# Patient Record
Sex: Male | Born: 1965 | Race: White | Hispanic: No | Marital: Married | Smoking: Never smoker
Health system: Southern US, Community
[De-identification: ages and names within clinical notes are randomized; demographics above are authoritative.]

## PROBLEM LIST (undated history)

## (undated) DIAGNOSIS — R0789 Other chest pain: Secondary | ICD-10-CM

## (undated) DIAGNOSIS — K602 Anal fissure, unspecified: Secondary | ICD-10-CM

## (undated) DIAGNOSIS — Z9889 Other specified postprocedural states: Secondary | ICD-10-CM

## (undated) DIAGNOSIS — Z789 Other specified health status: Secondary | ICD-10-CM

## (undated) HISTORY — PX: COLONOSCOPY: SHX174

## (undated) HISTORY — DX: Anal fissure, unspecified: K60.2

## (undated) HISTORY — PX: OTHER SURGICAL HISTORY: SHX169

## (undated) HISTORY — DX: Other specified postprocedural states: Z98.890

## (undated) HISTORY — PX: UPPER GASTROINTESTINAL ENDOSCOPY: SHX188

---
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
## 2001-03-24 ENCOUNTER — Ambulatory Visit (HOSPITAL_COMMUNITY): Admission: RE | Admit: 2001-03-24 | Discharge: 2001-03-24 | Payer: Self-pay | Admitting: Internal Medicine

## 2001-03-24 ENCOUNTER — Encounter: Payer: Self-pay | Admitting: Internal Medicine

## 2001-04-19 ENCOUNTER — Ambulatory Visit (HOSPITAL_COMMUNITY): Admission: RE | Admit: 2001-04-19 | Discharge: 2001-04-19 | Payer: Self-pay | Admitting: Internal Medicine

## 2001-08-09 ENCOUNTER — Encounter: Payer: Self-pay | Admitting: Internal Medicine

## 2001-08-09 ENCOUNTER — Ambulatory Visit (HOSPITAL_COMMUNITY): Admission: RE | Admit: 2001-08-09 | Discharge: 2001-08-09 | Payer: Self-pay | Admitting: Internal Medicine

## 2002-03-28 ENCOUNTER — Encounter: Payer: Self-pay | Admitting: Internal Medicine

## 2002-03-28 ENCOUNTER — Ambulatory Visit (HOSPITAL_COMMUNITY): Admission: RE | Admit: 2002-03-28 | Discharge: 2002-03-28 | Payer: Self-pay | Admitting: Internal Medicine

## 2002-04-06 ENCOUNTER — Encounter: Payer: Self-pay | Admitting: Internal Medicine

## 2002-04-06 ENCOUNTER — Ambulatory Visit (HOSPITAL_COMMUNITY): Admission: RE | Admit: 2002-04-06 | Discharge: 2002-04-06 | Payer: Self-pay | Admitting: Internal Medicine

## 2004-08-30 ENCOUNTER — Ambulatory Visit (HOSPITAL_COMMUNITY): Admission: RE | Admit: 2004-08-30 | Discharge: 2004-08-30 | Payer: Self-pay | Admitting: Family Medicine

## 2007-10-05 ENCOUNTER — Ambulatory Visit (HOSPITAL_COMMUNITY): Admission: RE | Admit: 2007-10-05 | Discharge: 2007-10-05 | Payer: Self-pay | Admitting: Family Medicine

## 2010-07-12 NOTE — Op Note (Signed)
[facility] ([place])  Patient:    Justin Estrada, Justin Estrada Visit Number: [id number] MRN: [id number]          Service Type: END Location: DAY Attending Physician:  Jonathon Bellows Dictated by:   Roetta Sessions, M.D. Proc. Date: 04/19/01 Admit Date:  04/19/2001                             Operative Report  PROCEDURE:  Diagnostic esophagogastroduodenoscopy.  ENDOSCOPIST:  Roetta Sessions, M.D.  INDICATION FOR PROCEDURE:  Patient is a 45 year old gentleman with intermittent gnawing epigastric and right upper quadrant fullness.  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  Prior gallbladder EF was in the neighborhood of 75%.  He has not gotten better with OTC H2 blocker therapy.  EGD is now being done to further evaluate his symptoms.  This approach was been discussed with patient at the bedside.  Potential risks, benefits and alternatives have been reviewed and questions answered.  I feel he is low risk for conscious sedation with Versed and Demerol.  Please see my handwritten H&P and the office documentation for more information.  DESCRIPTION OF PROCEDURE:  O2 saturation, blood pressure, pulse and respirations were monitored throughout the entirety of the procedure.  CONSCIOUS SEDATION:  Versed 2 mg IV and Demerol 50 mg IV.  Cetacaine spray for topical oropharyngeal anesthesia.  INSTRUMENT:  Olympus video chip diagnostic gastroscope.  FINDINGS:  Examination of the tubular esophagus revealed no mucosal abnormalities.  EG junction was easily traversed.  Stomach:  The gastric cavity insufflated well with air.  Thorough examination of the gastric mucosa including a retroflexed view of the proximal stomach and esophagogastric junction demonstrated no abnormalities.  Pylorus was patent and easily traversed.  Duodenum:  The bulb and second portion appeared normal.  THERAPEUTIC/DIAGNOSTIC MANEUVERS  PERFORMED:  None.  The patient tolerated the procedure well and was reacted in endoscopy.  IMPRESSION:  Normal esophagus, stomach and duodenum through the second portion.  No endoscopic explanation for patients symptoms.  RECOMMENDATIONS: 1. The patient will need a repeat right upper quadrant ultrasound in three    months to reassess the probable hemangioma seen on prior ultrasound. 2. Would like to give him a course of more aggressive acid-suppression therapy    to see if this has any positive impact on his symptoms.  It may well be    that gallbladder disease is found to ultimately be responsible for his    symptoms. 3. Aciphex 20 mg orally daily for the next month. 4. We will see him back in the office in four weeks and go from there. Dictated by:   Roetta Sessions, M.D. Attending Physician:  Jonathon Bellows DD:  04/19/01 TD:  04/19/01 Job: [id number]

## 2010-07-12 NOTE — Procedures (Signed)
NAME:  Justin Estrada, Justin Estrada NO.:  [id number]   MEDICAL RECORD NO.:  [id number]          PATIENT TYPE:  OUT   LOCATION:  [facility]                          FACILITY:  [facility]   PHYSICIAN:  Scott A. Gerda Diss, MD    DATE OF BIRTH:  August 13, 1965   DATE OF PROCEDURE:  08/30/2004  DATE OF DISCHARGE:                                    STRESS TEST   INDICATIONS FOR PROCEDURE:  Chest discomfort with family history.  Resting  EKG with no acute ST segment changes noted, normal sinus rhythm.   PROTOCOL:  Bruce protocol.   HEART RATE RESPONSE:  The patient's heart rate gradually went up and hit his  target heart rate at the first phase of stage III.  Hit a maximum heart rate  in about the 170s.  The computer said 200, but this was due to artifact.   ST SEGMENT RESPONSE:  The patient did not have any acute ST segment  depressions.  There was a lot of artifact as he went further into his stress  test.  Even at the first recovery EKG, where his heart rate was 161, there  was no acute ST segment depressions.  The patient did have any chest  pressure tightness or discomfort with activity.   RECOVERY PHASE:  Uneventful.  No arrhythmias.   BLOOD PRESSURE RESPONSE:  The patient had a mild increase in blood pressure,  but was not considered to be excessive or hypertensive.   INTERPRETATION:  Normal stress test.  No acute ST segment changes.  He is  approved for regular exercise to help maintain his overall health.  He was  cautioned regarding avoiding excess strenuous activity without training into  it and also his cholesterol for which lab work he brought with him today  looks very good, but a low HDL for which he was advised dietary measures as  well as exercise.  Recheck it again at least on a yearly basis, but  currently does not need medication.       SAL/MEDQ  D:  08/30/2004  T:  08/30/2004  Job:  [id number]

## 2011-01-24 ENCOUNTER — Other Ambulatory Visit: Payer: Self-pay | Admitting: Family Medicine

## 2011-01-28 ENCOUNTER — Ambulatory Visit (HOSPITAL_COMMUNITY)
Admission: RE | Admit: 2011-01-28 | Discharge: 2011-01-28 | Disposition: A | Discharge status: Home / Self Care | Payer: BC Managed Care – PPO | Source: Ambulatory Visit | Attending: Family Medicine | Admitting: Family Medicine

## 2011-01-28 ENCOUNTER — Other Ambulatory Visit: Payer: Self-pay | Admitting: Family Medicine

## 2011-01-28 DIAGNOSIS — R1011 Right upper quadrant pain: Secondary | ICD-10-CM | POA: Insufficient documentation

## 2011-02-11 ENCOUNTER — Other Ambulatory Visit: Payer: Self-pay | Admitting: Family Medicine

## 2011-02-11 DIAGNOSIS — R1011 Right upper quadrant pain: Secondary | ICD-10-CM

## 2011-02-14 ENCOUNTER — Encounter (HOSPITAL_COMMUNITY)
Admission: RE | Admit: 2011-02-14 | Discharge: 2011-02-14 | Disposition: A | Discharge status: Home / Self Care | Payer: BC Managed Care – PPO | Source: Ambulatory Visit | Attending: Family Medicine | Admitting: Family Medicine

## 2011-02-14 ENCOUNTER — Encounter (HOSPITAL_COMMUNITY): Payer: Self-pay

## 2011-02-14 DIAGNOSIS — R1011 Right upper quadrant pain: Secondary | ICD-10-CM | POA: Insufficient documentation

## 2011-02-14 MED ORDER — SINCALIDE 5 MCG IJ SOLR
0.0200 ug/kg | Freq: Once | INTRAMUSCULAR | Status: AC
  Administered 2011-02-14: 2.05 ug via INTRAVENOUS

## 2011-02-14 MED ORDER — TECHNETIUM TC 99M MEBROFENIN IV KIT
5.0000 | PACK | Freq: Once | INTRAVENOUS | Status: AC | PRN
  Administered 2011-02-14: 4.7 via INTRAVENOUS

## 2011-02-14 MED ORDER — SINCALIDE 5 MCG IJ SOLR
INTRAMUSCULAR | Status: AC
  Administered 2011-02-14: 2.05 ug via INTRAVENOUS
  Filled 2011-02-14: qty 5

## 2011-08-04 ENCOUNTER — Ambulatory Visit (INDEPENDENT_AMBULATORY_CARE_PROVIDER_SITE_OTHER): Payer: BC Managed Care – PPO | Admitting: Internal Medicine

## 2011-08-04 ENCOUNTER — Encounter (INDEPENDENT_AMBULATORY_CARE_PROVIDER_SITE_OTHER): Payer: Self-pay | Admitting: Internal Medicine

## 2011-08-04 VITALS — BP 126/60 | HR 66 | Temp 97.7°F | Ht 71.0 in | Wt 230.3 lb

## 2011-08-04 DIAGNOSIS — R52 Pain, unspecified: Secondary | ICD-10-CM

## 2011-08-04 DIAGNOSIS — R1011 Right upper quadrant pain: Secondary | ICD-10-CM | POA: Insufficient documentation

## 2011-08-04 NOTE — Progress Notes (Signed)
Subjective:     Patient ID: Justin Estrada, male   DOB: 05/24/1965, 46 y.o.   MRN: [id number]  HPI Referred to our office by Dr. Gerda Diss for pain  In rt upper quadrant pain.  Pain worse after eating fried foods.  He describes this as discomfort. Pain will radiate into back and under scapular.  Says it is usually with fried foods or if he overeats. He denies acid reflux. Appetite is good. No weight loss. No lower abdominal pain.  BMs x 1 a day. No melena or bright red rectal bleeding.  No change in stools.    EGD in 2003 by Dr Jena Gauss for same: The patient tolerated the procedure well and was reacted in endoscopy.  IMPRESSION: Normal esophagus, stomach and duodenum through the second  portion.  No endoscopic explanation for patients symptoms.     [place] 12/2010 US abdomen: Clinical Data: Right upper quadrant pain  GALLBLADDER ULTRASOUND  Comparison: None  Findings:  Gallbladder: No gallstones, gallbladder wall thickening, or  pericholecystic fluid. Negative sonographic Murphy's sign.  Common Bile Duct: Within normal limits in caliber.  Liver: Non mass-like area of increased echogenicity in the right  hepatic lobe likely represents fatty infiltration.  IMPRESSION: No evidence for acute cholecystitis.  Original Report Authenticated By: Rosealee Albee, M.D.     HIDA scan normal in November of 2012.  05/01/2010 Total bili 0.6, ALP 75, AST 16, ALT 20.    Review of Systems see hpi No current outpatient prescriptions on file.   History reviewed. No pertinent past medical history. Past Surgical History  Procedure Date  . Foot surgery  for bone spurs    Justin Estrada does not currently have medications on file. History   Social History Narrative  . No narrative on file   Family Status  Relation Status Death Age  . Mother Alive     good health  . Father Alive     CAD  . Brother Alive     good health        Objective:   Physical Exam Filed Vitals:   08/04/11 0959  Height: 5'  11" (1.803 m)  Weight: 230 lb 4.8 oz (104.463 kg)  Alert and oriented. Skin warm and dry. Oral mucosa is moist.   . Sclera anicteric, conjunctivae is pink. Thyroid not enlarged. No cervical lymphadenopathy. Lungs clear. Heart regular rate and rhythm.  Abdomen is soft. Bowel sounds are positive. No hepatomegaly. No abdominal masses felt. No tenderness.  No edema to lower extremities. Patient is alert and oriented.       Assessment:   Rt upper quadrant pain for greater than 10 yrs. EGD, HIDA scan and [place] in past have all been normal.  I discussed this case with Dr Karilyn Cota. Gallbladder disease needs to be ruled out.    Plan:    HIDA scan.

## 2011-08-04 NOTE — Patient Instructions (Signed)
HIDA scan.

## 2011-08-04 NOTE — Progress Notes (Signed)
Addended by: Len Blalock on: 08/04/2011 10:44 AM   Modules accepted: Level of Service

## 2011-08-05 ENCOUNTER — Encounter (INDEPENDENT_AMBULATORY_CARE_PROVIDER_SITE_OTHER): Payer: Self-pay

## 2011-08-06 ENCOUNTER — Encounter (HOSPITAL_COMMUNITY)
Admission: RE | Admit: 2011-08-06 | Discharge: 2011-08-06 | Disposition: A | Discharge status: Home / Self Care | Payer: BC Managed Care – PPO | Source: Ambulatory Visit | Attending: Internal Medicine | Admitting: Internal Medicine

## 2011-08-06 ENCOUNTER — Encounter (HOSPITAL_COMMUNITY): Payer: Self-pay

## 2011-08-06 DIAGNOSIS — R1011 Right upper quadrant pain: Secondary | ICD-10-CM

## 2011-08-06 MED ORDER — TECHNETIUM TC 99M MEBROFENIN IV KIT
5.0000 | PACK | Freq: Once | INTRAVENOUS | Status: AC | PRN
Start: 2011-08-06 — End: 2011-08-06
  Administered 2011-08-06: 5 via INTRAVENOUS

## 2011-08-06 MED ORDER — SINCALIDE 5 MCG IJ SOLR
INTRAMUSCULAR | Status: AC
  Administered 2011-08-06: 2.09 ug via INTRAVENOUS
  Filled 2011-08-06: qty 5

## 2012-02-24 ENCOUNTER — Encounter (INDEPENDENT_AMBULATORY_CARE_PROVIDER_SITE_OTHER): Payer: Self-pay | Admitting: Internal Medicine

## 2012-02-24 ENCOUNTER — Ambulatory Visit (INDEPENDENT_AMBULATORY_CARE_PROVIDER_SITE_OTHER): Payer: BC Managed Care – PPO | Admitting: Internal Medicine

## 2012-02-24 VITALS — BP 130/78 | HR 74 | Temp 97.0°F | Resp 18 | Ht 71.0 in | Wt 235.3 lb

## 2012-02-24 DIAGNOSIS — K602 Anal fissure, unspecified: Secondary | ICD-10-CM | POA: Insufficient documentation

## 2012-02-24 MED ORDER — DILTIAZEM GEL 2 %: 1.0000 "application " | Freq: Two times a day (BID) | CUTANEOUS | Status: DC

## 2012-02-24 NOTE — Progress Notes (Signed)
Presenting complaint;  Painful defecation; raw area involving anal canal.  Subjective:  Justin Estrada is 46 year old Caucasian male who was last seen in June 2013 for right upper quadrant pain but workup was negative for biliary tract disease. Ultrasound was negative for cholelithiasis and HIDA scan was normal. Now he comes in with new complaint. For the last few weeks he has noted dry area involving his anal canal and he has painful defecation. He does not recall the day the symptoms started. He was last treated by me for more or less similar symptoms 3 years ago in [facility] [place](these records are not available). He denies diarrhea and/or constipation. He has sporadic hematochezia. He denies frank rectal bleeding melena abdominal pain. He still having intermittent pain under the right rib cage going posteriorly. He is having this pain less frequently and does not last too long. It des not appear to be associated with meals.  Current Medications: No current outpatient prescriptions on file.     Objective: Blood pressure 130/78, pulse 74, temperature 97 F (36.1 C), temperature source Oral, resp. rate 18, height 5\' 11"  (1.803 m), weight 235 lb 4.8 oz (106.731 kg). Conjunctiva is pink. Sclera is nonicteric Oropharyngeal mucosa is normal. No neck masses or thyromegaly noted. Abdomen is full. It is soft and nontender without organomegaly or masses. Rectal examination reveals a tiny, slender skin tacked posteriorly. He has significant tenderness on digital exam which was limited. Mucosa on the glove was heme-positive. No LE edema or clubbing noted.    Assessment:  #1. Symptoms are typical for anal fissure. Luckily he's not having excruciating pain. He does not have chronic diarrhea or other symptoms to suggest IBD. #2. Intermittent right upper quadrant pain. Workup negative for biliary tract disease. It may be musculoskeletal. Will continue to monitor.   Plan:  Continue high  fiber. Diltiazem 2% gel; topical application anal canal twice a day. Progress report in 4 weeks. Office visit in 8 weeks.

## 2012-02-24 NOTE — Patient Instructions (Addendum)
Continue high fiber diet. Call office with progress report in 4 weeks. Heme occult x1.

## 2012-03-30 ENCOUNTER — Encounter (INDEPENDENT_AMBULATORY_CARE_PROVIDER_SITE_OTHER): Payer: Self-pay

## 2012-03-30 ENCOUNTER — Ambulatory Visit (INDEPENDENT_AMBULATORY_CARE_PROVIDER_SITE_OTHER): Payer: BC Managed Care – PPO | Admitting: Internal Medicine

## 2012-04-10 ENCOUNTER — Other Ambulatory Visit: Payer: Self-pay

## 2012-04-20 ENCOUNTER — Ambulatory Visit (INDEPENDENT_AMBULATORY_CARE_PROVIDER_SITE_OTHER): Payer: BC Managed Care – PPO | Admitting: Internal Medicine

## 2012-04-20 ENCOUNTER — Encounter (INDEPENDENT_AMBULATORY_CARE_PROVIDER_SITE_OTHER): Payer: Self-pay | Admitting: Internal Medicine

## 2012-04-20 VITALS — BP 122/78 | HR 74 | Temp 97.0°F | Resp 20 | Ht 71.0 in | Wt 233.8 lb

## 2012-04-20 DIAGNOSIS — K602 Anal fissure, unspecified: Secondary | ICD-10-CM

## 2012-04-20 MED ORDER — DOCUSATE SODIUM 100 MG PO CAPS: 100.0000 mg | ORAL_CAPSULE | Freq: Every day | ORAL | Status: DC

## 2012-04-20 MED ORDER — PSYLLIUM 28 % PO PACK: 1.0000 | PACK | Freq: Every day | ORAL | Status: AC

## 2012-04-20 NOTE — Patient Instructions (Signed)
Discontinue diltiazem gel after 2-3 weeks. Keep symptom diary and call office with progress report in 3 months.

## 2012-04-20 NOTE — Progress Notes (Signed)
Presenting complaint;  Followup for fissure in ano.  Subjective:  Patient is 47 year old Caucasian male who was last seen 8 weeks ago for symptoms secondary to fissure in ano. He was begun on diltiazem gel. He had complete relief of his symptoms for the first 2-3 weeks and now he is having intermittent discomfort with defecation. Discomfort occurs primarily when his stool is somewhat dry or hard. He denies rectal bleeding or discharge. He has had few episodes of right upper quadrant abdominal pain primarily when he stoops forward. He is not having any side effects with diltiazem gel.  Current Medications: Current Outpatient Prescriptions  Medication Sig Dispense Refill  . diltiazem 2 % GEL Apply 1 application topically 2 (two) times daily.  60 g  0   No current facility-administered medications for this visit.     Objective: Blood pressure 122/78, pulse 74, temperature 97 F (36.1 C), temperature source Oral, resp. rate 20, height 5\' 11"  (1.803 m), weight 233 lb 12.8 oz (106.051 kg). Patient is alert and in no acute distress. Conjunctiva is pink. Sclera is nonicteric Oropharyngeal mucosa is normal. No neck masses or thyromegaly noted. Abdomen is full but soft and nontender without organomegaly or masses. No LE edema or clubbing noted.   Assessment:  Fissure in ano. Significant but incomplete response to topical therapy with calcium channel blocker. This may be because he is somewhat constipated.   Plan:  Colace 200 mg by mouth each bedtime. Results 3-4 g by mouth daily. Discontinue diltiazem gel after 2-3 weeks. Progress report in 3 months.

## 2012-07-22 ENCOUNTER — Encounter: Payer: Self-pay | Admitting: Family Medicine

## 2012-07-22 ENCOUNTER — Ambulatory Visit (INDEPENDENT_AMBULATORY_CARE_PROVIDER_SITE_OTHER): Payer: BC Managed Care – PPO | Admitting: Family Medicine

## 2012-07-22 VITALS — BP 114/80 | Temp 97.9°F | Ht 71.0 in | Wt 236.0 lb

## 2012-07-22 DIAGNOSIS — J019 Acute sinusitis, unspecified: Secondary | ICD-10-CM

## 2012-07-22 MED ORDER — HYDROCODONE-HOMATROPINE 5-1.5 MG/5ML PO SYRP: ORAL_SOLUTION | ORAL | Status: DC

## 2012-07-22 MED ORDER — LEVOFLOXACIN 500 MG PO TABS: 500.0000 mg | ORAL_TABLET | Freq: Every day | ORAL | Status: AC

## 2012-07-22 NOTE — Progress Notes (Signed)
  Subjective:    Patient ID: [name], male    DOB: 09-11-1965, 47 y.o.   MRN: [id number]  Sinusitis This is a new problem. The current episode started in the past 7 days. The problem is unchanged. There has been no fever. His pain is at a severity of 5/10. The pain is moderate. Associated symptoms include congestion, coughing, sinus pressure and a sore throat. Treatments tried: otc allergy medicine. The treatment provided no relief.   pmh benign  Not a smoker   Review of Systems  HENT: Positive for congestion, sore throat and sinus pressure.   Respiratory: Positive for cough.        Objective:   Physical Exam  Vitals reviewed. Constitutional: He appears well-nourished.  HENT:  Head: Atraumatic.  Cardiovascular: Normal rate, regular rhythm and normal heart sounds.   No murmur heard. Pulmonary/Chest: Effort normal and breath sounds normal. No respiratory distress.  Lymphadenopathy:    He has no cervical adenopathy.          Assessment & Plan:  acue sinusitis - hycodan at night, levaquin 500 10 days, fu if worse

## 2012-09-09 ENCOUNTER — Ambulatory Visit (INDEPENDENT_AMBULATORY_CARE_PROVIDER_SITE_OTHER): Payer: BC Managed Care – PPO | Admitting: Family Medicine

## 2012-09-09 ENCOUNTER — Ambulatory Visit (HOSPITAL_COMMUNITY)
Admission: RE | Admit: 2012-09-09 | Discharge: 2012-09-09 | Disposition: A | Discharge status: Home / Self Care | Payer: BC Managed Care – PPO | Source: Ambulatory Visit | Attending: Family Medicine | Admitting: Family Medicine

## 2012-09-09 ENCOUNTER — Encounter: Payer: Self-pay | Admitting: Family Medicine

## 2012-09-09 VITALS — BP 120/82 | Temp 97.6°F | Wt 241.5 lb

## 2012-09-09 DIAGNOSIS — M545 Low back pain, unspecified: Secondary | ICD-10-CM | POA: Insufficient documentation

## 2012-09-09 DIAGNOSIS — G8929 Other chronic pain: Secondary | ICD-10-CM

## 2012-09-09 MED ORDER — DICLOFENAC SODIUM 75 MG PO TBEC: 75.0000 mg | DELAYED_RELEASE_TABLET | Freq: Two times a day (BID) | ORAL | Status: DC | PRN

## 2012-09-09 NOTE — Progress Notes (Signed)
  Subjective:    Patient ID: Justin Estrada, male    DOB: [birth year], 47 y.o.   MRN: [id number]  Back Pain This is a chronic problem. The current episode started more than 1 month ago. The problem occurs 2 to 4 times per day. The pain is present in the lumbar spine. The quality of the pain is described as aching. The pain is at a severity of 4/10. The pain is moderate. The pain is worse during the day. The symptoms are aggravated by bending. Stiffness is present in the morning.    Has tried local cream, helps some  Took antiinflam med helped a bit.  Back acts up with exercise. Tired and tight at times  Massage therapy. fam hx of ruptured disc  no reg exercise. Review of Systems  Musculoskeletal: Positive for back pain.   ROS otherwise negative.     Objective:   Physical Exam  Alert no acute distress. Lungs clear. Heart regular rate and rhythm. Left lower lumbar region tender to palpation. Negative spine tenderness. Negative sciatic notch tenderness. Hip good range of motion. Distal legs sensation strength reflexes all intact.      Assessment & Plan:  Impression chronic lumbar strain now worsening. Discussed at length. Plan 1 time physical therapy consult for instruction. Voltaren 75 twice a day with food when necessary for pain. During acute flares. Low back x-rays. Easily 25 minutes spent most in discussion. WSL

## 2012-09-15 ENCOUNTER — Ambulatory Visit (HOSPITAL_COMMUNITY)
Admission: RE | Admit: 2012-09-15 | Discharge: 2012-09-15 | Disposition: A | Discharge status: Home / Self Care | Payer: BC Managed Care – PPO | Source: Ambulatory Visit | Attending: Family Medicine | Admitting: Family Medicine

## 2012-09-15 DIAGNOSIS — IMO0001 Reserved for inherently not codable concepts without codable children: Secondary | ICD-10-CM | POA: Insufficient documentation

## 2012-09-15 DIAGNOSIS — M545 Low back pain, unspecified: Secondary | ICD-10-CM | POA: Insufficient documentation

## 2012-09-15 NOTE — Evaluation (Signed)
Physical Therapy Evaluation  Patient Details  Name: Justin Estrada MRN: [id number] Date of Birth: [birth year] Charge evaluation Today's Date: 09/15/2012 Time: 1520-1600 PT Time Calculation (min): 40 min              Visit#: 1 of 1  Re-eval:   Assessment Diagnosis: Low back pain Prior Therapy: none Past Medical History: No past medical history on file. Past Surgical History:  Past Surgical History  Procedure Laterality Date  . Foot surgery  for bone spurs    . Colonoscopy      10 plus years ago by Dr.Rourk  . Upper gastrointestinal endoscopy      10 plus years ago by Dr.Rourk    Subjective Symptoms/Limitations Symptoms: Mr. Uddin states that his low back has been bothering him for a long time but in the last month the pain has occured on a daily basis.  The pain occures mainly when he is bending.  He states his entire back feels achey and tired.  Pt denies radicular paiin. 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 Leisure: Hobbies-yes (Comment) Comments: coach baseball,   Sensation/Coordination/Flexibility/Functional Tests Flexibility Thomas: Positive 90/90: Positive  Assessment RLE Strength RLE Overall Strength: Within Functional Limits for tasks assessed LLE Strength LLE Overall Strength: Within Functional Limits for tasks assessed Lumbar AROM Lumbar Flexion:  (done with hip flex minimal lumbar motion) Lumbar Extension: wnl Lumbar - Right Side Bend: wnl Lumbar - Left Side Bend: wnl Lumbar - Right Rotation: wnl Lumbar - Left Rotation: wnl  Exercise/Treatments Mobility/Balance  Posture/Postural Control Posture/Postural Control: Postural limitations Postural Limitations: decreased kyphosis incrased lordosis    Stretches Active Hamstring Stretch: 3 reps;30 seconds Single Knee to Chest Stretch: 1 rep;30 seconds Supine Ab Set: 5 reps Dead Bug: 5 reps Bridge: 5 reps Straight Leg Raise: 5 reps Prone  Straight Leg Raise: 5 reps Opposite Arm/Leg Raise: 5 reps   Physical Therapy Assessment and Plan PT Assessment and Plan Clinical Impression Statement: Pt is a 47 yo male with progressive chronic lumbar pain.  Pt has been referred for a one time evaluation. Pt was educated in the importance of posture and body mechanics in low back pain as well as stretches and stab exercises. PT Plan: Pt is a one time visit     Goals Home Exercise Program Pt/caregiver will Perform Home Exercise Program: For increased strengthening;For increased ROM PT Goal: Perform Home Exercise Program - Progress: Goal set today  Problem List Patient Active Problem List   Diagnosis Date Noted  . Chronic low back pain 09/09/2012  . Fissure in ano 02/24/2012  . Abdominal pain, acute, right upper quadrant 08/04/2011    PT Plan of Care PT Home Exercise Plan: given  GP    Ajai Terhaar,CINDY 09/15/2012, 4:03 PM  Physician Documentation Your signature is required to indicate approval of the treatment plan as stated above.  Please sign and either send electronically or make a copy of this report for your files and return this physician signed original.   Please mark one 1.__approve of plan  2. ___approve of plan with the following conditions.   ______________________________  _____________________ Physician Signature                                                                                                             Date

## 2012-11-22 ENCOUNTER — Encounter (INDEPENDENT_AMBULATORY_CARE_PROVIDER_SITE_OTHER): Payer: Self-pay | Admitting: Internal Medicine

## 2012-11-22 ENCOUNTER — Ambulatory Visit (INDEPENDENT_AMBULATORY_CARE_PROVIDER_SITE_OTHER): Payer: BC Managed Care – PPO | Admitting: Internal Medicine

## 2012-11-22 VITALS — BP 118/80 | HR 72 | Temp 97.9°F | Resp 18 | Ht 71.0 in | Wt 233.5 lb

## 2012-11-22 DIAGNOSIS — K602 Anal fissure, unspecified: Secondary | ICD-10-CM

## 2012-11-22 DIAGNOSIS — K5900 Constipation, unspecified: Secondary | ICD-10-CM

## 2012-11-22 MED ORDER — DILTIAZEM GEL 2 %: 1.0000 "application " | Freq: Every day | CUTANEOUS | Status: DC

## 2012-11-22 NOTE — Progress Notes (Signed)
Presenting complaint;  Followup for anal fissure and constipation.  Subjective:  Justin Estrada is a 47 year old Caucasian male who is here for scheduled visit. He was seen in December 2013 for anal fissure and reevaluated in February 2014. He is doing much better. He has very few days when he is constipated or passes hard or dry stool. He spear some pain in her hematochezia and he is constipated. He has good appetite. He is trying to walk few times each week. Last colonoscopy was in 2003.  Current Medications: Current Outpatient Prescriptions  Medication Sig Dispense Refill  . diclofenac (VOLTAREN) 75 MG EC tablet Take 1 tablet (75 mg total) by mouth 2 (two) times daily as needed.  30 tablet  1  . docusate sodium (COLACE) 100 MG capsule Take 1 capsule (100 mg total) by mouth at bedtime.    0  . psyllium (METAMUCIL SMOOTH TEXTURE) 28 % packet Take 1 packet by mouth at bedtime.       No current facility-administered medications for this visit.     Objective: Blood pressure 118/80, pulse 72, temperature 97.9 F (36.6 C), temperature source Oral, resp. rate 18, height 5\' 11"  (1.803 m), weight 233 lb 8 oz (105.915 kg). Conjunctiva is pink. Sclera is nonicteric Oropharyngeal mucosa is normal. No neck masses or thyromegaly noted. Abdomen is full but soft and nontender without organomegaly or masses. Rectal examination was limited to external inspection revealing 2 small anal tags posteriorly on the right side. No ulcer or fissure noted. No LE edema or clubbing noted.   Assessment:  #1. History of anal fissure. He is still having some discomfort and hematochezia and his stool is hard. Overall he is doing much better. He needs to make sure he's never constipated. #2. Constipation. He is doing well with high fiber diet, Metamucil and Colace.   Plan:  New prescription given for diltiazem gel 2% 30 g with one refill he will use on an as-needed basis. Office visit on an as-needed basis. Will  consider colonoscopy when he turns 51.

## 2012-11-22 NOTE — Patient Instructions (Signed)
Continue high fiber diet and fiber supplement. Use diltiazem gel on an as-needed basis as directed

## 2012-12-30 ENCOUNTER — Other Ambulatory Visit: Payer: Self-pay

## 2013-03-21 ENCOUNTER — Encounter: Payer: Self-pay | Admitting: Family Medicine

## 2013-03-21 ENCOUNTER — Ambulatory Visit (INDEPENDENT_AMBULATORY_CARE_PROVIDER_SITE_OTHER): Payer: BC Managed Care – PPO | Admitting: Family Medicine

## 2013-03-21 VITALS — BP 138/84 | Ht 71.0 in | Wt 238.0 lb

## 2013-03-21 DIAGNOSIS — M545 Low back pain, unspecified: Secondary | ICD-10-CM

## 2013-03-21 DIAGNOSIS — G8929 Other chronic pain: Secondary | ICD-10-CM

## 2013-03-21 MED ORDER — DICLOFENAC SODIUM 75 MG PO TBEC: 75.0000 mg | DELAYED_RELEASE_TABLET | Freq: Two times a day (BID) | ORAL | Status: DC | PRN

## 2013-03-21 NOTE — Progress Notes (Signed)
   Subjective:    Patient ID: Justin Estrada, male    DOB: 03/15/1965, 48 y.o.   MRN: [id number]  Back Pain The current episode started more than 1 month ago. The pain is present in the lumbar spine and gluteal. The symptoms are aggravated by bending and sitting. Associated symptoms include tingling. Treatments tried: icy hot, ibuprofen. The treatment provided mild relief.   Pain in groin on right side. Started about 3 weeks ago.    Review of Systems  Musculoskeletal: Positive for back pain.  Neurological: Positive for tingling.       Objective:   Physical Exam Patient has tenderness in the left lower spine area.  Vital area near the L1-L2 region. Negative straight leg raise. Strength is good. Abdomen soft no guarding rebound or tenderness no hernia detected.       Assessment & Plan:  #1 lower back pain discomfort I feel that this is related to possibility of a pinched nerve patient has had this for a good 6 months progressively worse he defers MRI at this point. I doubt surgery necessary. May need an injection that might help him. But for the most part this hopefully will get better by using Voltaren on a regular basis he understands if he starts causing stomach discomfort or bleeding in the stools he is to stop. His are seen a physical therapist he knows about exercises.  Wellness exam recommended later this year  No sign of a hernia. More than likely a strain of the groin ligament

## 2013-04-04 ENCOUNTER — Ambulatory Visit (INDEPENDENT_AMBULATORY_CARE_PROVIDER_SITE_OTHER): Payer: BC Managed Care – PPO | Admitting: Podiatry

## 2013-04-04 ENCOUNTER — Encounter: Payer: Self-pay | Admitting: Podiatry

## 2013-04-04 ENCOUNTER — Ambulatory Visit (INDEPENDENT_AMBULATORY_CARE_PROVIDER_SITE_OTHER): Payer: BC Managed Care – PPO

## 2013-04-04 VITALS — BP 169/82 | HR 64 | Resp 12

## 2013-04-04 DIAGNOSIS — M7750 Other enthesopathy of unspecified foot: Secondary | ICD-10-CM

## 2013-04-04 DIAGNOSIS — R52 Pain, unspecified: Secondary | ICD-10-CM

## 2013-04-04 MED ORDER — TRIAMCINOLONE ACETONIDE 10 MG/ML IJ SUSP
10.0000 mg | Freq: Once | INTRAMUSCULAR | Status: AC
  Administered 2013-04-04: 10 mg

## 2013-04-04 NOTE — Progress Notes (Signed)
   Subjective:    Patient ID: Justin Estrada, male    DOB: 07/05/1965, 48 y.o.   MRN: [id number]  HPI Comments: '' THE OUTSIDE OF THE FEET ARE HURTING AND SWOLLEN FOR 3 WEEKS AND ITS GETTING WORSE. TRIED NO TREATMENT.''  Foot Pain      Review of Systems     Objective:   Physical Exam        Assessment & Plan:

## 2013-04-05 NOTE — Progress Notes (Signed)
Subjective:     Patient ID: Justin Estrada, male   DOB: 08/13/1965, 48 y.o.   MRN: [id number]  HPI patient states to the outside of the left foot has been bothering him and also now it is starting to do it a little bit on the right foot. Also states he's had a history of trouble with his Achilles tendon that flares up occasionally   Review of Systems     Objective:   Physical Exam Neurovascular status intact with significant discomfort at the insertion of peroneal brevis into the base of the fifth metatarsal with fluid buildup around the left foot and mildly on the right foot. No indication of tendon damage and the tendon is functioning well    Assessment:     Acute tendinitis left over right with possibility of systemic issues    Plan:     Reviewed x-ray and did careful injection left peroneal brevis insertion 3 mg Kenalog 5 of Xylocaine Marcaine mixture and discussed we may do blood work if symptoms persist

## 2013-04-20 ENCOUNTER — Encounter: Payer: Self-pay | Admitting: Family Medicine

## 2013-04-20 ENCOUNTER — Ambulatory Visit (INDEPENDENT_AMBULATORY_CARE_PROVIDER_SITE_OTHER): Payer: BC Managed Care – PPO | Admitting: Family Medicine

## 2013-04-20 VITALS — BP 130/82 | Ht 71.0 in | Wt 243.2 lb

## 2013-04-20 DIAGNOSIS — Z23 Encounter for immunization: Secondary | ICD-10-CM

## 2013-04-20 DIAGNOSIS — Z0000 Encounter for general adult medical examination without abnormal findings: Secondary | ICD-10-CM

## 2013-04-20 NOTE — Progress Notes (Signed)
   Subjective:    Patient ID: Justin Estrada, male    DOB: 08/04/1965, 48 y.o.   MRN: [id number]  HPI Patient arrives for an annual physical. Patient reports no problems or concerns at this time.  Patient does try to keep somewhat healthy but he states he can do much better job he also does try to exercise a little but he has not past few months he states he gets a fair amount of sleep he denies poor driving habits denies rectal bleeding vomiting diarrhea denies chest pain shortness of breath past medical history other than some low back pain and intermittent abdominal discomfort he is fine Review of Systems  Constitutional: Negative for fever, activity change and appetite change.  HENT: Negative for congestion and rhinorrhea.   Eyes: Negative for discharge.  Respiratory: Negative for cough and wheezing.   Cardiovascular: Negative for chest pain.  Gastrointestinal: Negative for vomiting, abdominal pain and blood in stool.  Genitourinary: Negative for frequency and difficulty urinating.  Musculoskeletal: Negative for neck pain.  Skin: Negative for rash.  Allergic/Immunologic: Negative for environmental allergies and food allergies.  Neurological: Negative for weakness and headaches.  Psychiatric/Behavioral: Negative for agitation.       Objective:   Physical Exam  Constitutional: He appears well-developed and well-nourished.  HENT:  Head: Normocephalic and atraumatic.  Right Ear: External ear normal.  Left Ear: External ear normal.  Nose: Nose normal.  Mouth/Throat: Oropharynx is clear and moist.  Eyes: EOM are normal. Pupils are equal, round, and reactive to light.  Neck: Normal range of motion. Neck supple. No thyromegaly present.  Cardiovascular: Normal rate, regular rhythm and normal heart sounds.   No murmur heard. Pulmonary/Chest: Effort normal and breath sounds normal. No respiratory distress. He has no wheezes.  Abdominal: Soft. Bowel sounds are normal. He exhibits no  distension and no mass. There is no tenderness.  Genitourinary: Penis normal.  Musculoskeletal: Normal range of motion. He exhibits no edema.  Lymphadenopathy:    He has no cervical adenopathy.  Neurological: He is alert. He exhibits normal muscle tone.  Skin: Skin is warm and dry. No erythema.  Psychiatric: He has a normal mood and affect. His behavior is normal. Judgment normal.          Assessment & Plan:  Overall his physical exam is normal, he needs to lose weight we talked at length about proper diet regular exercise bring his weight under 220. 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

## 2013-07-25 ENCOUNTER — Telehealth: Payer: Self-pay | Admitting: Family Medicine

## 2013-07-25 NOTE — Telephone Encounter (Signed)
Please let the patient know that his lab work overall shows slight elevation in triglycerides HDL slightly low best approach is regular physical activity and healthy diet. No medications indicated. He should repeat this again in one years time. Prostate was normal.

## 2013-07-25 NOTE — Telephone Encounter (Signed)
Patient had recent labs done.

## 2013-07-26 NOTE — Telephone Encounter (Signed)
[facility] 07/26/13

## 2013-07-26 NOTE — Telephone Encounter (Signed)
Notified patient know that his lab work overall shows slight elevation in triglycerides HDL slightly low best approach is regular physical activity and healthy diet. No medications indicated. He should repeat this again in one years time. Prostate was normal. Patient verbalized understanding.

## 2014-12-11 ENCOUNTER — Encounter (INDEPENDENT_AMBULATORY_CARE_PROVIDER_SITE_OTHER): Payer: Self-pay | Admitting: Internal Medicine

## 2014-12-11 ENCOUNTER — Ambulatory Visit (INDEPENDENT_AMBULATORY_CARE_PROVIDER_SITE_OTHER): Payer: BC Managed Care – PPO | Admitting: Internal Medicine

## 2014-12-11 VITALS — BP 108/82 | HR 68 | Temp 97.7°F | Resp 18 | Ht 71.0 in | Wt 239.0 lb

## 2014-12-11 DIAGNOSIS — Z8719 Personal history of other diseases of the digestive system: Secondary | ICD-10-CM

## 2014-12-11 DIAGNOSIS — K602 Anal fissure, unspecified: Secondary | ICD-10-CM | POA: Diagnosis not present

## 2014-12-11 DIAGNOSIS — K5900 Constipation, unspecified: Secondary | ICD-10-CM

## 2014-12-11 MED ORDER — DILTIAZEM GEL 2 %: 1.0000 "application " | Freq: Every day | CUTANEOUS | Status: DC

## 2014-12-11 NOTE — Progress Notes (Signed)
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  Subjective:  Justin Estrada is a 49 year old Caucasian male who has history of anal fissure and constipation and is here for scheduled visit. He was last seen in September 2014. He states he is doing well. He has not used diltiazem gel in over 3 months. His prescription has expired and he would like to get a new prescription. He uses it on as-needed basis and usually for 3 days in a row. If he uses more than 3 days he develops itching. Only time he has discomfort as if he forgets to take Colace and stool comes heart. He denies sharp pain or rectal bleeding. He remains with good appetite. He is trying to walk more than he has and has lost 4 pounds. He states he takes diclofenac infrequently. He has not experienced any side effects with this medication. Last colonoscopy was more than 10 years ago.   Current Medications: Outpatient Encounter Prescriptions as of 12/11/2014  Medication Sig  . diclofenac (VOLTAREN) 75 MG EC tablet Take 1 tablet (75 mg total) by mouth 2 (two) times daily as needed.  . diltiazem 2 % GEL Apply 1 application topically at bedtime. Use as directed.  . docusate sodium (COLACE) 100 MG capsule Take 1 capsule (100 mg total) by mouth at bedtime.  . psyllium (METAMUCIL SMOOTH TEXTURE) 28 % packet Take 1 packet by mouth at bedtime.   No facility-administered encounter medications on file as of 12/11/2014.     Objective: Blood pressure 108/82, pulse 68, temperature 97.7 F (36.5 C), temperature source Oral, resp. rate 18, height 5\' 11"  (1.803 m), weight 239 lb (108.41 kg). Patient is alert and in no acute distress. Conjunctiva is pink. Sclera is nonicteric Oropharyngeal mucosa is normal. No neck masses or thyromegaly noted. Cardiac exam with regular rhythm normal S1 and S2. No murmur or gallop noted. Lungs are clear to auscultation. Abdomen is protuberant but soft and nontender without organomegaly or  masses.  Rectal examination is limited to external inspection and he has small skin tacked posteriorly which is very soft and nontender. No LE edema or clubbing noted.   Assessment:  #1. History of anal fissure. He is having sporadic discomfort but I do not believe he has chronic anal fissure. He will continue to use diltiazem gel on as-needed basis. #2. Constipation. He is doing well with dietary measures stool softer and fiber supplement.   Plan:  New prescription given for diltiazem gel 2% to be used on as-needed basis. Will retrieve prior colonoscopy records. Office visit on as-needed basis. Average risk screening colonoscopy after 50[age] birthday.

## 2014-12-11 NOTE — Patient Instructions (Signed)
Use diltiazem gel as directed. Screening colonoscopy after 50[age] birthday.

## 2015-02-06 ENCOUNTER — Encounter: Payer: Self-pay | Admitting: Family Medicine

## 2015-02-06 ENCOUNTER — Ambulatory Visit (INDEPENDENT_AMBULATORY_CARE_PROVIDER_SITE_OTHER): Payer: BC Managed Care – PPO | Admitting: Family Medicine

## 2015-02-06 VITALS — BP 114/80 | Temp 97.7°F | Ht 71.0 in | Wt 241.0 lb

## 2015-02-06 DIAGNOSIS — J069 Acute upper respiratory infection, unspecified: Secondary | ICD-10-CM | POA: Diagnosis not present

## 2015-02-06 DIAGNOSIS — J019 Acute sinusitis, unspecified: Secondary | ICD-10-CM

## 2015-02-06 DIAGNOSIS — B9689 Other specified bacterial agents as the cause of diseases classified elsewhere: Secondary | ICD-10-CM

## 2015-02-06 MED ORDER — HYDROCODONE-HOMATROPINE 5-1.5 MG/5ML PO SYRP: 5.0000 mL | ORAL_SOLUTION | Freq: Four times a day (QID) | ORAL | Status: DC | PRN

## 2015-02-06 MED ORDER — AMOXICILLIN 500 MG PO TABS
500.0000 mg | ORAL_TABLET | Freq: Three times a day (TID) | ORAL | Status: DC
Start: 2015-02-06 — End: 2015-03-07

## 2015-02-06 NOTE — Progress Notes (Signed)
   Subjective:    Patient ID: Justin Estrada, male    DOB: 08/04/1965, 49 y.o.   MRN: [id number]  Sinusitis This is a new problem. The current episode started in the past 7 days. The problem has been gradually worsening since onset. There has been no fever. The pain is moderate. Associated symptoms include congestion and coughing. Pertinent negatives include no ear pain. Past treatments include oral decongestants. The treatment provided mild relief.   Patient states that he has no other concerns at this time.  PMH benign  Review of Systems  Constitutional: Negative for fever and activity change.  HENT: Positive for congestion and rhinorrhea. Negative for ear pain.   Eyes: Negative for discharge.  Respiratory: Positive for cough. Negative for wheezing.   Cardiovascular: Negative for chest pain.       Objective:   Physical Exam  Constitutional: He appears well-developed.  HENT:  Head: Normocephalic.  Mouth/Throat: Oropharynx is clear and moist. No oropharyngeal exudate.  Neck: Normal range of motion.  Cardiovascular: Normal rate, regular rhythm and normal heart sounds.   No murmur heard. Pulmonary/Chest: Effort normal and breath sounds normal. He has no wheezes.  Lymphadenopathy:    He has no cervical adenopathy.  Neurological: He exhibits normal muscle tone.  Skin: Skin is warm and dry.  Nursing note and vitals reviewed.         Assessment & Plan:   viral syndrome Secondary rhinosinusitis Patient was seen today for upper respiratory illness. It is felt that the patient is dealing with sinusitis. Antibiotics were prescribed today. Importance of compliance with medication was discussed. Symptoms should gradually resolve over the course of the next several days. If high fevers, progressive illness, difficulty breathing, worsening condition or failure for symptoms to improve over the next several days then the patient is to follow-up. If any emergent conditions the patient is to  follow-up in the emergency department otherwise to follow-up in the office.

## 2015-02-20 ENCOUNTER — Ambulatory Visit (INDEPENDENT_AMBULATORY_CARE_PROVIDER_SITE_OTHER): Payer: BC Managed Care – PPO | Admitting: Family Medicine

## 2015-02-20 ENCOUNTER — Encounter: Payer: Self-pay | Admitting: Family Medicine

## 2015-02-20 VITALS — BP 138/82 | Temp 98.4°F | Ht 71.0 in | Wt 231.8 lb

## 2015-02-20 DIAGNOSIS — B9689 Other specified bacterial agents as the cause of diseases classified elsewhere: Secondary | ICD-10-CM

## 2015-02-20 DIAGNOSIS — J019 Acute sinusitis, unspecified: Secondary | ICD-10-CM | POA: Diagnosis not present

## 2015-02-20 MED ORDER — BENZONATATE 200 MG PO CAPS: 200.0000 mg | ORAL_CAPSULE | Freq: Three times a day (TID) | ORAL | Status: DC | PRN

## 2015-02-20 MED ORDER — AZITHROMYCIN 250 MG PO TABS: ORAL_TABLET | ORAL | Status: DC

## 2015-02-20 NOTE — Progress Notes (Signed)
   Subjective:    Patient ID: Justin Estrada, male    DOB: 03/21/1965, 49 y.o.   MRN: [id number]  Cough This is a new problem. The current episode started 1 to 4 weeks ago. Associated symptoms include nasal congestion, rhinorrhea and a sore throat. Pertinent negatives include no chest pain, ear pain, fever or wheezing. Treatments tried: sudafed , amoxil.     PMH recent sinus infection was on antibiotics got better but then this relapsed over the past several days with sinus pressure and pain  Review of Systems  Constitutional: Negative for fever and activity change.  HENT: Positive for congestion, rhinorrhea and sore throat. Negative for ear pain.   Eyes: Negative for discharge.  Respiratory: Positive for cough. Negative for wheezing.   Cardiovascular: Negative for chest pain.       Objective:   Physical Exam  Constitutional: He appears well-developed.  HENT:  Head: Normocephalic.  Mouth/Throat: Oropharynx is clear and moist. No oropharyngeal exudate.  Neck: Normal range of motion.  Cardiovascular: Normal rate, regular rhythm and normal heart sounds.   No murmur heard. Pulmonary/Chest: Effort normal and breath sounds normal. He has no wheezes.  Lymphadenopathy:    He has no cervical adenopathy.  Neurological: He exhibits normal muscle tone.  Skin: Skin is warm and dry.  Nursing note and vitals reviewed.         Assessment & Plan:   viral syndrome Secondary rhinosinusitis Patient was seen today for upper respiratory illness. It is felt that the patient is dealing with sinusitis. Antibiotics were prescribed today. Importance of compliance with medication was discussed. Symptoms should gradually resolve over the course of the next several days. If high fevers, progressive illness, difficulty breathing, worsening condition or failure for symptoms to improve over the next several days then the patient is to follow-up. If any emergent conditions the patient is to follow-up in the  emergency department otherwise to follow-up in the office.

## 2015-03-07 ENCOUNTER — Ambulatory Visit (INDEPENDENT_AMBULATORY_CARE_PROVIDER_SITE_OTHER): Payer: BC Managed Care – PPO | Admitting: Family Medicine

## 2015-03-07 ENCOUNTER — Encounter: Payer: Self-pay | Admitting: Family Medicine

## 2015-03-07 VITALS — BP 118/70 | Temp 98.3°F | Ht 71.0 in | Wt 237.5 lb

## 2015-03-07 DIAGNOSIS — J019 Acute sinusitis, unspecified: Secondary | ICD-10-CM

## 2015-03-07 DIAGNOSIS — B9689 Other specified bacterial agents as the cause of diseases classified elsewhere: Secondary | ICD-10-CM

## 2015-03-07 MED ORDER — LEVOFLOXACIN 500 MG PO TABS: 500.0000 mg | ORAL_TABLET | Freq: Every day | ORAL | Status: DC

## 2015-03-07 NOTE — Progress Notes (Signed)
   Subjective:    Patient ID: Justin LynchJames T Montecalvo, male    DOB: 11/08/1965, 50 y.o.   MRN: [id number]  Cough This is a new problem. The current episode started 1 to 4 weeks ago. The problem has been unchanged. The cough is productive of sputum. Associated symptoms include rhinorrhea. Pertinent negatives include no chest pain, ear pain, fever or wheezing. Associated symptoms comments: congestion. Nothing aggravates the symptoms. Treatments tried: antibiotics. The treatment provided no relief.    Patient relates head congestion drainage coughing sinus pressure denies high fever chills  Review of Systems  Constitutional: Negative for fever and activity change.  HENT: Positive for congestion and rhinorrhea. Negative for ear pain.   Eyes: Negative for discharge.  Respiratory: Positive for cough. Negative for wheezing.   Cardiovascular: Negative for chest pain.       Objective:   Physical Exam  Constitutional: He appears well-developed.  HENT:  Head: Normocephalic.  Mouth/Throat: Oropharynx is clear and moist. No oropharyngeal exudate.  Neck: Normal range of motion.  Cardiovascular: Normal rate, regular rhythm and normal heart sounds.   No murmur heard. Pulmonary/Chest: Effort normal and breath sounds normal. He has no wheezes.  Lymphadenopathy:    He has no cervical adenopathy.  Neurological: He exhibits normal muscle tone.  Skin: Skin is warm and dry.  Nursing note and vitals reviewed.   Prolonged illness over the past several weeks sinus involvement and some bronchiolar involvement one additional round of antibiotics should be helpful      Assessment & Plan:  Patient was seen today for upper respiratory illness. It is felt that the patient is dealing with sinusitis. Antibiotics were prescribed today. Importance of compliance with medication was discussed. Symptoms should gradually resolve over the course of the next several days. If high fevers, progressive illness, difficulty  breathing, worsening condition or failure for symptoms to improve over the next several days then the patient is to follow-up. If any emergent conditions the patient is to follow-up in the emergency department otherwise to follow-up in the office.

## 2015-03-15 ENCOUNTER — Telehealth: Payer: Self-pay | Admitting: Family Medicine

## 2015-03-15 NOTE — Telephone Encounter (Signed)
Pt states on VM left here that you told him to call here anytime he had a  Question, he did not elaborate as to what the question was but asked for  You to give him a call.  Call was placed here at 3:43 yesterday evening.  Call his cell or home I am assuming

## 2015-03-20 NOTE — Telephone Encounter (Signed)
I did speak with the patient he was having some shortness and pains in the groin region after doing stretching got better with anti-inflammatory he will follow-up if ongoing troubles

## 2015-03-23 ENCOUNTER — Telehealth: Payer: Self-pay | Admitting: Family Medicine

## 2015-03-23 DIAGNOSIS — R5383 Other fatigue: Secondary | ICD-10-CM

## 2015-03-23 DIAGNOSIS — Z13220 Encounter for screening for lipoid disorders: Secondary | ICD-10-CM

## 2015-03-23 DIAGNOSIS — Z79899 Other long term (current) drug therapy: Secondary | ICD-10-CM

## 2015-03-23 MED ORDER — DICLOFENAC SODIUM 75 MG PO TBEC: 75.0000 mg | DELAYED_RELEASE_TABLET | Freq: Two times a day (BID) | ORAL | Status: DC | PRN

## 2015-03-23 NOTE — Telephone Encounter (Signed)
The patient may have 60 tablets with one refill, please let the patient know that we would like for him to do a wellness check this spring

## 2015-03-23 NOTE — Telephone Encounter (Signed)
Pt is needing refills on his diclofenac his has expired.     [facility]

## 2015-03-23 NOTE — Telephone Encounter (Signed)
No med check in over 2 years. Last physical 04/20/13

## 2015-03-23 NOTE — Telephone Encounter (Signed)
[facility] 03/23/15 

## 2015-03-23 NOTE — Telephone Encounter (Signed)
Lipid, liver, metabolic 7, CBC 

## 2015-03-23 NOTE — Telephone Encounter (Signed)
Pt notified script sent and transferred to front to schedule an ov.

## 2015-03-23 NOTE — Telephone Encounter (Signed)
Pt is requesting lab orders to be sent over for an appt in March. There are no previous labs in Epic.

## 2015-03-23 NOTE — Telephone Encounter (Signed)
Orders ready.pt notified on vm.

## 2015-04-20 ENCOUNTER — Encounter: Payer: Self-pay | Admitting: Family Medicine

## 2015-04-20 ENCOUNTER — Ambulatory Visit (INDEPENDENT_AMBULATORY_CARE_PROVIDER_SITE_OTHER): Payer: BC Managed Care – PPO | Admitting: Family Medicine

## 2015-04-20 VITALS — BP 110/78 | Ht 71.0 in | Wt 230.0 lb

## 2015-04-20 DIAGNOSIS — M25551 Pain in right hip: Secondary | ICD-10-CM

## 2015-04-20 NOTE — Progress Notes (Signed)
   Subjective:    Patient ID: Justin Estrada, male    DOB: 1965/05/15, 50 y.o.   MRN: [id number]  Hip Pain  Incident onset: 2 weeks ago. Pain location: right hip, groin area. Treatments tried: diclofenac. The treatment provided moderate relief.   This is been going on over the past few weeks he describes aching in the discomfort he was hesitant to take the anti-inflammatory because he thought it was getting interfere with his stomach he denies any rectal bleeding or vomiting. Denies any sciatica symptoms.   Review of Systems See above.    Objective:   Physical Exam  Lungs clear abdomen soft flanks nontender good internal/external rotation of the right hip with some tenderness near the trochanter region on the right side low back nontender negative straight leg raise      Assessment & Plan:   hip discomfort I believe that this is more of a ligamentous or possibly a bursitis issue it is fine for him to take the anti-inflammatory twice a day over the course of the next 3-4 weeks I recommend that if he is not doing better by the time he follows up we'll refer him for physical therapy possibly orthopedics if he is having worsening trouble or ongoing trouble x-rays of the hip would be indicated  Lab work in wellness visit in a few weeks

## 2015-04-20 NOTE — Patient Instructions (Signed)
Use Voltaren 2 times a day for the next few weeks  Do your labs

## 2015-04-25 ENCOUNTER — Telehealth: Payer: Self-pay | Admitting: Family Medicine

## 2015-04-25 ENCOUNTER — Encounter: Payer: Self-pay | Admitting: Family Medicine

## 2015-04-25 ENCOUNTER — Ambulatory Visit (INDEPENDENT_AMBULATORY_CARE_PROVIDER_SITE_OTHER): Payer: BC Managed Care – PPO | Admitting: Family Medicine

## 2015-04-25 VITALS — BP 132/88 | Temp 98.4°F | Wt 233.0 lb

## 2015-04-25 DIAGNOSIS — K6289 Other specified diseases of anus and rectum: Secondary | ICD-10-CM

## 2015-04-25 MED ORDER — CIPROFLOXACIN HCL 500 MG PO TABS: 500.0000 mg | ORAL_TABLET | Freq: Two times a day (BID) | ORAL | Status: DC

## 2015-04-25 NOTE — Progress Notes (Signed)
   Subjective:    Patient ID: Justin Estrada, male    DOB: 04/22/1965, 50 y.o.   MRN: [id number]  HPIPainful spot on buttock. Hard to sit. noticied it 3 days ago. Patient denies high fever chills nausea vomiting diarrhea. Relates history of hemorrhoids also relates history skin tags in addition to this having some tenderness around the rectum over the past few days difficult to sit on denies vomiting diarrhea or bloody stools no respiratory symptoms   Review of Systems     Objective:   Physical Exam  Patient has erythematous area near the rectum and does not appear to be fluctuant he also has what appears to be a developing hemorrhoid in the region. Also with skin tags although that does not seem to be causing any issue. Patient relates tenderness to palpation  Patient is do warm compresses 20 minutes at a time at least 4 times per day ibuprofen for pain if worse let us know    Assessment & Plan:  What appears to be a possibility of cellulitis versus a developing abscess near the rectum-Cipro 500 twice a day over the course of next 2 weeks if not seen significant improvement over the next 3-4 days along with warm compresses patient needs to let us know we will help set him up with general surgery.

## 2015-04-25 NOTE — Telephone Encounter (Signed)
error 

## 2015-05-16 LAB — CBC WITH DIFFERENTIAL/PLATELET
BASOS ABS: 0.1 10*3/uL (ref 0.0–0.2)
Basos: 1 %
EOS (ABSOLUTE): 0.1 10*3/uL (ref 0.0–0.4)
Eos: 1 %
Hematocrit: 47.8 % (ref 37.5–51.0)
Hemoglobin: 16.1 g/dL (ref 12.6–17.7)
Immature Grans (Abs): 0 10*3/uL (ref 0.0–0.1)
Immature Granulocytes: 0 %
LYMPHS ABS: 1.6 10*3/uL (ref 0.7–3.1)
Lymphs: 28 %
MCH: 30.2 pg (ref 26.6–33.0)
MCHC: 33.7 g/dL (ref 31.5–35.7)
MCV: 90 fL (ref 79–97)
MONOS ABS: 0.6 10*3/uL (ref 0.1–0.9)
Monocytes: 10 %
NEUTROS ABS: 3.4 10*3/uL (ref 1.4–7.0)
Neutrophils: 60 %
PLATELETS: 297 10*3/uL (ref 150–379)
RBC: 5.33 x10E6/uL (ref 4.14–5.80)
RDW: 13.2 % (ref 12.3–15.4)
WBC: 5.8 10*3/uL (ref 3.4–10.8)

## 2015-05-16 LAB — BASIC METABOLIC PANEL
BUN / CREAT RATIO: 14 (ref 9–20)
BUN: 15 mg/dL (ref 6–24)
CALCIUM: 9.6 mg/dL (ref 8.7–10.2)
CHLORIDE: 107 mmol/L — AB (ref 96–106)
CO2: 23 mmol/L (ref 18–29)
Creatinine, Ser: 1.04 mg/dL (ref 0.76–1.27)
GFR calc non Af Amer: 85 mL/min/{1.73_m2} (ref >59)
GFR, EST AFRICAN AMERICAN: 98 mL/min/{1.73_m2} (ref >59)
Glucose: 95 mg/dL (ref 65–99)
POTASSIUM: 5.2 mmol/L (ref 3.5–5.2)
Sodium: 147 mmol/L — ABNORMAL HIGH (ref 134–144)

## 2015-05-16 LAB — HEPATIC FUNCTION PANEL
ALT: 19 IU/L (ref 0–44)
AST: 15 IU/L (ref 0–40)
Albumin: 4.6 g/dL (ref 3.5–5.5)
Alkaline Phosphatase: 70 IU/L (ref 39–117)
BILIRUBIN TOTAL: 0.8 mg/dL (ref 0.0–1.2)
Bilirubin, Direct: 0.22 mg/dL (ref 0.00–0.40)
Total Protein: 6.8 g/dL (ref 6.0–8.5)

## 2015-05-16 LAB — LIPID PANEL
CHOLESTEROL TOTAL: 151 mg/dL (ref 100–199)
Chol/HDL Ratio: 4.4 ratio units (ref 0.0–5.0)
HDL: 34 mg/dL — ABNORMAL LOW (ref >39)
LDL CALC: 93 mg/dL (ref 0–99)
TRIGLYCERIDES: 121 mg/dL (ref 0–149)
VLDL Cholesterol Cal: 24 mg/dL (ref 5–40)

## 2015-05-18 ENCOUNTER — Ambulatory Visit (INDEPENDENT_AMBULATORY_CARE_PROVIDER_SITE_OTHER): Payer: BC Managed Care – PPO | Admitting: Family Medicine

## 2015-05-18 ENCOUNTER — Encounter: Payer: Self-pay | Admitting: Family Medicine

## 2015-05-18 VITALS — BP 122/80 | Ht 70.5 in | Wt 233.5 lb

## 2015-05-18 DIAGNOSIS — M25551 Pain in right hip: Secondary | ICD-10-CM

## 2015-05-18 DIAGNOSIS — Z0000 Encounter for general adult medical examination without abnormal findings: Secondary | ICD-10-CM

## 2015-05-18 NOTE — Progress Notes (Signed)
   Subjective:    Patient ID: Justin Estrada, male    DOB: 01/13/1966, 50 y.o.   MRN: [id number]  HPI The patient comes in today for a wellness visit.    A review of their health history was completed.  A review of medications was also completed.  Any needed refills: N/A  Eating habits: good  Falls/  MVA accidents in past few months: none  Regular exercise: yes  Specialist pt sees on regular basis: none  Preventative health issues were discussed.   Additional concerns: none   Review of Systems  Constitutional: Negative for activity change, appetite change and fatigue.  HENT: Negative for congestion.   Respiratory: Negative for cough.   Cardiovascular: Negative for chest pain.  Gastrointestinal: Negative for abdominal pain.  Endocrine: Negative for polydipsia and polyphagia.  Neurological: Negative for weakness.  Psychiatric/Behavioral: Negative for confusion.   Tenderness in the anterior right hip right groin region    Objective:   Physical Exam  Constitutional: He appears well-developed and well-nourished.  HENT:  Head: Normocephalic and atraumatic.  Right Ear: External ear normal.  Left Ear: External ear normal.  Nose: Nose normal.  Mouth/Throat: Oropharynx is clear and moist.  Eyes: EOM are normal. Pupils are equal, round, and reactive to light.  Neck: Normal range of motion. Neck supple. No thyromegaly present.  Cardiovascular: Normal rate, regular rhythm and normal heart sounds.   No murmur heard. Pulmonary/Chest: Effort normal and breath sounds normal. No respiratory distress. He has no wheezes.  Abdominal: Soft. Bowel sounds are normal. He exhibits no distension and no mass. There is no tenderness.  Genitourinary: Penis normal.  Musculoskeletal: Normal range of motion. He exhibits no edema.  Lymphadenopathy:    He has no cervical adenopathy.  Neurological: He is alert. He exhibits normal muscle tone.  Skin: Skin is warm and dry. No erythema.    Psychiatric: He has a normal mood and affect. His behavior is normal. Judgment normal.          Assessment & Plan:  Right hip right groin tenderness and pain patient was seen for this just recently we will refer to orthopedics for further evaluation  Safety dietary measures discussed importance of losing weight discuss lab work overall looks good this was reviewed with patient  Follow-up wellness in one year

## 2015-05-22 ENCOUNTER — Ambulatory Visit (INDEPENDENT_AMBULATORY_CARE_PROVIDER_SITE_OTHER): Payer: BC Managed Care – PPO | Admitting: Internal Medicine

## 2015-05-23 ENCOUNTER — Encounter: Payer: Self-pay | Admitting: Family Medicine

## 2015-09-06 ENCOUNTER — Other Ambulatory Visit: Payer: Self-pay | Admitting: Sports Medicine

## 2015-09-06 DIAGNOSIS — M25551 Pain in right hip: Secondary | ICD-10-CM

## 2015-09-18 ENCOUNTER — Ambulatory Visit
Admission: RE | Admit: 2015-09-18 | Discharge: 2015-09-18 | Disposition: A | Discharge status: Home / Self Care | Payer: BC Managed Care – PPO | Source: Ambulatory Visit | Attending: Sports Medicine | Admitting: Sports Medicine

## 2015-09-18 DIAGNOSIS — M25551 Pain in right hip: Secondary | ICD-10-CM

## 2015-09-18 MED ORDER — IOPAMIDOL (ISOVUE-M 200) INJECTION 41%
15.0000 mL | Freq: Once | INTRAMUSCULAR | Status: AC
  Administered 2015-09-18: 15 mL via INTRA_ARTICULAR

## 2015-12-07 ENCOUNTER — Other Ambulatory Visit: Payer: Self-pay | Admitting: Family Medicine

## 2015-12-21 ENCOUNTER — Ambulatory Visit (INDEPENDENT_AMBULATORY_CARE_PROVIDER_SITE_OTHER): Payer: BC Managed Care – PPO | Admitting: Family Medicine

## 2015-12-21 ENCOUNTER — Encounter: Payer: Self-pay | Admitting: Family Medicine

## 2015-12-21 VITALS — BP 124/86 | Temp 98.1°F | Ht 70.5 in | Wt 237.2 lb

## 2015-12-21 DIAGNOSIS — J392 Other diseases of pharynx: Secondary | ICD-10-CM | POA: Diagnosis not present

## 2015-12-21 NOTE — Progress Notes (Signed)
   Subjective:    Patient ID: Justin Estrada, male    DOB: 01/22/1966, 50 y.o.   MRN: [id number]  Sore Throat   This is a new problem. The current episode started in the past 7 days. The pain is worse on the right side. He has tried nothing for the symptoms.  Patient relates the pain is on the right side down low near the level of the thyroid and focal cords he denies choking on anything he denies any tightness pain discomfort. Denies sweats or chills  States no other concerns this visit.   Review of Systems No wheezing fever coughing denies swelling in the pharyngeal area denies difficulty swallowing    Objective:   Physical Exam Lungs are clear hearts regular throat non-erythematous neck no masses no tenderness subjected discomfort right midneck near the laryngeal pharyngeal region       Assessment & Plan:  Pharyngeal pain on the right side no sign of any mass or growth I recommend supportive measures anti-inflammatories if not doing significantly better within the next week notify us and we will set him up with ENT

## 2016-01-03 ENCOUNTER — Ambulatory Visit (INDEPENDENT_AMBULATORY_CARE_PROVIDER_SITE_OTHER): Payer: BC Managed Care – PPO | Admitting: Nurse Practitioner

## 2016-01-03 VITALS — BP 108/70 | HR 68 | Temp 98.1°F | Ht 70.5 in | Wt 241.2 lb

## 2016-01-03 DIAGNOSIS — J029 Acute pharyngitis, unspecified: Secondary | ICD-10-CM

## 2016-01-03 LAB — POCT RAPID STREP A (OFFICE): RAPID STREP A SCREEN: NEGATIVE

## 2016-01-04 ENCOUNTER — Encounter: Payer: Self-pay | Admitting: Nurse Practitioner

## 2016-01-04 ENCOUNTER — Telehealth: Payer: Self-pay | Admitting: Family Medicine

## 2016-01-04 ENCOUNTER — Other Ambulatory Visit: Payer: Self-pay | Admitting: Nurse Practitioner

## 2016-01-04 LAB — STREP A DNA PROBE: Strep Gp A Direct, DNA Probe: NEGATIVE

## 2016-01-04 MED ORDER — AZITHROMYCIN 250 MG PO TABS: ORAL_TABLET | ORAL | 0 refills | Status: DC

## 2016-01-04 NOTE — Telephone Encounter (Signed)
Patient is having head congestion, runny nose, colored congestion and coughing. No other symptoms.

## 2016-01-04 NOTE — Telephone Encounter (Signed)
Patient seen on 01/03/16 for sore throat by Justin Jonesarolyn.  He is now having head congestion, runny nose, discolored phlegm, and a little cough and wants to know if we can send in Rx for him.   Justin Estrada

## 2016-01-04 NOTE — Telephone Encounter (Signed)
Antibiotic sent in and message sent through my chart.

## 2016-01-04 NOTE — Progress Notes (Signed)
Subjective:  Presents for complaints of sore throat that began yesterday. No fever headache runny nose cough or ear pain. No vomiting diarrhea or abdominal pain. Taking fluids well. Voiding normal limit. Note patient was seen on 12/21/2015 for pharyngeal pain, this had completely resolved. States this is a separate issue.  Objective:   BP 108/70 (BP Location: Left Arm, Patient Position: Sitting, Cuff Size: Normal)   Pulse 68   Temp 98.1 F (36.7 C) (Oral)   Ht 5' 10.5" (1.791 m)   Wt 241 lb 3.2 oz (109.4 kg)   SpO2 98%   BMI 34.12 kg/m  NAD. Alert, oriented. TMs clear effusion, no erythema. Pharynx minimally erythematous, RST negative. Neck supple with mild soft anterior adenopathy. Lungs clear. Heart regular rate rhythm.  Assessment: Acute pharyngitis, unspecified etiology - Plan: POCT rapid strep A, Strep A DNA probe  Plan: Throat culture pending. Reviewed symptomatic care and warning signs. Call back in 4 days if no improvement, sooner if worse.

## 2016-03-04 ENCOUNTER — Ambulatory Visit (INDEPENDENT_AMBULATORY_CARE_PROVIDER_SITE_OTHER): Payer: BC Managed Care – PPO | Admitting: Internal Medicine

## 2016-03-19 ENCOUNTER — Encounter: Payer: Self-pay | Admitting: Family Medicine

## 2016-03-19 ENCOUNTER — Ambulatory Visit (INDEPENDENT_AMBULATORY_CARE_PROVIDER_SITE_OTHER): Payer: BC Managed Care – PPO | Admitting: Family Medicine

## 2016-03-19 VITALS — BP 110/80 | Temp 97.8°F | Ht 70.5 in | Wt 244.2 lb

## 2016-03-19 DIAGNOSIS — J019 Acute sinusitis, unspecified: Secondary | ICD-10-CM | POA: Diagnosis not present

## 2016-03-19 DIAGNOSIS — J029 Acute pharyngitis, unspecified: Secondary | ICD-10-CM

## 2016-03-19 DIAGNOSIS — B9689 Other specified bacterial agents as the cause of diseases classified elsewhere: Secondary | ICD-10-CM

## 2016-03-19 LAB — POCT RAPID STREP A (OFFICE): Rapid Strep A Screen: NEGATIVE

## 2016-03-19 MED ORDER — HYDROCODONE-HOMATROPINE 5-1.5 MG/5ML PO SYRP: 5.0000 mL | ORAL_SOLUTION | Freq: Four times a day (QID) | ORAL | 0 refills | Status: DC | PRN

## 2016-03-19 MED ORDER — AZITHROMYCIN 250 MG PO TABS: ORAL_TABLET | ORAL | 0 refills | Status: DC

## 2016-03-19 NOTE — Progress Notes (Signed)
   Subjective:    Patient ID: Justin Estrada, male    DOB: 01/27/1966, 51 y.o.   MRN: [id number]  Sore Throat   This is a new problem. The current episode started in the past 7 days. The problem has been unchanged. Neither side of throat is experiencing more pain than the other. There has been no fever. The pain is moderate. Associated symptoms comments: Runny nose. He has tried nothing for the symptoms. The treatment provided no relief.  Patient has not felt well over the past for 5 days head congestion sore throat not feeling good no wheezing no difficulty breathing a lot of throat congestion and coughing    Review of Systems    please see above no vomiting or diarrhea no high fever no body aches Objective:   Physical Exam Eardrums normal throat is normal neck no masses lungs clear heart regular rapid strep negative       Assessment & Plan:  Pharyngitis Acute rhinosinusitis Antibiotic prescribed warning signs discussed follow-up if problems

## 2016-03-20 LAB — STREP A DNA PROBE: STREP GP A DIRECT, DNA PROBE: NEGATIVE

## 2016-03-20 LAB — PLEASE NOTE

## 2016-04-24 ENCOUNTER — Ambulatory Visit (INDEPENDENT_AMBULATORY_CARE_PROVIDER_SITE_OTHER): Payer: BC Managed Care – PPO | Admitting: Internal Medicine

## 2016-04-24 ENCOUNTER — Encounter (INDEPENDENT_AMBULATORY_CARE_PROVIDER_SITE_OTHER): Payer: Self-pay | Admitting: Internal Medicine

## 2016-04-24 VITALS — BP 120/84 | HR 68 | Temp 97.4°F | Resp 18 | Ht 71.0 in | Wt 245.6 lb

## 2016-04-24 DIAGNOSIS — Z8719 Personal history of other diseases of the digestive system: Secondary | ICD-10-CM

## 2016-04-24 DIAGNOSIS — K5900 Constipation, unspecified: Secondary | ICD-10-CM

## 2016-04-24 NOTE — Progress Notes (Signed)
Presenting complaint;  Follow-up regarding anal fissure and constipation.  Subjective:  Daughters 51 year old Caucasian male who has history of anal fissure constipation began to experience pain on defecation and he also noted scant amount of blood. He began to use diltiazem gel. His discomfort has resolved. He states his stools become hard if he forgets to take Metamucil and stool suffered. He is trying his best not to miss these medications. He wonders if he should have banding for anal tag. It is not bothering him now. Last colonoscopy was in 2002. He has gained 6 pounds since his last visit 16 months ago. He will be turning 51 next month and would like to schedule colonoscopy during summer break. His family history is negative for CRC.   Current Medications: Outpatient Encounter Prescriptions as of 04/24/2016  Medication Sig  . diclofenac (VOLTAREN) 75 MG EC tablet Take 1 tablet by mouth at bedtime and may repeat dose one time if needed.  . diltiazem 2 % GEL Apply 1 application topically at bedtime. Use as directed.  . docusate sodium (COLACE) 100 MG capsule Take 1 capsule (100 mg total) by mouth at bedtime.  . psyllium (METAMUCIL SMOOTH TEXTURE) 28 % packet Take 1 packet by mouth at bedtime.  . [DISCONTINUED] azithromycin (ZITHROMAX Z-PAK) 250 MG tablet Take 2 tablets (500 mg) on  Day 1,  followed by 1 tablet (250 mg) once daily on Days 2 through 5. (Patient not taking: Reported on 04/24/2016)  . [DISCONTINUED] HYDROcodone-homatropine (HYCODAN) 5-1.5 MG/5ML syrup Take 5 mLs by mouth every 6 (six) hours as needed for cough. (Patient not taking: Reported on 04/24/2016)   No facility-administered encounter medications on file as of 04/24/2016.      Objective: Blood pressure 120/84, pulse 68, temperature 97.4 F (36.3 C), temperature source Oral, resp. rate 18, height 5\' 11"  (1.803 m), weight 245 lb 9.6 oz (111.4 kg). Patient is alert and in no acute distress. Conjunctiva is pink. Sclera is  nonicteric Oropharyngeal mucosa is normal. No neck masses or thyromegaly noted. Cardiac exam with regular rhythm normal S1 and S2. No murmur or gallop noted. Lungs are clear to auscultation. Abdomen is full. On palpation is soft and nontender without organomegaly or masses.  Rectal examination limited to external exam. He has soft flat skin tag was tear to anal orifice. No tenderness noted on palpation. No LE edema or clubbing noted.   Assessment:  #1. History of anal fissure. He was treated over a year ago and he may have suffered another episode last month that he is doing much better. He needs to do his best to avoid constipation. He has single anal skin tag. No treatment indicated unless he has discomfort or he is not able to keep himself clean. #2. Chronic constipation. He does well as long as he takes stool softener and Metamucil. #3. Patient is average risk for CRC. Will schedule screening colonoscopy during summer break i.e. June 2018.   Plan:  Patient will continue Colace and Metamucil as before. He should she used moist wipes. Average risk screening colonoscopy in June 2018. Patient encouraged to increase physical activity and should try to do regular walking exercise. His goal should be 3-4 times a week and minimum of 30 minutes. Office visit on as-needed basis.

## 2016-04-24 NOTE — Patient Instructions (Signed)
Colonoscopy to be scheduled in June, 2018.

## 2016-04-30 ENCOUNTER — Other Ambulatory Visit (INDEPENDENT_AMBULATORY_CARE_PROVIDER_SITE_OTHER): Payer: Self-pay | Admitting: *Deleted

## 2016-04-30 DIAGNOSIS — Z1211 Encounter for screening for malignant neoplasm of colon: Secondary | ICD-10-CM

## 2016-06-03 ENCOUNTER — Ambulatory Visit (INDEPENDENT_AMBULATORY_CARE_PROVIDER_SITE_OTHER): Payer: BC Managed Care – PPO | Admitting: Internal Medicine

## 2016-06-10 ENCOUNTER — Encounter (INDEPENDENT_AMBULATORY_CARE_PROVIDER_SITE_OTHER): Payer: Self-pay

## 2016-06-26 ENCOUNTER — Encounter (INDEPENDENT_AMBULATORY_CARE_PROVIDER_SITE_OTHER): Payer: Self-pay | Admitting: *Deleted

## 2016-06-26 ENCOUNTER — Telehealth (INDEPENDENT_AMBULATORY_CARE_PROVIDER_SITE_OTHER): Payer: Self-pay | Admitting: *Deleted

## 2016-06-26 MED ORDER — PEG 3350-KCL-NA BICARB-NACL 420 G PO SOLR: 4000.0000 mL | Freq: Once | ORAL | 0 refills | Status: AC

## 2016-06-26 NOTE — Telephone Encounter (Signed)
Patient needs trilyte 

## 2016-06-30 ENCOUNTER — Ambulatory Visit (INDEPENDENT_AMBULATORY_CARE_PROVIDER_SITE_OTHER): Payer: BC Managed Care – PPO | Admitting: Family Medicine

## 2016-06-30 ENCOUNTER — Encounter: Payer: Self-pay | Admitting: Family Medicine

## 2016-06-30 VITALS — BP 132/84 | Temp 98.0°F | Ht 70.5 in | Wt 244.1 lb

## 2016-06-30 DIAGNOSIS — M79675 Pain in left toe(s): Secondary | ICD-10-CM | POA: Diagnosis not present

## 2016-06-30 NOTE — Progress Notes (Signed)
   Subjective:    Patient ID: Justin LynchJames T Cairns, male    DOB: 05/31/1965, 51 y.o.   MRN: [id number]  Foot Pain  This is a new problem. The current episode started 1 to 4 weeks ago. The symptoms are aggravated by walking and standing.    Patient in today with left pinky toe pain.  Patient states he slipped down some steps felt pain in the small toe in a day later felt a little bit of pain in his calf States no other concerns this visit.  Review of Systems States he had a rough several days but is starting to get better    Objective:   Physical Exam Calf is nontender no swelling Tenderness at the base of the small toe midfoot nontender ankle normal  The toe does not have any deformity noted to it there is some bruising that seems to be healing at the base of the other fourth and third toe    Assessment & Plan:  Mild contusion with bruising possible fracture will take up to 6 weeks to heal I don't feel that x-rays absolutely necessary we discussed this at length with the patient he has decided not to do an x-ray. He will call back if ongoing troubles follow-up if worse

## 2016-07-09 ENCOUNTER — Ambulatory Visit: Payer: BC Managed Care – PPO | Admitting: Podiatry

## 2016-07-23 ENCOUNTER — Encounter: Payer: Self-pay | Admitting: Family Medicine

## 2016-07-23 ENCOUNTER — Ambulatory Visit (INDEPENDENT_AMBULATORY_CARE_PROVIDER_SITE_OTHER): Payer: BC Managed Care – PPO | Admitting: Family Medicine

## 2016-07-23 VITALS — BP 128/90 | Temp 98.3°F | Ht 70.5 in | Wt 243.0 lb

## 2016-07-23 DIAGNOSIS — R1011 Right upper quadrant pain: Secondary | ICD-10-CM | POA: Diagnosis not present

## 2016-07-23 NOTE — Progress Notes (Signed)
   Subjective:    Patient ID: Edmonia LynchJames T Westervelt, male    DOB: 06/14/1965, 51 y.o.   MRN: [id number]  Abdominal Pain  This is a new problem. Episode onset: 5 days. Associated symptoms comments: Back pain, diarrhea. He has tried nothing for the symptoms.  This patients had intermittent abdominal pain and discomfort in the upper quadrant. Radiates to some degree into the shoulder blade region. Some nausea with it. No vomiting with it. His been intermittent over the past several days any states to some degree he's felt bloated multiple different times over the past few months plus also his had some intermittent right shoulder blade pain over the past few months  He has a screening colonoscopy coming up next month    Review of Systems  Gastrointestinal: Positive for abdominal pain.  Denies high fever chills sweats denies hematuria rectal bleeding denies chest congestion denies reflux symptoms     Objective:   Physical Exam Lungs clear hearts regular abdomen soft no guarding rebound or tenderness subjective slight discomfort in the right upper quadrant no mass felt he does have what appears to be a possible early lipoma developing on right lower back region near the lower ribs       Assessment & Plan:  Possible lipoma watch this area for gets worse notify us for follow-up  Mild intermittent right upper quadrant discomfort we will do lab work we also set up the patient for ultrasound Follow through with getting colonoscopy After we get back the blood test results we will communicate with gastroenterology it is possible they may want to do an EGD when the patient is present for his colonoscopy in the coming week

## 2016-07-24 ENCOUNTER — Telehealth: Payer: Self-pay | Admitting: Family Medicine

## 2016-07-24 LAB — COMPREHENSIVE METABOLIC PANEL
ALT: 29 IU/L (ref 0–44)
AST: 20 IU/L (ref 0–40)
Albumin/Globulin Ratio: 2.1 (ref 1.2–2.2)
Albumin: 5 g/dL (ref 3.5–5.5)
Alkaline Phosphatase: 74 IU/L (ref 39–117)
BUN/Creatinine Ratio: 11 (ref 9–20)
BUN: 12 mg/dL (ref 6–24)
Bilirubin Total: 0.5 mg/dL (ref 0.0–1.2)
CALCIUM: 9.6 mg/dL (ref 8.7–10.2)
CHLORIDE: 105 mmol/L (ref 96–106)
CO2: 26 mmol/L (ref 18–29)
Creatinine, Ser: 1.06 mg/dL (ref 0.76–1.27)
GFR, EST AFRICAN AMERICAN: 94 mL/min/{1.73_m2} (ref >59)
GFR, EST NON AFRICAN AMERICAN: 81 mL/min/{1.73_m2} (ref >59)
GLUCOSE: 89 mg/dL (ref 65–99)
Globulin, Total: 2.4 g/dL (ref 1.5–4.5)
POTASSIUM: 5.1 mmol/L (ref 3.5–5.2)
Sodium: 145 mmol/L — ABNORMAL HIGH (ref 134–144)
TOTAL PROTEIN: 7.4 g/dL (ref 6.0–8.5)

## 2016-07-24 LAB — CBC WITH DIFFERENTIAL/PLATELET
BASOS ABS: 0.1 10*3/uL (ref 0.0–0.2)
BASOS: 1 %
EOS (ABSOLUTE): 0.1 10*3/uL (ref 0.0–0.4)
Eos: 1 %
Hematocrit: 47.7 % (ref 37.5–51.0)
Hemoglobin: 15.7 g/dL (ref 13.0–17.7)
IMMATURE GRANS (ABS): 0 10*3/uL (ref 0.0–0.1)
IMMATURE GRANULOCYTES: 0 %
LYMPHS: 24 %
Lymphocytes Absolute: 1.8 10*3/uL (ref 0.7–3.1)
MCH: 29.7 pg (ref 26.6–33.0)
MCHC: 32.9 g/dL (ref 31.5–35.7)
MCV: 90 fL (ref 79–97)
MONOS ABS: 0.7 10*3/uL (ref 0.1–0.9)
Monocytes: 10 %
NEUTROS PCT: 64 %
Neutrophils Absolute: 4.8 10*3/uL (ref 1.4–7.0)
PLATELETS: 292 10*3/uL (ref 150–379)
RBC: 5.28 x10E6/uL (ref 4.14–5.80)
RDW: 13.5 % (ref 12.3–15.4)
WBC: 7.5 10*3/uL (ref 3.4–10.8)

## 2016-07-24 LAB — LIPASE: Lipase: 43 U/L (ref 13–78)

## 2016-07-24 NOTE — Addendum Note (Signed)
Addended by: Jeralene PetersREWS, Toshiko Kemler R on: 07/24/2016 09:57 AM   Modules accepted: Orders

## 2016-07-24 NOTE — Telephone Encounter (Signed)
Notified patient RUQ ultrasound scheduled for [facility]/06/16 @ 0815 [facility]. Advised patient to be NPO after midnight.  Patient verbalized understanding.

## 2016-07-25 ENCOUNTER — Encounter (HOSPITAL_COMMUNITY): Payer: Self-pay | Admitting: *Deleted

## 2016-07-28 ENCOUNTER — Encounter (HOSPITAL_COMMUNITY): Payer: Self-pay

## 2016-07-28 ENCOUNTER — Encounter (HOSPITAL_COMMUNITY)
Admission: RE | Disposition: A | Discharge status: Home / Self Care | Payer: Self-pay | Source: Ambulatory Visit | Attending: Internal Medicine

## 2016-07-28 ENCOUNTER — Ambulatory Visit (HOSPITAL_COMMUNITY)
Admission: RE | Admit: 2016-07-28 | Discharge: 2016-07-28 | Disposition: A | Discharge status: Home / Self Care | Payer: BC Managed Care – PPO | Source: Ambulatory Visit | Attending: Internal Medicine | Admitting: Internal Medicine

## 2016-07-28 DIAGNOSIS — K648 Other hemorrhoids: Secondary | ICD-10-CM

## 2016-07-28 DIAGNOSIS — Z79899 Other long term (current) drug therapy: Secondary | ICD-10-CM | POA: Insufficient documentation

## 2016-07-28 DIAGNOSIS — Z1211 Encounter for screening for malignant neoplasm of colon: Secondary | ICD-10-CM | POA: Insufficient documentation

## 2016-07-28 DIAGNOSIS — K644 Residual hemorrhoidal skin tags: Secondary | ICD-10-CM

## 2016-07-28 HISTORY — PX: COLONOSCOPY: SHX5424

## 2016-07-28 HISTORY — DX: Other specified health status: Z78.9

## 2016-07-28 SURGERY — COLONOSCOPY
Anesthesia: Moderate Sedation

## 2016-07-28 MED ORDER — MIDAZOLAM HCL 5 MG/5ML IJ SOLN
INTRAMUSCULAR | Status: AC
  Filled 2016-07-28: qty 10

## 2016-07-28 MED ORDER — STERILE WATER FOR IRRIGATION IR SOLN
Status: DC | PRN
  Administered 2016-07-28: 08:00:00

## 2016-07-28 MED ORDER — MEPERIDINE HCL 50 MG/ML IJ SOLN
INTRAMUSCULAR | Status: AC
  Filled 2016-07-28: qty 1

## 2016-07-28 MED ORDER — MIDAZOLAM HCL 5 MG/5ML IJ SOLN
INTRAMUSCULAR | Status: DC | PRN
  Administered 2016-07-28 (×4): 2 mg via INTRAVENOUS

## 2016-07-28 MED ORDER — SODIUM CHLORIDE 0.9 % IV SOLN
INTRAVENOUS | Status: DC
  Administered 2016-07-28: 07:00:00 via INTRAVENOUS

## 2016-07-28 MED ORDER — MEPERIDINE HCL 50 MG/ML IJ SOLN
INTRAMUSCULAR | Status: DC | PRN
  Administered 2016-07-28 (×2): 25 mg via INTRAVENOUS

## 2016-07-28 NOTE — Op Note (Signed)
[facility] Patient Name: Justin Estrada Procedure Date: 07/28/2016 7:01 AM MRN: [id number] Date of Birth: 1965-04-27 Attending MD: Lionel December , MD CSN: [id number] Age: 51 Admit Type: Outpatient Procedure:                Colonoscopy Indications:              Screening for colorectal malignant neoplasm Providers:                Lionel December, MD, Loma Messing B. Patsy Lager, RN, Dyann Ruddle Referring MD:             Jonna Coup. Gerda Diss, MD Medicines:                Meperidine 50 mg IV, Midazolam 8 mg IV Complications:            No immediate complications. Estimated Blood Loss:     Estimated blood loss: none. Procedure:                Pre-Anesthesia Assessment:                           - Prior to the procedure, a History and Physical                            was performed, and patient medications and                            allergies were reviewed. The patient's tolerance of                            previous anesthesia was also reviewed. The risks                            and benefits of the procedure and the sedation                            options and risks were discussed with the patient.                            All questions were answered, and informed consent                            was obtained. Prior Anticoagulants: The patient has                            taken no previous anticoagulant or antiplatelet                            agents. ASA Grade Assessment: I - A normal, healthy                            patient. After reviewing the risks and benefits,  the patient was deemed in satisfactory condition to                            undergo the procedure.                           After obtaining informed consent, the colonoscope                            was passed under direct vision. Throughout the                            procedure, the patient's blood pressure, pulse, and                            oxygen  saturations were monitored continuously. The                            [id number] ([id number] scope was introduced through                            the anus and advanced to the the cecum, identified                            by appendiceal orifice and ileocecal valve. The                            colonoscopy was performed without difficulty. The                            patient tolerated the procedure well. The quality                            of the bowel preparation was excellent. The                            ileocecal valve, appendiceal orifice, and rectum                            were photographed. Scope In: 7:36:34 AM Scope Out: 7:54:29 AM Scope Withdrawal Time: 0 hours 11 minutes 16 seconds  Total Procedure Duration: 0 hours 17 minutes 55 seconds  Findings:      The perianal exam findings include a skin tag.      The digital rectal exam was normal.      The colon (entire examined portion) appeared normal.      Internal hemorrhoids were found during retroflexion. The hemorrhoids       were small. Impression:               - Perianal skin tag found on perianal exam.                           - The entire examined colon is normal.                           -  Internal hemorrhoids.                           - No specimens collected. Moderate Sedation:      Moderate (conscious) sedation was administered by the endoscopy nurse       and supervised by the endoscopist. The following parameters were       monitored: oxygen saturation, heart rate, blood pressure, CO2       capnography and response to care. Total physician intraservice time was       24 minutes. Recommendation:           - Patient has a contact number available for                            emergencies. The signs and symptoms of potential                            delayed complications were discussed with the                            patient. Return to normal activities tomorrow.                             Written discharge instructions were provided to the                            patient.                           - Resume previous diet today.                           - Continue present medications.                           - Repeat colonoscopy in 10 years for screening                            purposes. Procedure Code(s):        --- Professional ---                           [phone], Colonoscopy, flexible; diagnostic, including                            collection of specimen(s) by brushing or washing,                            when performed (separate procedure)                           99152, Moderate sedation services provided by the                            same physician or other qualified health care  professional performing the diagnostic or                            therapeutic service that the sedation supports,                            requiring the presence of an independent trained                            observer to assist in the monitoring of the                            patient's level of consciousness and physiological                            status; initial 15 minutes of intraservice time,                            patient age 78 years or older                           [phone], Moderate sedation services; each additional                            15 minutes intraservice time Diagnosis Code(s):        --- Professional ---                           Z12.11, Encounter for screening for malignant                            neoplasm of colon                           K64.8, Other hemorrhoids                           K64.4, Residual hemorrhoidal skin tags CPT copyright 2016 [employer]. All rights reserved. The codes documented in this report are preliminary and upon coder review may  be revised to meet current compliance requirements. Lionel December, MD Lionel December, MD 07/28/2016 8:02:39 AM This report has been  signed electronically. Number of Addenda: 0

## 2016-07-28 NOTE — Discharge Instructions (Signed)
Resume usual medications and diet. °No driving for 24 hours. °Next screening exam in 10 years. ° °Colonoscopy, Adult, Care After °This sheet gives you information about how to care for yourself after your procedure. Your health care provider may also give you more specific instructions. If you have problems or questions, contact your health care provider. °What can I expect after the procedure? °After the procedure, it is common to have: °· A small amount of blood in your stool for 24 hours after the procedure. °· Some gas. °· Mild abdominal cramping or bloating. ° °Follow these instructions at home: °General instructions ° °· For the first 24 hours after the procedure: °? Do not drive or use machinery. °? Do not sign important documents. °? Do not drink alcohol. °? Do your regular daily activities at a slower pace than normal. °? Eat soft, easy-to-digest foods. °? Rest often. °· Take over-the-counter or prescription medicines only as told by your health care provider. °· It is up to you to get the results of your procedure. Ask your health care provider, or the department performing the procedure, when your results will be ready. °Relieving cramping and bloating °· Try walking around when you have cramps or feel bloated. °· Apply heat to your abdomen as told by your health care provider. Use a heat source that your health care provider recommends, such as a moist heat pack or a heating pad. °? Place a towel between your skin and the heat source. °? Leave the heat on for 20-30 minutes. °? Remove the heat if your skin turns bright red. This is especially important if you are unable to feel pain, heat, or cold. You may have a greater risk of getting burned. °Eating and drinking °· Drink enough fluid to keep your urine clear or pale yellow. °· Resume your normal diet as instructed by your health care provider. Avoid heavy or fried foods that are hard to digest. °· Avoid drinking alcohol for as long as instructed by  your health care provider. °Contact a health care provider if: °· You have blood in your stool 2-3 days after the procedure. °Get help right away if: °· You have more than a small spotting of blood in your stool. °· You pass large blood clots in your stool. °· Your abdomen is swollen. °· You have nausea or vomiting. °· You have a fever. °· You have increasing abdominal pain that is not relieved with medicine. °This information is not intended to replace advice given to you by your health care provider. Make sure you discuss any questions you have with your health care provider. °Document Released: 09/25/2003 Document Revised: 11/05/2015 Document Reviewed: 04/24/2015 °Elsevier Interactive Patient Education © 2018 [employer]. ° °

## 2016-07-28 NOTE — H&P (Signed)
Justin Estrada is an [age] y.o. male.   Chief Complaint: Patient is here for colonoscopy. HPI: Patient is 51 year old Caucasian male who is here for screening colonoscopy. He denies abdominal pain change in bowel habits or rectal bleeding. Family history is negative for CRC.  Past Medical History:  Diagnosis Date  . Medical history non-contributory     Past Surgical History:  Procedure Laterality Date  . COLONOSCOPY     10 plus years ago by Dr.Rourk  . foot surgery  for bone spurs    . UPPER GASTROINTESTINAL ENDOSCOPY     10 plus years ago by Dr.Rourk    Family History  Problem Relation Age of Onset  . Hypertension Mother   . Hypertension Father    Social History:  reports that he has never smoked. He has never used smokeless tobacco. He reports that he does not drink alcohol or use drugs.  Allergies: No Known Allergies  Medications Prior to Admission  Medication Sig Dispense Refill  . diltiazem 2 % GEL Apply 1 application topically at bedtime. Use as directed. (Patient taking differently: Apply 1 application topically at bedtime as needed (anal fissure). Use as directed.) 30 g 1  . docusate sodium (COLACE) 100 MG capsule Take 1 capsule (100 mg total) by mouth at bedtime.  0  . psyllium (METAMUCIL SMOOTH TEXTURE) 28 % packet Take 1 packet by mouth at bedtime.    . diclofenac (VOLTAREN) 75 MG EC tablet Take 1 tablet by mouth daily as needed for mild pain.       No results found for this or any previous visit (from the past 48 hour(s)). No results found.  ROS  Blood pressure 127/76, pulse 65, temperature 98.7 F (37.1 C), temperature source Oral, resp. rate (!) 9, SpO2 99 %. Physical Exam  Constitutional: He appears well-developed and well-nourished.  HENT:  Mouth/Throat: Oropharynx is clear and moist.  Eyes: Conjunctivae are normal. No scleral icterus.  Neck: No thyromegaly present.  Cardiovascular: Normal rate, regular rhythm and normal heart sounds.   No murmur  heard. Respiratory: Effort normal and breath sounds normal.  GI:  Abdomen is full but soft and nontender without organomegaly or masses.  Musculoskeletal: He exhibits no edema.  Lymphadenopathy:    He has no cervical adenopathy.  Neurological: He is alert.  Skin: Skin is warm and dry.     Assessment/Plan Average risk screening colonoscopy.  Lionel DecemberNajeeb Torri Michalski, MD 07/28/2016, 7:26 AM

## 2016-08-04 ENCOUNTER — Encounter (HOSPITAL_COMMUNITY): Payer: Self-pay | Admitting: Internal Medicine

## 2016-08-06 ENCOUNTER — Ambulatory Visit (HOSPITAL_COMMUNITY)
Admission: RE | Admit: 2016-08-06 | Discharge: 2016-08-06 | Disposition: A | Discharge status: Home / Self Care | Payer: BC Managed Care – PPO | Source: Ambulatory Visit | Attending: Family Medicine | Admitting: Family Medicine

## 2016-08-06 DIAGNOSIS — R1011 Right upper quadrant pain: Secondary | ICD-10-CM | POA: Diagnosis present

## 2016-08-06 DIAGNOSIS — R935 Abnormal findings on diagnostic imaging of other abdominal regions, including retroperitoneum: Secondary | ICD-10-CM | POA: Insufficient documentation

## 2016-12-25 ENCOUNTER — Encounter: Payer: Self-pay | Admitting: Family Medicine

## 2016-12-25 ENCOUNTER — Ambulatory Visit (INDEPENDENT_AMBULATORY_CARE_PROVIDER_SITE_OTHER): Payer: BC Managed Care – PPO | Admitting: Family Medicine

## 2016-12-25 VITALS — BP 110/68 | Temp 97.8°F | Ht 70.5 in | Wt 241.6 lb

## 2016-12-25 DIAGNOSIS — J029 Acute pharyngitis, unspecified: Secondary | ICD-10-CM

## 2016-12-25 DIAGNOSIS — B9689 Other specified bacterial agents as the cause of diseases classified elsewhere: Secondary | ICD-10-CM | POA: Diagnosis not present

## 2016-12-25 DIAGNOSIS — J019 Acute sinusitis, unspecified: Secondary | ICD-10-CM | POA: Diagnosis not present

## 2016-12-25 MED ORDER — AZITHROMYCIN 250 MG PO TABS: ORAL_TABLET | ORAL | 0 refills | Status: DC

## 2016-12-25 NOTE — Progress Notes (Signed)
   Subjective:    Patient ID: Edmonia LynchJames T Otter, male    DOB: 06/17/1965, 51 y.o.   MRN: [id number]  Cough  This is a new problem. The current episode started in the past 7 days. Associated symptoms include a fever, headaches, nasal congestion and a sore throat. He has tried OTC cough suppressant for the symptoms.   Viral-like illness for several days head congestion drainage coughing now with significant sore throat a little bit of hoarseness no wheezing or difficulty breathing   Review of Systems  Constitutional: Positive for fever.  HENT: Positive for sore throat.   Respiratory: Positive for cough.   Neurological: Positive for headaches.       Objective:   Physical Exam Eardrums normal throat is normal neck no masses lungs are clear heart regular voice is hoarse cough is noted no crackles no rhonchi       Assessment & Plan:  Viral syndrome Secondary mild laryngeal voice change related to illness Antibiotics prescribed warning signs discussed follow-up if problems

## 2017-01-26 ENCOUNTER — Telehealth: Payer: Self-pay | Admitting: Family Medicine

## 2017-01-26 MED ORDER — DOXYCYCLINE HYCLATE 100 MG PO TABS: ORAL_TABLET | ORAL | 0 refills | Status: DC

## 2017-01-26 NOTE — Telephone Encounter (Signed)
Patient is aware and we have [facility].

## 2017-01-26 NOTE — Telephone Encounter (Signed)
Nurses-please call the patient.  The patient was seen at the start of November with sinus-like illness.  Patient relates he still has a lingering cough coughing up some congestion.  Let the patient know that some people can have a lingering cough for 2-3 weeks after an illness.  At the same time having an ongoing cough this long could be a sign of some residual sinus infection.  It would be reasonable to do doxycycline 100 mg 1 twice daily take with a snack and a glass of water twice daily for 7 days.  The patient had sent me a text regarding this.  I do not mind that but I am not allowing him to text medical information due to privacy laws.  The patient's phone # [phone] if the patient has ongoing issues he will need to follow-up

## 2017-01-26 NOTE — Addendum Note (Signed)
Addended by: Meredith LeedsSUTTON, Macy Polio L on: 01/26/2017 08:22 AM   Modules accepted: Orders

## 2017-02-23 ENCOUNTER — Encounter: Payer: Self-pay | Admitting: Family Medicine

## 2017-02-23 ENCOUNTER — Ambulatory Visit: Payer: BC Managed Care – PPO | Admitting: Family Medicine

## 2017-02-23 VITALS — BP 128/82 | Ht 70.5 in | Wt 247.4 lb

## 2017-02-23 DIAGNOSIS — G5622 Lesion of ulnar nerve, left upper limb: Secondary | ICD-10-CM

## 2017-02-23 NOTE — Progress Notes (Signed)
   Subjective:    Patient ID: Justin Estrada, male    DOB: 12/31/1965, 51 y.o.   MRN: [id number]  Arm Pain    Patient arrives with c/o left arm pain and numbness for 2-3 weeks. He relates tingling and discomfort into the arm mainly into the index finger and thumb he describes it as a tingling sensation that sometimes radiates up into the forearm he denies any neck pain shoulder pain arm pain he denies weakness he denies clumsiness or difficulty with motion denies tremor PMH benign   Review of Systems denies any chest tightness pressure pain shortness of breath nausea vomiting diarrhea    Objective:   Physical Exam Lungs are clear heart regular pulse normal negative Tinel's negative weakness reflex is good       Assessment & Plan:  More than likely a carpal tunnel recommend brace if that does not help things over the next 3-4 weeks referral to neurology for nerve conduction studies and evaluation for nerve impingement I doubt that this is a cervical impingement more likely at the elbow or the wrist patient was warned that if he gets worse notify us immediately

## 2017-03-23 ENCOUNTER — Ambulatory Visit: Payer: BC Managed Care – PPO | Admitting: Family Medicine

## 2017-03-23 ENCOUNTER — Encounter: Payer: Self-pay | Admitting: Family Medicine

## 2017-03-23 VITALS — BP 138/86 | Temp 103.0°F | Ht 70.5 in | Wt 243.0 lb

## 2017-03-23 DIAGNOSIS — R509 Fever, unspecified: Secondary | ICD-10-CM

## 2017-03-23 DIAGNOSIS — J209 Acute bronchitis, unspecified: Secondary | ICD-10-CM

## 2017-03-23 DIAGNOSIS — J019 Acute sinusitis, unspecified: Secondary | ICD-10-CM | POA: Diagnosis not present

## 2017-03-23 MED ORDER — AZITHROMYCIN 250 MG PO TABS: ORAL_TABLET | ORAL | 0 refills | Status: DC

## 2017-03-23 NOTE — Progress Notes (Signed)
   Subjective:    Patient ID: Justin LynchJames T Estrada, male    DOB: 08/16/1965, 52 y.o.   MRN: [id number]  Sinusitis  This is a new problem. The current episode started yesterday. The maximum temperature recorded prior to his arrival was 103 - 104 F. Associated symptoms include congestion, coughing, headaches and a sore throat. Pertinent negatives include no chills or ear pain. (Fever, bodyaches, chills) Treatments tried: aleve, mucinex.   Viral-like illness multiple days little bit of headache congestion not feeling good denies wheezing difficulty breathing PMH benign    Review of Systems  Constitutional: Negative for activity change, chills and fever.  HENT: Positive for congestion, rhinorrhea and sore throat. Negative for ear pain.   Eyes: Negative for discharge.  Respiratory: Positive for cough. Negative for wheezing.   Cardiovascular: Negative for chest pain.  Gastrointestinal: Negative for nausea and vomiting.  Musculoskeletal: Negative for arthralgias.  Neurological: Positive for headaches.       Objective:   Physical Exam  Constitutional: He appears well-developed.  HENT:  Head: Normocephalic and atraumatic.  Mouth/Throat: Oropharynx is clear and moist. No oropharyngeal exudate.  Eyes: Right eye exhibits no discharge. Left eye exhibits no discharge.  Neck: Normal range of motion.  Cardiovascular: Normal rate, regular rhythm and normal heart sounds.  No murmur heard. Pulmonary/Chest: Effort normal and breath sounds normal. No respiratory distress. He has no wheezes. He has no rales.  Lymphadenopathy:    He has no cervical adenopathy.  Neurological: He exhibits normal muscle tone.  Skin: Skin is warm and dry.  Nursing note and vitals reviewed.         Assessment & Plan:  Viral syndrome Probable influenza Secondary sinus Antibiotics prescribed warning signs discussed Cannot rule out the possibility of the flu but I find no evidence of complications of pneumonia or ear  infection.  Warning signs were discussed in detail

## 2017-03-24 ENCOUNTER — Ambulatory Visit (INDEPENDENT_AMBULATORY_CARE_PROVIDER_SITE_OTHER): Payer: BC Managed Care – PPO | Admitting: Family Medicine

## 2017-03-24 ENCOUNTER — Encounter: Payer: Self-pay | Admitting: Family Medicine

## 2017-03-24 VITALS — BP 130/82 | Ht 70.5 in | Wt 240.6 lb

## 2017-03-24 DIAGNOSIS — Z125 Encounter for screening for malignant neoplasm of prostate: Secondary | ICD-10-CM | POA: Diagnosis not present

## 2017-03-24 DIAGNOSIS — Z0000 Encounter for general adult medical examination without abnormal findings: Secondary | ICD-10-CM

## 2017-03-24 DIAGNOSIS — Z13220 Encounter for screening for lipoid disorders: Secondary | ICD-10-CM

## 2017-03-24 MED ORDER — DICLOFENAC SODIUM 75 MG PO TBEC: 75.0000 mg | DELAYED_RELEASE_TABLET | Freq: Every day | ORAL | 2 refills | Status: DC | PRN

## 2017-03-24 NOTE — Progress Notes (Signed)
   Subjective:    Patient ID: Justin Estrada, male    DOB: 01/29/1966, 52 y.o.   MRN: [id number]  HPI The patient comes in today for a wellness visit.    A review of their health history was completed.  A review of medications was also completed.  Any needed refills; Diclofenac  Eating habits: good  Falls/  MVA accidents in past few months: none  Regular exercise: tries to exercise regular Specialist pt sees on regular basis: gastro  Preventative health issues were discussed.   Additional concerns: none  Review of Systems  Constitutional: Negative for activity change, appetite change and fever.  HENT: Negative for congestion and rhinorrhea.   Eyes: Negative for discharge.  Respiratory: Negative for cough and wheezing.   Cardiovascular: Negative for chest pain.  Gastrointestinal: Negative for abdominal pain, blood in stool and vomiting.  Genitourinary: Negative for difficulty urinating and frequency.  Musculoskeletal: Negative for neck pain.  Skin: Negative for rash.  Allergic/Immunologic: Negative for environmental allergies and food allergies.  Neurological: Negative for weakness and headaches.  Psychiatric/Behavioral: Negative for agitation.       Objective:   Physical Exam  Constitutional: He appears well-developed and well-nourished.  HENT:  Head: Normocephalic and atraumatic.  Right Ear: External ear normal.  Left Ear: External ear normal.  Nose: Nose normal.  Mouth/Throat: Oropharynx is clear and moist.  Eyes: Right eye exhibits no discharge. Left eye exhibits no discharge. No scleral icterus.  Neck: Normal range of motion. Neck supple. No thyromegaly present.  Cardiovascular: Normal rate, regular rhythm and normal heart sounds.  No murmur heard. Pulmonary/Chest: Effort normal and breath sounds normal. No respiratory distress. He has no wheezes.  Abdominal: Soft. Bowel sounds are normal. He exhibits no distension and no mass. There is no tenderness.    Genitourinary: Prostate normal and penis normal.  Musculoskeletal: Normal range of motion. He exhibits no edema.  Lymphadenopathy:    He has no cervical adenopathy.  Neurological: He is alert. He exhibits normal muscle tone. Coordination normal.  Skin: Skin is warm and dry. No erythema.  Psychiatric: He has a normal mood and affect. His behavior is normal. Judgment normal.    Patient was advised to lose weight to get under 220 pounds by watching portions healthy eating and regular activity      Assessment & Plan:  Adult wellness-complete.wellness physical was conducted today. Importance of diet and exercise were discussed in detail. In addition to this a discussion regarding safety was also covered. We also reviewed over immunizations and gave recommendations regarding current immunization needed for age. In addition to this additional areas were also touched on including: Preventative health exams needed: Colonoscopy 2018  Patient was advised yearly wellness exam Labs ordered Patient overall doing well has anal tag he will discuss with his gastroenterologist who he should see to have this removed it is causing significant issues

## 2017-03-28 ENCOUNTER — Encounter (INDEPENDENT_AMBULATORY_CARE_PROVIDER_SITE_OTHER): Payer: Self-pay | Admitting: Internal Medicine

## 2017-03-28 ENCOUNTER — Encounter: Payer: Self-pay | Admitting: Family Medicine

## 2017-03-30 ENCOUNTER — Other Ambulatory Visit: Payer: Self-pay | Admitting: Family Medicine

## 2017-03-30 MED ORDER — AMOXICILLIN-POT CLAVULANATE 875-125 MG PO TABS: 1.0000 | ORAL_TABLET | Freq: Two times a day (BID) | ORAL | 0 refills | Status: DC

## 2017-04-19 LAB — BASIC METABOLIC PANEL
BUN / CREAT RATIO: 12 (ref 9–20)
BUN: 13 mg/dL (ref 6–24)
CO2: 21 mmol/L (ref 20–29)
CREATININE: 1.09 mg/dL (ref 0.76–1.27)
Calcium: 9.3 mg/dL (ref 8.7–10.2)
Chloride: 107 mmol/L — ABNORMAL HIGH (ref 96–106)
GFR calc non Af Amer: 79 mL/min/{1.73_m2} (ref >59)
GFR, EST AFRICAN AMERICAN: 91 mL/min/{1.73_m2} (ref >59)
Glucose: 101 mg/dL — ABNORMAL HIGH (ref 65–99)
Potassium: 5 mmol/L (ref 3.5–5.2)
SODIUM: 143 mmol/L (ref 134–144)

## 2017-04-19 LAB — LIPID PANEL
Chol/HDL Ratio: 4 ratio (ref 0.0–5.0)
Cholesterol, Total: 140 mg/dL (ref 100–199)
HDL: 35 mg/dL — AB (ref >39)
LDL Calculated: 86 mg/dL (ref 0–99)
Triglycerides: 94 mg/dL (ref 0–149)
VLDL Cholesterol Cal: 19 mg/dL (ref 5–40)

## 2017-04-19 LAB — HEPATIC FUNCTION PANEL
ALBUMIN: 4.4 g/dL (ref 3.5–5.5)
ALK PHOS: 77 IU/L (ref 39–117)
ALT: 21 IU/L (ref 0–44)
AST: 15 IU/L (ref 0–40)
BILIRUBIN TOTAL: 0.7 mg/dL (ref 0.0–1.2)
BILIRUBIN, DIRECT: 0.14 mg/dL (ref 0.00–0.40)
TOTAL PROTEIN: 6.7 g/dL (ref 6.0–8.5)

## 2017-04-19 LAB — PSA: Prostate Specific Ag, Serum: 0.4 ng/mL (ref 0.0–4.0)

## 2017-04-21 ENCOUNTER — Ambulatory Visit: Payer: BC Managed Care – PPO | Admitting: Family Medicine

## 2017-04-21 ENCOUNTER — Encounter: Payer: Self-pay | Admitting: Family Medicine

## 2017-04-21 VITALS — BP 128/84 | Ht 70.5 in | Wt 243.4 lb

## 2017-04-21 DIAGNOSIS — B9689 Other specified bacterial agents as the cause of diseases classified elsewhere: Secondary | ICD-10-CM

## 2017-04-21 DIAGNOSIS — J019 Acute sinusitis, unspecified: Secondary | ICD-10-CM

## 2017-04-21 DIAGNOSIS — M25512 Pain in left shoulder: Secondary | ICD-10-CM | POA: Diagnosis not present

## 2017-04-21 MED ORDER — AMOXICILLIN-POT CLAVULANATE 875-125 MG PO TABS: 1.0000 | ORAL_TABLET | Freq: Two times a day (BID) | ORAL | 0 refills | Status: DC

## 2017-04-21 NOTE — Progress Notes (Signed)
   Subjective:    Patient ID: Justin Estrada, male    DOB: 09/20/1965, 52 y.o.   MRN: [id number]  Shoulder Pain   The pain is present in the left shoulder. This is a new problem. The current episode started 1 to 4 weeks ago. The quality of the pain is described as sharp (depends on position; may be aching). The pain is at a severity of 6/10. Associated symptoms include a limited range of motion. Pertinent negatives include no fever. The symptoms are aggravated by activity. Treatments tried: TENS unit(wife has one) The treatment provided mild relief.  Pt fell over ottoman about a week ago. Patient states that he was walking through the living room at night did not seen the furniture tripped over it landed on his chest he was able to get up he does not think he landed on his shoulder but he is not sure he states the following morning he was having a lot of pain discomfort and hurts with movements.  Also relates sinus congestion pressure over the past 3-4 days sinus pressure pain discomfort no wheezing difficulty breathing family with recent flulike illness patient denies high fevers   Review of Systems  Constitutional: Negative for activity change, chills and fever.  HENT: Positive for congestion, rhinorrhea and sinus pressure. Negative for ear pain.   Eyes: Negative for discharge.  Respiratory: Positive for cough. Negative for wheezing.   Cardiovascular: Negative for chest pain.  Gastrointestinal: Negative for nausea and vomiting.  Musculoskeletal: Positive for arthralgias and myalgias.       Objective:   Physical Exam  Constitutional: He appears well-developed.  HENT:  Head: Normocephalic and atraumatic.  Mouth/Throat: Oropharynx is clear and moist. No oropharyngeal exudate.  Eyes: Right eye exhibits no discharge. Left eye exhibits no discharge.  Neck: Normal range of motion.  Cardiovascular: Normal rate, regular rhythm and normal heart sounds.  No murmur heard. Pulmonary/Chest: Effort  normal and breath sounds normal. No respiratory distress. He has no wheezes. He has no rales.  Lymphadenopathy:    He has no cervical adenopathy.  Neurological: He exhibits normal muscle tone.  Skin: Skin is warm and dry.  Nursing note and vitals reviewed. Good range of motion of the rotator cuff with some pain and discomfort with reaching behind his back no weakness detected negative apprehension test        Assessment & Plan:  Patient was seen today for upper respiratory illness. It is felt that the patient is dealing with sinusitis. Antibiotics were prescribed today. Importance of compliance with medication was discussed. Symptoms should gradually resolve over the course of the next several days. If high fevers, progressive illness, difficulty breathing, worsening condition or failure for symptoms to improve over the next several days then the patient is to follow-up. If any emergent conditions the patient is to follow-up in the emergency department otherwise to follow-up in the office.   patient with left shoulder pain discomfort range of motion exercises use anti-inflammatory twice daily over the next couple weeks follow-up with orthopedics possibility of a strain cannot rule out the possibility of a tear if ongoing trouble may need injection possible MRI

## 2017-05-08 ENCOUNTER — Encounter: Payer: Self-pay | Admitting: Family Medicine

## 2017-05-08 ENCOUNTER — Telehealth: Payer: Self-pay | Admitting: Family Medicine

## 2017-05-08 ENCOUNTER — Ambulatory Visit: Payer: BC Managed Care – PPO | Admitting: Family Medicine

## 2017-05-08 ENCOUNTER — Other Ambulatory Visit (HOSPITAL_COMMUNITY)
Admission: RE | Admit: 2017-05-08 | Discharge: 2017-05-08 | Disposition: A | Discharge status: Home / Self Care | Payer: BC Managed Care – PPO | Source: Ambulatory Visit | Attending: Family Medicine | Admitting: Family Medicine

## 2017-05-08 VITALS — BP 138/82 | Ht 70.5 in | Wt 240.6 lb

## 2017-05-08 DIAGNOSIS — R079 Chest pain, unspecified: Secondary | ICD-10-CM | POA: Diagnosis not present

## 2017-05-08 DIAGNOSIS — K2100 Gastro-esophageal reflux disease with esophagitis, without bleeding: Secondary | ICD-10-CM

## 2017-05-08 LAB — TROPONIN I

## 2017-05-08 MED ORDER — PANTOPRAZOLE SODIUM 40 MG PO TBEC: 40.0000 mg | DELAYED_RELEASE_TABLET | Freq: Every day | ORAL | 1 refills | Status: DC

## 2017-05-08 NOTE — Telephone Encounter (Signed)
Spoke with patient and he states that he is feeling fine but has an "indegestion" feel. 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 Pt states that he did not feel great on Wednesday and did have what felt like flutters in his heartbeat. Nurse gave Tums and he states that helped a little bit but the feeling is coming back. Pt states he his not having any pain other than the pain in his arm from his fall, not nauseated. States the feeling comes and goes. Pt verbalized that he knew the symptoms of heart attack and he doesn't feel this is related to heart attack. Please advise.

## 2017-05-08 NOTE — Telephone Encounter (Signed)
Pt called stating that he has slight discomfort in his chest. Pt is at work and was checked out by the school nurse. Pt states that it feels like indigestion. Pt was told to touch base with Dr. [name] to see what he recommends the pt do. Pt would like to speak to Dr. [name] regarding this when he gets time if possible. Please advise.

## 2017-05-08 NOTE — Telephone Encounter (Signed)
There is no way that I can safely evaluate this by phone.  It is necessary for the patient to be seen.  I am willing to work him in if he can come over

## 2017-05-08 NOTE — Progress Notes (Addendum)
   Subjective:    Patient ID: Justin Estrada, male    DOB: 11/03/1965, 52 y.o.   MRN: [id number]  Chest Pain   This is a new problem. The current episode started today. Pertinent negatives include no abdominal pain, cough, fever, headaches, nausea, palpitations, shortness of breath or weakness.  He describes a unusual slight ache in his epigastrium or lower chest started earlier today.  He states he does walking on a regular basis with his job over 10,000 steps a day denies any chest tightness pressure pain with.  Recent cholesterol profile which showed HDL slightly low but LDL very good He states when he does a moderate activity (steps,walking) he does not experience chest tightness or shortness of breath He denies any pain up into the mid chest denies any substernal pressure denies tingling or numbness into the arms or into the neck He does state he drinks the equivalent of 2 cups of coffee each morning he also eats chocolate candy daily and he had pizza last night.  He thinks it regurgitation issues/heartburn He was seen by school nurse who gave him tums-it helped a little PMH benign  Review of Systems  Constitutional: Negative for activity change, fatigue and fever.  HENT: Negative for congestion and rhinorrhea.   Respiratory: Negative for cough and shortness of breath.   Cardiovascular: Positive for chest pain. Negative for palpitations and leg swelling.  Gastrointestinal: Negative for abdominal pain, diarrhea and nausea.  Genitourinary: Negative for dysuria and hematuria.  Neurological: Negative for weakness and headaches.  Psychiatric/Behavioral: Negative for behavioral problems.  Patient symptomatology states it is not true chest pain gets more of a funny sensation in the epigastrium lower chest EKG no acute changes were noted normal EKG     Objective:   Physical Exam  Constitutional: He appears well-nourished. No distress.  HENT:  Head: Normocephalic and atraumatic.  Eyes: Right  eye exhibits no discharge. Left eye exhibits no discharge.  Neck: No tracheal deviation present.  Cardiovascular: Normal rate, regular rhythm and normal heart sounds.  No murmur heard. Pulmonary/Chest: Effort normal and breath sounds normal. No respiratory distress. He has no wheezes.  Abdominal: Soft. He exhibits no distension. There is no tenderness. There is no rebound.  Musculoskeletal: He exhibits no edema.  Lymphadenopathy:    He has no cervical adenopathy.  Neurological: He is alert.  Skin: Skin is warm. No rash noted.  Psychiatric: His behavior is normal.  Vitals reviewed.   EKG no acute changes normal EKG no other EKG to compare to Does not sound like angina If MI given number of hours would expect Troponin to be elevated     Assessment & Plan:  Unusual chest sensation more than likely heartburn/GERD.  PPI daily.  Maalox as needed.  Mody ordered for today await the results this is ordered stat Patient was talked to regarding cardiac symptoms if he starts having needs to immediately go to ER Troponin I Came back negative Patient will follow-up if ongoing troubles he would give us update in a few days how he is doing

## 2017-05-08 NOTE — Telephone Encounter (Signed)
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

## 2017-05-11 ENCOUNTER — Ambulatory Visit: Payer: BC Managed Care – PPO | Admitting: Orthopedic Surgery

## 2017-05-11 ENCOUNTER — Ambulatory Visit (INDEPENDENT_AMBULATORY_CARE_PROVIDER_SITE_OTHER): Payer: BC Managed Care – PPO

## 2017-05-11 VITALS — HR 60 | Ht 70.0 in | Wt 240.0 lb

## 2017-05-11 DIAGNOSIS — M25512 Pain in left shoulder: Secondary | ICD-10-CM | POA: Diagnosis not present

## 2017-05-11 DIAGNOSIS — S46109A Unspecified injury of muscle, fascia and tendon of long head of biceps, unspecified arm, initial encounter: Secondary | ICD-10-CM

## 2017-05-11 NOTE — Progress Notes (Signed)
NEW PATIENT OFFICE VISIT   Chief Complaint  Patient presents with  . Shoulder Pain    Shoulder pain, fell 3 weeks ago.    52-year-old male presents for evaluation of left shoulder pain  This 52 year old male tells [place] that he fell getting out of bed tripped over a ottoman and ottoman.  He is not sure how he landed he woke up the next morning with left shoulder pain over the biceps anterior shoulder somewhat over the posterior portion of the left proximal humerus  He complains of dull pain left proximal humerus and biceps times 3 weeks associated with painful supination and flexion of the humerus.  He says he was getting better until he started moving some boxes with his brother clearing out his parents home after the passed away last year as they had passed away last year.    Review of Systems  All other systems reviewed and are negative.    Past Medical History:  Diagnosis Date  . Medical history non-contributory     Past Surgical History:  Procedure Laterality Date  . COLONOSCOPY     10 plus years ago by Dr.Rourk  . COLONOSCOPY N/A 07/28/2016   Procedure: COLONOSCOPY;  Surgeon: Malissa Hippo, MD;  Location: [facility];  Service: Endoscopy;  Laterality: N/A;  730-rescheduled to 6/4 same time per Dewayne Hatch  . foot surgery  for bone spurs    . UPPER GASTROINTESTINAL ENDOSCOPY     10 plus years ago by Dr.Rourk    Family History  Problem Relation Age of Onset  . Hypertension Mother   . Hypertension Father    Social History   Tobacco Use  . Smoking status: Never Smoker  . Smokeless tobacco: Never Used  Substance Use Topics  . Alcohol use: No    Alcohol/week: 0.0 oz  . Drug use: No    No outpatient medications have been marked as taking for the 05/11/17 encounter (Office Visit) with Vickki Hearing, MD.    Pulse 60   Ht 5\' 10"  (1.778 m)   Wt 240 lb (108.9 kg)   BMI 34.44 kg/m   Physical Exam  Constitutional: He is oriented to person, place, and time. He  appears well-developed and well-nourished.  Vital signs have been reviewed and are stable. Gen. appearance the patient is well-developed and well-nourished with normal grooming and hygiene.   Musculoskeletal:  GAIT IS nomal  Neurological: He is alert and oriented to person, place, and time.  Skin: Skin is warm and dry. No erythema.  Psychiatric: He has a normal mood and affect.  Vitals reviewed.   Right Shoulder Exam  Right shoulder exam is normal.  Tenderness  The patient is experiencing no tenderness.  Range of Motion  The patient has normal right shoulder ROM.  Muscle Strength  The patient has normal right shoulder strength.  Tests  Apprehension: negative  Other  Erythema: absent Sensation: normal Pulse: present   Left Shoulder Exam  Left shoulder exam is normal.  Tenderness  The patient is experiencing tenderness in the biceps tendon.  Range of Motion  The patient has normal left shoulder ROM.  Muscle Strength  The patient has normal left shoulder strength.  Tests  Apprehension: negative  Other  Erythema: absent Sensation: normal Pulse: present   Comments:  No Popeye sign is noted no instability is noted no apprehension is noted no impingement is noted.  He does have mild tenderness over the coracoid left shoulder with painful supination against resistance  and painful Speed test      MEDICAL DECISION SECTION  xrays ordered?  Yes  My independent reading of xrays: See my report, no fracture dislocation type II acromion mild inferior osteophytes glenoid without joint space narrowing   Encounter Diagnoses  Name Primary?  . Pain in joint of left shoulder   . Injury of long head of biceps Yes     PLAN:   We are recommending symptomatic treatment no heavy lifting for 3 weeks, if he does not improve over 3-4 weeks then MRI can be done to evaluate his glenohumeral joint for any intra-articular pathology   Injection?  No MRI/CT/?  No

## 2017-05-14 ENCOUNTER — Encounter: Payer: Self-pay | Admitting: Family Medicine

## 2017-05-17 ENCOUNTER — Encounter: Payer: Self-pay | Admitting: Orthopedic Surgery

## 2017-05-19 ENCOUNTER — Encounter (INDEPENDENT_AMBULATORY_CARE_PROVIDER_SITE_OTHER): Payer: Self-pay | Admitting: Internal Medicine

## 2017-05-19 ENCOUNTER — Ambulatory Visit (INDEPENDENT_AMBULATORY_CARE_PROVIDER_SITE_OTHER): Payer: BC Managed Care – PPO | Admitting: Internal Medicine

## 2017-05-19 VITALS — BP 128/82 | HR 68 | Temp 97.6°F | Resp 18 | Ht 71.0 in | Wt 241.4 lb

## 2017-05-19 DIAGNOSIS — Z8719 Personal history of other diseases of the digestive system: Secondary | ICD-10-CM

## 2017-05-19 DIAGNOSIS — K644 Residual hemorrhoidal skin tags: Secondary | ICD-10-CM

## 2017-05-19 DIAGNOSIS — K602 Anal fissure, unspecified: Secondary | ICD-10-CM | POA: Diagnosis not present

## 2017-05-19 NOTE — Progress Notes (Signed)
Presenting complaint;  History of anal fissure.  Subjective:  Justin Estrada is 52 year old Caucasian male who is here for scheduled visit.  He was last seen in March 2018 and he underwent colonoscopy on 07/28/2016 which revealed internal hemorrhoids and single anal skin tag.  He has a history of anal fissure and responded to diltiazem gel.  He says he has not had any problems but he needs diltiazem gel just in case. He continues to complain of irritation due to anal tag.  He notices soreness every now and then.  He would like to have his tag removed if possible.  He denies rectal bleeding.  He generally has 2-3 soft stools daily.  He has occasional blood on the tissue.  He has been taking diclofenac for left shoulder pain.  About 12 days ago he was seen by Dr. Gerda DissLuking for postprandial chest pain.  He was felt to have GERD and begun on pantoprazole.  He has not had chest pain anymore.  He is supposed to call him with progress report next week.    Current Medications: Outpatient Encounter Medications as of 05/19/2017  Medication Sig  . diclofenac (VOLTAREN) 75 MG EC tablet Take 1 tablet (75 mg total) by mouth daily as needed for mild pain.  Marland Kitchen. diltiazem 2 % GEL Apply 1 application topically at bedtime. Use as directed. (Patient taking differently: Apply 1 application topically at bedtime as needed (anal fissure). Use as directed.)  . docusate sodium (COLACE) 100 MG capsule Take 1 capsule (100 mg total) by mouth at bedtime.  . pantoprazole (PROTONIX) 40 MG tablet Take 1 tablet (40 mg total) by mouth daily.  . psyllium (METAMUCIL SMOOTH TEXTURE) 28 % packet Take 1 packet by mouth at bedtime.   No facility-administered encounter medications on file as of 05/19/2017.      Objective: Blood pressure 128/82, pulse 68, temperature 97.6 F (36.4 C), temperature source Oral, resp. rate 18, height 5\' 11"  (1.803 m), weight 241 lb 6.4 oz (109.5 kg). Patient is alert and in no acute distress. Conjunctiva is pink.  Sclera is nonicteric Oropharyngeal mucosa is normal. No neck masses or thyromegaly noted. Cardiac exam with regular rhythm normal S1 and S2. No murmur or gallop noted. Lungs are clear to auscultation. Abdomen is full but soft and nontender without organomegaly or masses. Rectal examination was limited to external inspection and he has single anal skin tag posteriorly.  There is no skin excoriation. No LE edema or clubbing noted.  Assessment:  #1.  History of anal fissure.  He is presently symptom-free.  Medication will be refilled just in case he needs it in future.  #2.  Anal skin tag.  He has mild symptoms in the form of intermittent soreness.  Will refer him to Dr. Henreitta LeberBridges.  Plan:  New prescription given for diltiazem gel. Consultation with Dr. Henreitta LeberBridges for excision of anal skin tag which is bothersome to the patient. Office visit on as-needed basis. Next screening colonoscopy in June 2028.

## 2017-05-19 NOTE — Patient Instructions (Signed)
Will make an appointment with Dr. Henreitta LeberBridges for banding of anal skin tag. Next screening colonoscopy would be in June 2028.

## 2017-05-20 MED ORDER — DILTIAZEM GEL 2 %: 1.0000 "application " | Freq: Every evening | CUTANEOUS | 1 refills | Status: DC | PRN

## 2017-06-15 ENCOUNTER — Encounter (INDEPENDENT_AMBULATORY_CARE_PROVIDER_SITE_OTHER): Payer: Self-pay | Admitting: Internal Medicine

## 2017-06-15 ENCOUNTER — Telehealth (INDEPENDENT_AMBULATORY_CARE_PROVIDER_SITE_OTHER): Payer: Self-pay | Admitting: *Deleted

## 2017-06-15 DIAGNOSIS — G8929 Other chronic pain: Secondary | ICD-10-CM

## 2017-06-15 DIAGNOSIS — R1011 Right upper quadrant pain: Principal | ICD-10-CM

## 2017-06-15 NOTE — Telephone Encounter (Signed)
-----   Message from Mychart, Generic sent at 06/15/2017 10:16 AM EDT -----    I am having a sick feeling in my stomach and where my sternum is located. It has been like this since Friday. I tried the acid blocker that Dr. Gerda DissLuking prescribed but that does not seem to help. What do you suggest. Thank you.    Carroll Sageodd Lynde

## 2017-06-15 NOTE — Telephone Encounter (Signed)
Per Dr.Rehman the patient is to take Pantoprazole 40 mg twice a day.  Arrange U/S of the RUQ , if this is negative will precede with EGD. Patient has been made aware.

## 2017-06-15 NOTE — Addendum Note (Signed)
Addended by: Shona NeedlesDD, Uzziah Rigg M on: 06/15/2017 03:57 PM   Modules accepted: Orders

## 2017-06-16 NOTE — Telephone Encounter (Signed)
US sch'd 06/19/17 [phone] ([id number]), npo after midnight, left detailed message for patient

## 2017-06-18 ENCOUNTER — Ambulatory Visit: Payer: BC Managed Care – PPO | Admitting: General Surgery

## 2017-06-18 VITALS — BP 146/79 | HR 73 | Temp 97.1°F | Resp 18 | Ht 71.0 in | Wt 237.0 lb

## 2017-06-18 DIAGNOSIS — K644 Residual hemorrhoidal skin tags: Secondary | ICD-10-CM

## 2017-06-18 NOTE — Patient Instructions (Addendum)
Pending your US findings tomorrow. No gallstones on [place] 2012.   Cholelithiasis Cholelithiasis is a form of gallbladder disease in which gallstones form in the gallbladder. The gallbladder is an organ that stores bile. Bile is made in the liver, and it helps to digest fats. Gallstones begin as small crystals and slowly grow into stones. They may cause no symptoms until the gallbladder tightens (contracts) and a gallstone is blocking the duct (gallbladder attack), which can cause pain. Cholelithiasis is also referred to as gallstones. There are two main types of gallstones:  Cholesterol stones. These are made of hardened cholesterol and are usually yellow-green in color. They are the most common type of gallstone. Cholesterol is a white, waxy, fat-like substance that is made in the liver.  Pigment stones. These are dark in color and are made of a red-yellow substance that forms when hemoglobin from red blood cells breaks down (bilirubin).  What are the causes? This condition may be caused by an imbalance in the substances that bile is made of. This can happen if the bile:  Has too much bilirubin.  Has too much cholesterol.  Does not have enough bile salts. These salts help the body absorb and digest fats.  In some cases, this condition can also be caused by the gallbladder not emptying completely or often enough. What increases the risk? The following factors may make you more likely to develop this condition:  Being male.  Having multiple pregnancies. Health care providers sometimes advise removing diseased gallbladders before future pregnancies.  Eating a diet that is heavy in fried foods, fat, and refined carbohydrates, like white bread and white rice.  Being obese.  Being older than age 79.  Prolonged use of medicines that contain male hormones (estrogen).  Having diabetes mellitus.  Rapidly losing weight.  Having a family history of gallstones.  Being of [place]  or [place] descent.  Having an intestinal disease such as Crohn disease.  Having metabolic syndrome.  Having cirrhosis.  Having severe types of anemia such as sickle cell anemia.  What are the signs or symptoms? In most cases, there are no symptoms. These are known as silent gallstones. If a gallstone blocks the bile ducts, it can cause a gallbladder attack. The main symptom of a gallbladder attack is sudden pain in the upper right abdomen. The pain usually comes at night or after eating a large meal. The pain can last for one or several hours and can spread to the right shoulder or chest. If the bile duct is blocked for more than a few hours, it can cause infection or inflammation of the gallbladder, liver, or pancreas, which may cause:  Nausea.  Vomiting.  Abdominal pain that lasts for 5 hours or more.  Fever or chills.  Yellowing of the skin or the whites of the eyes (jaundice).  Dark urine.  Light-colored stools.  How is this diagnosed? This condition may be diagnosed based on:  A physical exam.  Your medical history.  An ultrasound of your gallbladder.  CT scan.  MRI.  Blood tests to check for signs of infection or inflammation.  A scan of your gallbladder and bile ducts (biliary system) using nonharmful radioactive material and special cameras that can see the radioactive material (cholescintigram). This test checks to see how your gallbladder contracts and whether bile ducts are blocked.  Inserting a small tube with a camera on the end (endoscope) through your mouth to inspect bile ducts and check for blockages (endoscopic retrograde  cholangiopancreatogram).  How is this treated? Treatment for gallstones depends on the severity of the condition. Silent gallstones do not need treatment. If the gallstones cause a gallbladder attack or other symptoms, treatment may be required. Options for treatment include:  Surgery to remove the gallbladder (cholecystectomy).  This is the most common treatment.  Medicines to dissolve gallstones. These are most effective at treating small gallstones. You may need to take medicines for up to 6-12 months.  Shock wave treatment (extracorporeal biliary lithotripsy). In this treatment, an ultrasound machine sends shock waves to the gallbladder to break gallstones into smaller pieces. These pieces can then be passed into the intestines or be dissolved by medicine. This is rarely used.  Removing gallstones through endoscopic retrograde cholangiopancreatogram. A small basket can be attached to the endoscope and used to capture and remove gallstones.  Follow these instructions at home:  Take over-the-counter and prescription medicines only as told by your health care provider.  Maintain a healthy weight and follow a healthy diet. This includes: ? Reducing fatty foods, such as fried food. ? Reducing refined carbohydrates, like white bread and white rice. ? Increasing fiber. Aim for foods like almonds, fruit, and beans.  Keep all follow-up visits as told by your health care provider. This is important. Contact a health care provider if:  You think you have had a gallbladder attack.  You have been diagnosed with silent gallstones and you develop abdominal pain or indigestion. Get help right away if:  You have pain from a gallbladder attack that lasts for more than 2 hours.  You have abdominal pain that lasts for more than 5 hours.  You have a fever or chills.  You have persistent nausea and vomiting.  You develop jaundice.  You have dark urine or light-colored stools. Summary  Cholelithiasis (also called gallstones) is a form of gallbladder disease in which gallstones form in the gallbladder.  This condition is caused by an imbalance in the substances that make up bile. This can happen if the bile has too much cholesterol, too much bilirubin, or not enough bile salts.  You are more likely to develop this  condition if you are male, pregnant, using medicines with estrogen, obese, older than age 51, or have a family history of gallstones. You may also develop gallstones if you have diabetes, an intestinal disease, cirrhosis, or metabolic syndrome.  Treatment for gallstones depends on the severity of the condition. Silent gallstones do not need treatment.  If gallstones cause a gallbladder attack or other symptoms, treatment may be needed. The most common treatment is surgery to remove the gallbladder. This information is not intended to replace advice given to you by your health care provider. Make sure you discuss any questions you have with your health care provider. Document Released: 02/06/2005 Document Revised: 10/28/2015 Document Reviewed: 10/28/2015 Elsevier Interactive Patient Education  2018 [employer]

## 2017-06-18 NOTE — Progress Notes (Signed)
[facility] History and Physical  Reason for Referral:Perianal Skin Tag  Referring Physician: Dr. Karilyn Cotaehman   Chief Complaint    Skin Problem      Justin Estrada is a 52 y.o. male.  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 Karilyn Cotaehman back in 07/2016 that demonstrated internal hemorrhoids and the perianal skin tag.  He reports that this causes him little problems, but that it can be annoying at times and causes some hygiene issues. He is seen by Dr. Gerda DissLuking for his PCP and is otherwise healthy.  His fissure he reports is controlled at this time, requiring minimal use of the topical cream Dr. Karilyn Cotaehman gave him.  Reporting that he will use it about every month to every 2 months. He keeps his stools soft with fiber supplements.  He has had some minor bleeding from the anus but nothing that is consistent.   Recently he has noticed some RUQ pain and nausea with eating certain fatty foods. He is being worked up by Dr. Karilyn Cotaehman for gallstones.  He had an US in 2012 that demonstrated no stones.  He has been taking a PPI twice daily and it is helping his symptoms.   Past Medical History:  Diagnosis Date  . Medical history non-contributory     Past Surgical History:  Procedure Laterality Date  . COLONOSCOPY     10 plus years ago by Dr.Rourk  . COLONOSCOPY N/A 07/28/2016   Procedure: COLONOSCOPY;  Surgeon: Malissa Hippoehman, Najeeb U, MD;  Location: [facility];  Service: Endoscopy;  Laterality: N/A;  730-rescheduled to 6/4 same time per Justin HatchAnn  . foot surgery  for bone spurs    . UPPER GASTROINTESTINAL ENDOSCOPY     10 plus years ago by Dr.Rourk    Family History  Problem Relation Age of Onset  . Hypertension Mother   . Hypertension Father     Social History   Tobacco Use  . Smoking status: Never Smoker  . Smokeless tobacco: Never Used  Substance Use Topics  . Alcohol use: No    Alcohol/week: 0.0 oz  . Drug  use: No    Medications: I have reviewed the patient's current medications. Allergies as of 06/18/2017   No Known Allergies     Medication List        Accurate as of 06/18/17 11:59 PM. Always use your most recent med list.          diclofenac 75 MG EC tablet Commonly known as:  VOLTAREN Take 1 tablet (75 mg total) by mouth daily as needed for mild pain.   diltiazem 2 % Gel Apply 1 application topically at bedtime as needed (anal fissure). Use as directed.   docusate sodium 100 MG capsule Commonly known as:  COLACE Take 1 capsule (100 mg total) by mouth at bedtime.   pantoprazole 40 MG tablet Commonly known as:  PROTONIX Take 1 tablet (40 mg total) by mouth daily.   psyllium 28 % packet Commonly known as:  METAMUCIL SMOOTH TEXTURE Take 1 packet by mouth at bedtime.        ROS:  A comprehensive review of systems was negative except for: Gastrointestinal: positive for abdominal pain, nausea, reflux symptoms and hygiene issues in perianal region  Blood pressure (!) 146/79, pulse 73, temperature (!) 97.1 F (36.2 C), resp. rate 18, height 5\' 11"  (1.803 m), weight 237 lb (107.5 kg).  Physical Exam  Constitutional: He is oriented to person, place, and time. He appears well-developed and well-nourished.  HENT:  Head: Normocephalic and atraumatic.  Eyes: Pupils are equal, round, and reactive to light. EOM are normal.  Neck: Normal range of motion. Neck supple.  Cardiovascular: Normal rate and regular rhythm.  Pulmonary/Chest: Effort normal and breath sounds normal.  Abdominal: Soft. He exhibits no distension. There is no tenderness.  Genitourinary: Rectum normal.  Genitourinary Comments: Posterior anal skin tag, about 1.5cm long, no evidence of any condylomatous changes, no fissure currently, tone slightly increased on DRE  Musculoskeletal: Normal range of motion. He exhibits no edema or deformity.  Neurological: He is alert and oriented to person, place, and time.  Skin:  Skin is warm and dry.  Psychiatric: He has a normal mood and affect. His behavior is normal. Judgment and thought content normal.  Vitals reviewed.   Results: Colonoscopy 07/2016- internal hemorrhoids, skin tag [place] pending tomorrow d  Assessment & Plan:  Justin Estrada is a 52 y.o. 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  He has had fissure in the past but this sounds relatively controlled and is not causing him pain or discomfort.  He uses topical creams about once a month.  He reports getting the cream from [place], so I assume it has a mixture of diltiazem to help with sphincter tone.  His tone is slightly increased, but no evidence of fissure today.  We discussed that this skin tag is related to the body trying to heal the fissure over time. We discussed that removal of this skin tag will not change the etiology of a tight sphincter muscle causing his fissure but that if his symptoms are controlled that a surgery for the fissure is likely not needed at this time.  He could however continue to have fissure symptoms, and ultimately decide to get a lateral internal sphincterotomy to heal the fissure if it returns.   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    Lucretia Roers 06/19/2017, 10:44 AM

## 2017-06-19 ENCOUNTER — Ambulatory Visit (HOSPITAL_COMMUNITY)
Admission: RE | Admit: 2017-06-19 | Discharge: 2017-06-19 | Disposition: A | Discharge status: Home / Self Care | Payer: BC Managed Care – PPO | Source: Ambulatory Visit | Attending: Internal Medicine | Admitting: Internal Medicine

## 2017-06-19 ENCOUNTER — Encounter: Payer: Self-pay | Admitting: General Surgery

## 2017-06-19 DIAGNOSIS — G8929 Other chronic pain: Secondary | ICD-10-CM | POA: Diagnosis not present

## 2017-06-19 DIAGNOSIS — R1011 Right upper quadrant pain: Secondary | ICD-10-CM | POA: Diagnosis not present

## 2017-06-22 ENCOUNTER — Encounter (INDEPENDENT_AMBULATORY_CARE_PROVIDER_SITE_OTHER): Payer: Self-pay | Admitting: Internal Medicine

## 2017-07-21 ENCOUNTER — Emergency Department (HOSPITAL_COMMUNITY)
Admission: EM | Admit: 2017-07-21 | Discharge: 2017-07-22 | Disposition: A | Discharge status: Home / Self Care | Payer: BC Managed Care – PPO | Attending: Emergency Medicine | Admitting: Emergency Medicine

## 2017-07-21 ENCOUNTER — Encounter (HOSPITAL_COMMUNITY): Payer: Self-pay | Admitting: *Deleted

## 2017-07-21 ENCOUNTER — Encounter: Payer: Self-pay | Admitting: Orthopedic Surgery

## 2017-07-21 ENCOUNTER — Emergency Department (HOSPITAL_COMMUNITY): Payer: BC Managed Care – PPO

## 2017-07-21 ENCOUNTER — Encounter (INDEPENDENT_AMBULATORY_CARE_PROVIDER_SITE_OTHER): Payer: Self-pay | Admitting: Internal Medicine

## 2017-07-21 DIAGNOSIS — R0789 Other chest pain: Secondary | ICD-10-CM

## 2017-07-21 DIAGNOSIS — Z79899 Other long term (current) drug therapy: Secondary | ICD-10-CM | POA: Insufficient documentation

## 2017-07-21 DIAGNOSIS — R079 Chest pain, unspecified: Secondary | ICD-10-CM | POA: Insufficient documentation

## 2017-07-21 LAB — BASIC METABOLIC PANEL
ANION GAP: 9 (ref 5–15)
BUN: 15 mg/dL (ref 6–20)
CALCIUM: 9.2 mg/dL (ref 8.9–10.3)
CHLORIDE: 106 mmol/L (ref 101–111)
CO2: 26 mmol/L (ref 22–32)
CREATININE: 1.02 mg/dL (ref 0.61–1.24)
GFR calc non Af Amer: >60 mL/min (ref >60)
Glucose, Bld: 86 mg/dL (ref 65–99)
Potassium: 3.9 mmol/L (ref 3.5–5.1)
SODIUM: 141 mmol/L (ref 135–145)

## 2017-07-21 LAB — CBC
HCT: 45.9 % (ref 39.0–52.0)
Hemoglobin: 15.6 g/dL (ref 13.0–17.0)
MCH: 30.4 pg (ref 26.0–34.0)
MCHC: 34 g/dL (ref 30.0–36.0)
MCV: 89.3 fL (ref 78.0–100.0)
PLATELETS: 262 10*3/uL (ref 150–400)
RBC: 5.14 MIL/uL (ref 4.22–5.81)
RDW: 12.7 % (ref 11.5–15.5)
WBC: 6.8 10*3/uL (ref 4.0–10.5)

## 2017-07-21 LAB — TROPONIN I

## 2017-07-21 NOTE — ED Triage Notes (Signed)
Pt with discomfort to mid cp, burping more frequently.  Pt denies sob or Nausea.

## 2017-07-22 LAB — I-STAT TROPONIN, ED: TROPONIN I, POC: 0.01 ng/mL (ref 0.00–0.08)

## 2017-07-22 NOTE — Discharge Instructions (Addendum)
Please resume taking your pantoprazole (Protonix) every day. Follow up with the cardiologist, and with your gastroenterologist.

## 2017-07-22 NOTE — ED Provider Notes (Signed)
[facility] EMERGENCY DEPARTMENT Provider Note   CSN: [id number] Arrival date & time: 07/21/17  2003     History   Chief Complaint Chief Complaint  Patient presents with  . Chest Pain    HPI Justin Estrada is a 52 y.o. male.  The history is provided by the patient.  He has no significant past history, and comes in with vague epigastric/lower chest discomfort which started about noon.  He is unable to describe it, but it is not heavy or tight or pressure.  Discomfort is very mild and he rates it at 3/10.  There is no associated dyspnea, nausea, diaphoresis.  Nothing makes it better, nothing makes it worse.  He has been having similar symptoms intermittently over the last 2 months and seem to be related to eating.  This episode was different and that it did not get better.  He had seen his primary care provider and his gastroenterologist and had been placed on a proton pump inhibitor.  That initially seemed to be helping, but had seemed to stop working.  He had not taken the proton pump inhibitor for the last 2 days.  Symptoms have resolved since arriving in the hospital.  He is a non-smoker and has no history of diabetes or hypertension or hyperlipidemia.  Family history is positive for a grandfather who had coronary disease onset in his low 67s.  Past Medical History:  Diagnosis Date  . Medical history non-contributory     Patient Active Problem List   Diagnosis Date Noted  . Special screening for malignant neoplasms, colon 04/30/2016  . Chronic low back pain 09/09/2012  . Fissure in ano 02/24/2012  . Abdominal pain, acute, right upper quadrant 08/04/2011    Past Surgical History:  Procedure Laterality Date  . COLONOSCOPY     10 plus years ago by Dr.Rourk  . COLONOSCOPY N/A 07/28/2016   Procedure: COLONOSCOPY;  Surgeon: Malissa Hippo, MD;  Location: [facility];  Service: Endoscopy;  Laterality: N/A;  730-rescheduled to 6/4 same time per Dewayne Hatch  . foot surgery  for bone spurs     . UPPER GASTROINTESTINAL ENDOSCOPY     10 plus years ago by Dr.Rourk        Home Medications    Prior to Admission medications   Medication Sig Start Date End Date Taking? Authorizing Provider  diltiazem 2 % GEL Apply 1 application topically at bedtime as needed (anal fissure). Use as directed. 05/20/17  Yes Rehman, Joline Maxcy, MD  docusate sodium (COLACE) 100 MG capsule Take 1 capsule (100 mg total) by mouth at bedtime. 04/20/12  Yes Rehman, Joline Maxcy, MD  pantoprazole (PROTONIX) 40 MG tablet Take 1 tablet (40 mg total) by mouth daily. Patient taking differently: Take 40 mg by mouth daily as needed (for GERD).  05/08/17  Yes Luking, Scott A, MD  psyllium (METAMUCIL SMOOTH TEXTURE) 28 % packet Take 1 packet by mouth at bedtime. 04/20/12  Yes Rehman, Joline Maxcy, MD    Family History Family History  Problem Relation Age of Onset  . Hypertension Mother   . Hypertension Father     Social History Social History   Tobacco Use  . Smoking status: Never Smoker  . Smokeless tobacco: Never Used  Substance Use Topics  . Alcohol use: No    Alcohol/week: 0.0 oz  . Drug use: No     Allergies   Patient has no known allergies.   Review of Systems Review of Systems  All  other systems reviewed and are negative.    Physical Exam Updated Vital Signs BP 127/80   Pulse (!) 55   Temp 98 F (36.7 C) (Oral)   Resp 12   Ht  (1.803 m)   Wt 104.3 kg (230 lb)   SpO2 99%   BMI 32.08 kg/m   Physical Exam  Nursing note and vitals reviewed.  52 year old male, resting comfortably and in no acute distress. Vital signs are significant for mild bradycardia. Oxygen saturation is 99%, which is normal. Head is normocephalic and atraumatic. PERRLA, EOMI. Oropharynx is clear. Neck is nontender and supple without adenopathy or JVD. Back is nontender and there is no CVA tenderness. Lungs are clear without rales, wheezes, or rhonchi. Chest is nontender. Heart has regular rate and rhythm  without murmur. Abdomen is soft, flat, nontender without masses or hepatosplenomegaly and peristalsis is normoactive. Extremities have no cyanosis or edema, full range of motion is present. Skin is warm and dry without rash. Neurologic: Mental status is normal, cranial nerves are intact, there are no motor or sensory deficits.  ED Treatments / Results  Labs (all labs ordered are listed, but only abnormal results are displayed) Labs Reviewed  BASIC METABOLIC PANEL  CBC  TROPONIN I  I-STAT TROPONIN, ED    EKG EKG Interpretation  Date/Time:  Tuesday Jul 21 2017 20:14:26 EDT Ventricular Rate:  62 PR Interval:  166 QRS Duration: 104 QT Interval:  390 QTC Calculation: 395 R Axis:   13 Text Interpretation:  Normal sinus rhythm Normal ECG No old tracing to compare Confirmed by Dione Booze ([id number]) on 07/22/2017 12:18:50 AM   Radiology Dg Chest 2 View  Result Date: 07/21/2017 CLINICAL DATA:  Chest discomfort EXAM: CHEST - 2 VIEW COMPARISON:  None. FINDINGS: Lungs are clear.  No pleural effusion or pneumothorax. The heart is normal in size. Visualized osseous structures are within normal limits. IMPRESSION: Normal chest radiographs. Electronically Signed   By: Charline Bills M.D.   On: 07/21/2017 20:52    Procedures Procedures   Medications Ordered in ED Medications - No data to display   Initial Impression / Assessment and Plan / ED Course  I have reviewed the triage vital signs and the nursing notes.  Pertinent labs & imaging results that were available during my care of the patient were reviewed by me and considered in my medical decision making (see chart for details).  Epigastric/lower chest discomfort of uncertain cause.  History is more suggestive of GERD.  Old records are reviewed, and he first saw his PCP for this on March 15.  He has seen his gastroenterologist and had a gallbladder ultrasound which showed small polyps but no gallstones.  I doubt polyps are related to  his symptoms.  ECG is normal and chest x-ray is normal.  Initial troponin is normal.  Will check delta troponin.  If negative, will recommend he go back on his proton pump inhibitor and refer to cardiology for consideration for outpatient stress testing.  Heart score is 2, which places him at low risk for major adverse cardiac events in the next 30 days.  Repeat troponin is normal.  He is instructed to take his pantoprazole every day, follow-up with his gastroenterologist, and is referred to cardiology for consideration of stress testing.  Final Clinical Impressions(s) / ED Diagnoses   Final diagnoses:  Chest discomfort    ED Discharge Orders    None       Dione Booze, MD 07/22/17  [id number]  

## 2017-07-30 ENCOUNTER — Encounter: Payer: Self-pay | Admitting: Cardiovascular Disease

## 2017-07-30 ENCOUNTER — Encounter (INDEPENDENT_AMBULATORY_CARE_PROVIDER_SITE_OTHER): Payer: Self-pay | Admitting: Internal Medicine

## 2017-08-06 ENCOUNTER — Encounter: Payer: Self-pay | Admitting: Family Medicine

## 2017-08-06 ENCOUNTER — Ambulatory Visit: Payer: BC Managed Care – PPO | Admitting: Family Medicine

## 2017-08-06 VITALS — BP 138/90 | Ht 71.0 in | Wt 236.0 lb

## 2017-08-06 DIAGNOSIS — K219 Gastro-esophageal reflux disease without esophagitis: Secondary | ICD-10-CM

## 2017-08-06 MED ORDER — DEXLANSOPRAZOLE 60 MG PO CPDR: 60.0000 mg | DELAYED_RELEASE_CAPSULE | Freq: Every day | ORAL | 4 refills | Status: DC

## 2017-08-06 NOTE — Progress Notes (Signed)
   Subjective:    Patient ID: Justin Estrada, male    DOB: 11/13/1965, 52 y.o.   MRN: [id number]  Gastroesophageal Reflux  He complains of abdominal pain. He reports no chest pain, no coughing or no nausea. discomfort in sternum area. This is a recurrent problem. Pertinent negatives include no fatigue.  Pt went to ER on Jul 21 2017 for chest discomfort. Pt states he is not taking the Protonix as directed. Pt states he is still having some issues  I discussed this with the patient he was having some epigastric discomfort was not having any true substernal tightness some of the epigastric discomfort radiated into the chest it bothered him enough to where he went to the ER they did testing they released him he has an appointment with gastroenterology and an appointment with cardiology  Cardiology will see him first do evaluation then gastroenterology says they will see him after he is had cardiac issues ruled out patient denies any chest tightness pressure pain denies excessive stress does not always eat as healthy as he should and knows he needs to lose some weight  Review of Systems  Constitutional: Negative for activity change, appetite change and fatigue.  HENT: Negative for congestion and rhinorrhea.   Respiratory: Negative for cough, chest tightness and shortness of breath.   Cardiovascular: Negative for chest pain and leg swelling.  Gastrointestinal: Positive for abdominal pain. Negative for diarrhea and nausea.  Endocrine: Negative for polydipsia and polyphagia.  Genitourinary: Negative for dysuria and hematuria.  Neurological: Negative for weakness and headaches.  Psychiatric/Behavioral: Negative for confusion and dysphoric mood.       Objective:   Physical Exam  Constitutional: He appears well-nourished. No distress.  Cardiovascular: Normal rate, regular rhythm and normal heart sounds.  No murmur heard. Pulmonary/Chest: Effort normal and breath sounds normal. No respiratory  distress.  Abdominal: Soft. He exhibits no distension and no mass. There is tenderness. There is no guarding.  Musculoskeletal: He exhibits no edema.  Lymphadenopathy:    He has no cervical adenopathy.  Neurological: He is alert.  Psychiatric: His behavior is normal.  Vitals reviewed.   Slight epigastric tenderness      Assessment & Plan:  Reflux-switch to Dexilant follow closely-none seen cardiology then patient will be seen by gastroenterology and more than likely they will do EGD  The patient has ongoing trouble may need a HIDA risk of right sided pain intermittent  Intermittent chest pain likely gastroenterologic but I do believe follow through with cardiology is a good idea.  Patient does have family history of cardiomyopathy.  More than likely will need stress potentially having a stress echo.  Patient was encouraged once his cardiac clearance occurs to get motivated with exercise watch weight watching diet keeping cholesterol blood pressure under good control  Patient has appointment in July with cardiology if possible he would like to have testing done sooner but I told him this was dependent on cardiology schedules

## 2017-08-12 ENCOUNTER — Encounter: Payer: Self-pay | Admitting: Family Medicine

## 2017-08-25 ENCOUNTER — Encounter: Payer: Self-pay | Admitting: General Surgery

## 2017-08-25 ENCOUNTER — Ambulatory Visit: Payer: BC Managed Care – PPO | Admitting: General Surgery

## 2017-08-25 VITALS — BP 146/78 | HR 79 | Temp 97.3°F | Resp 18 | Wt 236.0 lb

## 2017-08-25 DIAGNOSIS — K644 Residual hemorrhoidal skin tags: Secondary | ICD-10-CM | POA: Diagnosis not present

## 2017-08-25 NOTE — Patient Instructions (Signed)
Perianal skin tag removal and exam under anesthesia to assess for possible fistula tracks.   Anal Fistula An anal fistula is an abnormal tunnel that develops between the bowel and the skin near the outside of the anus, where stool (feces) comes out. The anus has many tiny glands that make lubricating fluid. Sometimes, these glands become plugged and infected, and that can cause a fluid-filled pocket (abscess) to form. An anal fistula often develops after this infection or abscess. What are the causes? In most cases, an anal fistula is caused by a past or current anal abscess. Other causes include:  A complication of surgery.  Trauma to the rectal area.  Radiation to the area.  Medical conditions or diseases, such as: ? Chronic inflammatory bowel disease, such as Crohn disease or ulcerative colitis. ? Colon cancer or rectal cancer. ? Diverticular disease, such as diverticulitis. ? An STD (sexually transmitted disease), such as gonorrhea, chlamydia, or syphilis. ? An infection that is caused by HIV (human immunodeficiency virus). ? Foreign body in the rectum.  What are the signs or symptoms? Symptoms of this condition include:  Throbbing or constant pain that may be worse while you are sitting.  Swelling or irritation around the anus.  Drainage of pus or blood from an opening near the anus.  Pain with bowel movements.  Fever or chills.  How is this diagnosed? Your health care provider will examine the area to find the openings of the anal fistula and the fistula tract. The external opening of the anal fistula may be seen during a physical exam. You may also have tests, including:  An exam of the rectal area with a gloved hand (digital rectal exam).  An exam with a probe or scope to help locate the internal opening of the fistula.  Imaging tests to find the exact location and path of the fistula. These tests may include X-rays, an ultrasound, a CT scan, or MRI. The path is made  visible by a dye that is injected into the fistula opening.  You may have other tests to find the cause of the anal fistula. How is this treated? The most common treatment for an anal fistula is surgery. The type of surgery that is used will depend on where the fistula is located and how complex the fistula is. Surgical options include:  A fistulotomy. The whole fistula is opened up, and the contents are drained to promote healing.  Seton placement. A silk string (seton) is placed into the fistula during a fistulotomy. This helps to drain any infection to promote healing.  Advancement flap procedure. Tissue is removed from your rectum or the skin around the anus and is attached to the opening of the fistula.  Bioprosthetic plug. A cone-shaped plug is made from your tissue and is used to block the opening of the fistula.  Some anal fistulas do not require surgery. A nonsurgical treatment option involves injecting a fibrin glue to seal the fistula. You also may be prescribed an antibiotic medicine to treat an infection. Follow these instructions at home: Medicines  Take over-the-counter and prescription medicines only as told by your health care provider.  If you were prescribed an antibiotic medicine, take it as told by your health care provider. Do not stop taking the antibiotic even if you start to feel better.  Use a stool softener or a laxative if told to do so by your health care provider. General instructions  Eat a high-fiber diet as told by your  health care provider. This can help to prevent constipation.  Drink enough fluid to keep your urine clear or pale yellow.  Take a warm sitz bath for 15-20 minutes, 3-4 times per day, or as told by your health care provider. Sitz baths can ease your pain and discomfort and help with healing.  Follow good hygiene to keep the anal area as clean and dry as possible. Use wet toilet paper or a moist towelette after each bowel movement.  Keep  all follow-up visits as told by your health care provider. This is important. Contact a health care provider if:  You have increased pain that is not controlled with medicines.  You have new redness or swelling around the anal area.  You have new fluid, blood, or pus coming from the anal area.  You have tenderness or warmth around the anal area. Get help right away if:  You have a fever.  You have severe pain.  You have chills or diarrhea.  You have severe problems urinating or having a bowel movement. This information is not intended to replace advice given to you by your health care provider. Make sure you discuss any questions you have with your health care provider. Document Released: 01/24/2008 Document Revised: 07/19/2015 Document Reviewed: 05/08/2014 Elsevier Interactive Patient Education  2018 [employer]  Anal Fistulotomy Anal fistulotomy is a surgical procedure to open and drain an anal fistula. An anal fistula is an abnormal tunnel that develops between the bowel and the skin near the outside of the anus, where stool (feces) comes out. During this procedure, the fistula is opened up and the contents are drained to promote healing. Tell a health care provider about:  Any allergies you have.  All medicines you are taking, including vitamins, herbs, eye drops, creams, and over-the-counter medicines.  Any problems you or family members have had with anesthetic medicines.  Any blood disorders you have.  Any surgeries you have had.  Any medical conditions you have.  Whether you are pregnant or may be pregnant. What are the risks? Generally, this is a safe procedure. However, problems may occur, including:  Infection.  Bleeding.  Allergic reactions to medicines or dyes.  Damage to other structures or organs.  Not being able to control when you have bowel movements (incontinence).  Being unable to empty your bladder (urinary retention).  Medicines  Ask  your health care provider about: ? Changing or stopping your regular medicines. This is especially important if you are taking diabetes medicines or blood thinners. ? Taking medicines such as aspirin and ibuprofen. These medicines can thin your blood. Do not take these medicines before your procedure if your health care provider instructs you not to.  You may be given antibiotic medicine to help prevent infection. General Instructions  You may have an exam or testing.  You may have a blood or urine sample taken.  You may be instructed to take a laxative or use an enema to clean your bowels before surgery.  Plan to have someone take you home from the hospital or clinic.  If you will be going home right after the procedure, plan to have someone with you for 24 hours. What happens during the procedure?  To reduce your risk of infection: ? Your health care team will wash or sanitize their hands. ? Your skin will be washed with soap.  An IV tube will be inserted into one of your veins to give you fluids and medicines.  You will be  given one or more of the following: ? A medicine to help you relax (sedative). ? A medicine to numb the area (local anesthetic). ? A medicine to make you fall asleep (general anesthetic).  Your surgeon will locate the internal opening of your fistula.  An incision will be made in the fistula opening. The incision may extend into the muscles around the anus (sphincter muscles).  The fistula will be cut open and drained.  The sides of the fistula may be stitched (sutured) to the sides of the incision.  Gauze bandages (dressings) may be placed inside of the fistula. The procedure may vary among health care providers and hospitals. What happens after the procedure?  Your blood pressure, heart rate, breathing rate, and blood oxygen level will be monitored until the medicines you were given have worn off.  You may have to wear compression stockings. These  stockings help to prevent blood clots and reduce swelling in your legs.  Do not drive for 24 hours if you received a sedative.  You may continue to receive fluids and medicines through an IV tube.  You may have some bleeding from the surgical area. You may have to wear an absorbent pad. This information is not intended to replace advice given to you by your health care provider. Make sure you discuss any questions you have with your health care provider. Document Released: 06/05/2015 Document Revised: 09/20/2015 Document Reviewed: 06/05/2015 Elsevier Interactive Patient Education  [employer].

## 2017-08-25 NOTE — Progress Notes (Signed)
[facility] History and Physical  Reason for Referral: Perianal Skin Tag  Referring Physician:  Dr. Lujean Raveehman    Sebastiano Leitha Schuller Guadalupe is a 52 y.o. male.  HPI: Mr. 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 Karilyn Cotaehman back in 07/2016 that demonstrated internal hemorrhoids and the perianal skin tag.  He reports that this causes him little problems, but that it can be annoying at times and causes some hygiene issues. He is seen by Dr. Gerda DissLuking for his PCP and is otherwise healthy.  He saw me a few months ago and is following up.  He reports that his fissure is controlled at this time, requiring minimal use of the topical cream Dr. Karilyn Cotaehman gave him.  He says that he will use it about every month to every 2 months. He keeps his stools soft with fiber supplements.  He has had some minor bleeding from the anus but nothing that is consistent but does report a blood blister like swelling that occurs around the area of the skin tag at times.  He denies any prior abscess formation.   Recently he has noticed some RUQ pain and nausea with eating certain fatty foods. He is being worked up by Dr. Karilyn Cotaehman for gallstones.  He is going to get an EGD in the future, and due to this pain had to go to the ED. The ED has referred him to see cardiology, and there are plans for a stress test per his report.  He has cardiology Follow up August 31, 2017.    Past Medical History:  Diagnosis Date  . Medical history non-contributory     Past Surgical History:  Procedure Laterality Date  . COLONOSCOPY     10 plus years ago by Dr.Rourk  . COLONOSCOPY N/A 07/28/2016   Procedure: COLONOSCOPY;  Surgeon: Malissa Hippoehman, Najeeb U, MD;  Location: [facility];  Service: Endoscopy;  Laterality: N/A;  730-rescheduled to 6/4 same time per Dewayne HatchAnn  . foot surgery  for bone spurs    . UPPER GASTROINTESTINAL ENDOSCOPY     10 plus years ago by Dr.Rourk    Family History    Problem Relation Age of Onset  . Hypertension Mother   . Hypertension Father     Social History   Tobacco Use  . Smoking status: Never Smoker  . Smokeless tobacco: Never Used  Substance Use Topics  . Alcohol use: No    Alcohol/week: 0.0 oz  . Drug use: No    Medications: I have reviewed the patient's current medications. Allergies as of 08/25/2017   No Known Allergies     Medication List        Accurate as of 08/25/17  9:14 AM. Always use your most recent med list.          dexlansoprazole 60 MG capsule Commonly known as:  DEXILANT Take 1 capsule (60 mg total) by mouth daily.   diltiazem 2 % Gel Apply 1 application topically at bedtime as needed (anal fissure). Use as directed.   docusate sodium 100 MG capsule Commonly known as:  COLACE Take 1 capsule (100 mg total) by mouth at bedtime.   psyllium 28 % packet Commonly known as:  METAMUCIL SMOOTH TEXTURE Take 1 packet by mouth at bedtime.        ROS:  A comprehensive review of systems was negative except for: Gastrointestinal: positive for epigastric/ sternal pain/ getting cardiology referral  perianal skin tag and some bleeding/ swelling times  Blood pressure (!) 146/78, pulse 79, temperature (!) 97.3 F (36.3 C), temperature source Temporal, resp. rate 18, weight 236 lb (107 kg). Physical Exam  Constitutional: He is oriented to person, place, and time. He appears well-developed.  HENT:  Head: Normocephalic.  Eyes: Pupils are equal, round, and reactive to light.  Neck: Normal range of motion.  Cardiovascular: Normal rate and regular rhythm.  Pulmonary/Chest: Effort normal and breath sounds normal.  Abdominal: Soft. He exhibits no distension. There is no tenderness.  Genitourinary:  Genitourinary Comments: Posterior anal skin tag as prior, no signs of drainage/ abscess or fistula, DRE deferred today  Musculoskeletal: Normal range of motion. He exhibits no edema.  Neurological: He is alert and oriented to  person, place, and time.  Skin: Skin is warm and dry.  Vitals reviewed.   Results: None   Assessment & Plan:  SAVIAN MAZON is a 52 y.o. 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  He has had fissure in the past but this sounds relatively controlled and is not causing him pain or discomfort.  He uses topical creams about once a month.   We discussed that this skin tag is related to the body trying to heal the fissure over time. We discussed that removal of this skin tag will not change the etiology of a tight sphincter muscle causing his fissure but that if his symptoms are controlled that a surgery for the fissure is likely not needed at this time. We also discussed this question of swelling/ bleeding. given this I think we should do an exam under anesthesia to assess for any fistula formation at that same time as the skin tag removal.  We discussed that fistulas can cause bleeding /drainage and swelling but if he never had an abscess it would be unlikely.    -Seeing cardiology, will arrange for surgery in August so that this evaluation can be completed  -EUA and skin tag removal, will address any fistula with fistulotomy/ seton as needed at that time, do not think his fissure symptoms warrant sphincterotomy at this time given minimal symptoms and use of the creams   -Discussed risk and benefits of skin tag removal including but not limited to bleeding, infection, and plan for sending off to pathology to confirm no condylomatous changes   All questions were answered to the satisfaction of the patient.   Lucretia Roers 08/25/2017, 9:14 AM

## 2017-08-26 DIAGNOSIS — K644 Residual hemorrhoidal skin tags: Secondary | ICD-10-CM

## 2017-08-26 NOTE — H&P (Signed)
[facility] History and Physical  Reason for Referral: Perianal Skin Tag  Referring Physician:  Dr. Rehman    Justin Estrada is a 52 y.o. male.  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 Rehman back in 07/2016 that demonstrated internal hemorrhoids and the perianal skin tag.  Justin Estrada reports that this causes him little problems, but that it can be annoying at times and causes some hygiene issues. Justin Estrada is seen by Dr. Luking for his PCP and is otherwise healthy.  Justin Estrada saw me a few months ago and is following up.  Justin Estrada reports that his fissure is controlled at this time, requiring minimal use of the topical cream Dr. Rehman gave him.  Justin Estrada says that Justin Estrada will use it about every month to every 2 months. Justin Estrada keeps his stools soft with fiber supplements.  Justin Estrada has had some minor bleeding from the anus but nothing that is consistent but does report a blood blister like swelling that occurs around the area of the skin tag at times.  Justin Estrada denies any prior abscess formation.   Recently Justin Estrada has noticed some RUQ pain and nausea with eating certain fatty foods. Justin Estrada is being worked up by Dr. Rehman for gallstones.  Justin Estrada is going to get an EGD in the future, and due to this pain had to go to the ED. The ED has referred him to see cardiology, and there are plans for a stress test per his report.  Justin Estrada has cardiology Follow up August 31, 2017.    Past Medical History:  Diagnosis Date  . Medical history non-contributory     Past Surgical History:  Procedure Laterality Date  . COLONOSCOPY     10 plus years ago by Dr.Rourk  . COLONOSCOPY N/A 07/28/2016   Procedure: COLONOSCOPY;  Surgeon: Rehman, Najeeb U, MD;  Location: [facility];  Service: Endoscopy;  Laterality: N/A;  730-rescheduled to 6/4 same time per Ann  . foot surgery  for bone spurs    . UPPER GASTROINTESTINAL ENDOSCOPY     10 plus years ago by Dr.Rourk    Family History    Problem Relation Age of Onset  . Hypertension Mother   . Hypertension Father     Social History   Tobacco Use  . Smoking status: Never Smoker  . Smokeless tobacco: Never Used  Substance Use Topics  . Alcohol use: No    Alcohol/week: 0.0 oz  . Drug use: No    Medications: I have reviewed the patient's current medications. Allergies as of 08/25/2017   No Known Allergies     Medication List        Accurate as of 08/25/17  9:14 AM. Always use your most recent med list.          dexlansoprazole 60 MG capsule Commonly known as:  DEXILANT Take 1 capsule (60 mg total) by mouth daily.   diltiazem 2 % Gel Apply 1 application topically at bedtime as needed (anal fissure). Use as directed.   docusate sodium 100 MG capsule Commonly known as:  COLACE Take 1 capsule (100 mg total) by mouth at bedtime.   psyllium 28 % packet Commonly known as:  METAMUCIL SMOOTH TEXTURE Take 1 packet by mouth at bedtime.        ROS:  A comprehensive review of systems was negative except for: Gastrointestinal: positive for epigastric/ sternal pain/ getting cardiology referral    perianal skin tag and some bleeding/ swelling times  Blood pressure (!) 146/78, pulse 79, temperature (!) 97.3 F (36.3 C), temperature source Temporal, resp. rate 18, weight 236 lb (107 kg). Physical Exam  Constitutional: Justin Estrada is oriented to person, place, and time. Justin Estrada appears well-developed.  HENT:  Head: Normocephalic.  Eyes: Pupils are equal, round, and reactive to light.  Neck: Normal range of motion.  Cardiovascular: Normal rate and regular rhythm.  Pulmonary/Chest: Effort normal and breath sounds normal.  Abdominal: Soft. Justin Estrada exhibits no distension. There is no tenderness.  Genitourinary:  Genitourinary Comments: Posterior anal skin tag as prior, no signs of drainage/ abscess or fistula, DRE deferred today  Musculoskeletal: Normal range of motion. Justin Estrada exhibits no edema.  Neurological: Justin Estrada is alert and oriented to  person, place, and time.  Skin: Skin is warm and dry.  Vitals reviewed.   Results: None   Assessment & Plan:  Justin Estrada is a 52 y.o. 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  Justin Estrada has had fissure in the past but this sounds relatively controlled and is not causing him pain or discomfort.  Justin Estrada uses topical creams about once a month.   We discussed that this skin tag is related to the body trying to heal the fissure over time. We discussed that removal of this skin tag will not change the etiology of a tight sphincter muscle causing his fissure but that if his symptoms are controlled that a surgery for the fissure is likely not needed at this time. We also discussed this question of swelling/ bleeding. given this I think we should do an exam under anesthesia to assess for any fistula formation at that same time as the skin tag removal.  We discussed that fistulas can cause bleeding /drainage and swelling but if Justin Estrada never had an abscess it would be unlikely.    -Seeing cardiology, will arrange for surgery in August so that this evaluation can be completed  -EUA and skin tag removal, will address any fistula with fistulotomy/ seton as needed at that time, do not think his fissure symptoms warrant sphincterotomy at this time given minimal symptoms and use of the creams   -Discussed risk and benefits of skin tag removal including but not limited to bleeding, infection, and plan for sending off to pathology to confirm no condylomatous changes   All questions were answered to the satisfaction of the patient.   Quay Simkin C Doretha Goding 08/25/2017, 9:14 AM       

## 2017-08-31 ENCOUNTER — Ambulatory Visit: Payer: BC Managed Care – PPO | Admitting: Cardiovascular Disease

## 2017-08-31 ENCOUNTER — Encounter

## 2017-08-31 ENCOUNTER — Encounter: Payer: Self-pay | Admitting: *Deleted

## 2017-08-31 ENCOUNTER — Encounter: Payer: Self-pay | Admitting: Cardiovascular Disease

## 2017-08-31 ENCOUNTER — Encounter: Payer: Self-pay | Admitting: Orthopedic Surgery

## 2017-08-31 VITALS — BP 124/86 | HR 70 | Ht 71.0 in | Wt 236.0 lb

## 2017-08-31 DIAGNOSIS — K219 Gastro-esophageal reflux disease without esophagitis: Secondary | ICD-10-CM | POA: Diagnosis not present

## 2017-08-31 DIAGNOSIS — Z9289 Personal history of other medical treatment: Secondary | ICD-10-CM

## 2017-08-31 DIAGNOSIS — R0789 Other chest pain: Secondary | ICD-10-CM

## 2017-08-31 NOTE — Patient Instructions (Signed)
Medication Instructions:  Your physician recommends that you continue on your current medications as directed. Please refer to the Current Medication list given to you today.   Labwork: NONE   Testing/Procedures: Your physician has requested that you have a stress echocardiogram. For further information please visit [email or web address] Please follow instruction sheet as given.    Follow-Up: Your physician recommends that you schedule a follow-up appointment with Dr. Purvis SheffieldKoneswaran.    Any Other Special Instructions Will Be Listed Below (If Applicable).     If you need a refill on your cardiac medications before your next appointment, please call your pharmacy.  Thank you for choosing  [facility]!

## 2017-08-31 NOTE — Progress Notes (Signed)
CARDIOLOGY CONSULT NOTE  Patient ID: Justin Estrada MRN: [id number] DOB/AGE: [birth year] 52 y.o.  Admit date: (Not on file) Primary Physician: Babs Sciara, MD Referring Physician: Babs Sciara, MD  Reason for Consultation: Chest pain  HPI: Justin Estrada is a 52 y.o. male who is being seen today for the evaluation of chest pain at the request of Luking, Jonna Coup, MD.   He was evaluated in the ED at [facility] for chest pain on 07/21/2017.  I personally reviewed all relevant documentation, labs, and studies.  At that time he described a epigastric/lower chest discomfort.  There is no associated shortness of breath or diaphoresis.  There were no specific aggravating or alleviating factors.  It appeared to be related to eating.  He has been on a proton pump inhibitor but he felt it stopped working and he had not been on one for 2 days at the time of ED presentation.  Overall, it was felt that his symptoms were suggestive of GERD. He was instructed to take Protonix every day.  Relevant labs: Normal troponins, normal CBC, normal basic metabolic panel.  Chest x-ray was normal.  I reviewed an abdominal ultrasound dated 06/19/2017 which showed multiple gallbladder polyps with no gallstones or biliary distention.  I personally reviewed the ECG performed on 07/21/2017 which was essentially normal with no significant ischemic ST segment or T wave abnormalities, nor any arrhythmias.  He was once walking at night in the dark and tripped over an ottoman and injured his left shoulder.  He has had some left shoulder pain anteriorly and posteriorly with some associated chest discomfort since then.  He has some subxiphoid tenderness when bending over a couch.  He is trying to exercise a little bit more as he wants to get under 220 pounds by the end of the year.  He is already lost 6 or 7 pounds.  He denies exertional dyspnea.  He denies exertional fatigue.  He has an occasional palpitation.   He denies leg swelling, orthopnea, and paroxysmal nocturnal dyspnea.  Social history: Both of his parents were my patients.  They are deceased.  His older brother Virl Diamond used to come to those appointments with his parents.  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    No Known Allergies  Current Outpatient Medications  Medication Sig Dispense Refill  . dexlansoprazole (DEXILANT) 60 MG capsule Take 1 capsule (60 mg total) by mouth daily. 30 capsule 4  . diltiazem 2 % GEL Apply 1 application topically at bedtime as needed (anal fissure). Use as directed. 30 g 1  . docusate sodium (COLACE) 100 MG capsule Take 1 capsule (100 mg total) by mouth at bedtime.  0  . psyllium (METAMUCIL SMOOTH TEXTURE) 28 % packet Take 1 packet by mouth at bedtime.     No current facility-administered medications for this visit.     Past Medical History:  Diagnosis Date  . Medical history non-contributory     Past Surgical History:  Procedure Laterality Date  . COLONOSCOPY     10 plus years ago by Dr.Rourk  . COLONOSCOPY N/A 07/28/2016   Procedure: COLONOSCOPY;  Surgeon: Malissa Hippo, MD;  Location: [facility];  Service: Endoscopy;  Laterality: N/A;  730-rescheduled to 6/4 same time per Dewayne Hatch  . foot surgery  for bone spurs    . UPPER GASTROINTESTINAL ENDOSCOPY     10  plus years ago by Dr.Rourk    Social History   Socioeconomic History  . Marital status: Married    Spouse name: Not on file  . Number of children: Not on file  . Years of education: Not on file  . Highest education level: Not on file  Occupational History  . Not on file  Social Needs  . Financial resource strain: Not on file  . Food insecurity:    Worry: Not on file    Inability: Not on file  . Transportation needs:    Medical: Not on file    Non-medical: Not on file  Tobacco Use  . Smoking status: Never Smoker  . Smokeless tobacco: Never Used  Substance  and Sexual Activity  . Alcohol use: No    Alcohol/week: 0.0 oz  . Drug use: No  . Sexual activity: Not on file  Lifestyle  . Physical activity:    Days per week: Not on file    Minutes per session: Not on file  . Stress: Not on file  Relationships  . Social connections:    Talks on phone: Not on file    Gets together: Not on file    Attends religious service: Not on file    Active member of club or organization: Not on file    Attends meetings of clubs or organizations: Not on file    Relationship status: Not on file  . Intimate partner violence:    Fear of current or ex partner: Not on file    Emotionally abused: Not on file    Physically abused: Not on file    Forced sexual activity: Not on file  Other Topics Concern  . Not on file  Social History Narrative  . Not on file     No family history of premature CAD in 1st degree relatives.  No outpatient medications have been marked as taking for the 08/31/17 encounter (Office Visit) with Laqueta LindenKoneswaran, Trino Higinbotham A, MD.      Review of systems complete and found to be negative unless listed above in HPI    Physical exam Blood pressure 124/86, pulse 70, height 5\' 11"  (1.803 m), weight 236 lb (107 kg), SpO2 98 %. General: NAD Neck: No JVD, no thyromegaly or thyroid nodule.  Lungs: Clear to auscultation bilaterally with normal respiratory effort. CV: Nondisplaced PMI. Regular rate and rhythm, normal S1/S2, no S3/S4, no murmur.  No peripheral edema.  No carotid bruit.   Abdomen: Soft, nontender, no distention, protuberant.  Skin: Intact without lesions or rashes.  Neurologic: Alert and oriented x 3.  Psych: Normal affect. Extremities: No clubbing or cyanosis.  HEENT: Normal.   ECG: Most recent ECG reviewed.   Labs: Lab Results  Component Value Date/Time   K 3.9 07/21/2017 09:34 PM   BUN 15 07/21/2017 09:34 PM   BUN 13 04/18/2017 08:57 AM   CREATININE 1.02 07/21/2017 09:34 PM   ALT 21 04/18/2017 08:57 AM   HGB 15.6  07/21/2017 09:34 PM   HGB 15.7 07/23/2016 04:56 PM     Lipids: Lab Results  Component Value Date/Time   LDLCALC 86 04/18/2017 08:57 AM   CHOL 140 04/18/2017 08:57 AM   TRIG 94 04/18/2017 08:57 AM   HDL 35 (L) 04/18/2017 08:57 AM        ASSESSMENT AND PLAN:   1.  Chest discomfort: Symptoms appear to be somewhat atypical.  He seldom has palpitations.  He does have associated belching which points to a gastrointestinal etiology.  I will obtain a stress echocardiogram to evaluate for inducible ischemia.  2.  GERD: He cannot identify any specific foods which trigger acid reflux disease.  Is currently on Dexilant 60 mg daily.  He is due to follow-up with GI.   Disposition: Follow up in next few months  Signed: Prentice Docker, M.D., F.A.C.C.  08/31/2017, 1:52 PM

## 2017-09-08 ENCOUNTER — Ambulatory Visit (HOSPITAL_COMMUNITY)
Admission: RE | Admit: 2017-09-08 | Discharge: 2017-09-08 | Disposition: A | Discharge status: Home / Self Care | Payer: BC Managed Care – PPO | Source: Ambulatory Visit | Attending: Cardiovascular Disease | Admitting: Cardiovascular Disease

## 2017-09-08 ENCOUNTER — Encounter: Payer: Self-pay | Admitting: Cardiovascular Disease

## 2017-09-08 DIAGNOSIS — R0789 Other chest pain: Secondary | ICD-10-CM | POA: Diagnosis present

## 2017-09-08 LAB — ECHOCARDIOGRAM STRESS TEST
CHL CUP RESTING HR STRESS: 64 {beats}/min
CHL RATE OF PERCEIVED EXERTION: 16
CSEPED: 8 min
CSEPEDS: 2 s
CSEPEW: 10.1 METS
CSEPPHR: 176 {beats}/min
MPHR: 169 {beats}/min
Percent HR: 104 %

## 2017-09-08 NOTE — Progress Notes (Signed)
*  PRELIMINARY RESULTS* Echocardiogram Echocardiogram Stress Test has been performed.  Stacey DrainWhite, Jaimin Krupka J 09/08/2017, 10:24 AM

## 2017-09-09 ENCOUNTER — Encounter (INDEPENDENT_AMBULATORY_CARE_PROVIDER_SITE_OTHER): Payer: Self-pay | Admitting: Internal Medicine

## 2017-09-21 NOTE — Patient Instructions (Signed)
Justin LynchJames T Estrada  09/21/2017     @[email or web address] @   Your procedure is scheduled on  09/30/2017   [facility] to [facility] at  615   A.M.  Call this number if you have problems the morning of surgery:  [phone]   Remember:  Do not eat or drink after midnight.  You may drink clear liquids until  12 midnight 09/29/2017 .  Clear liquids allowed are:                    Water, Juice (non-citric and without pulp), Carbonated beverages, Clear Tea, Black Coffee only, Plain Jell-O only, Gatorade and Plain Popsicles only    Take these medicines the morning of surgery with A SIP OF WATER  None    Do not wear jewelry, make-up or nail polish.  Do not wear lotions, powders, or perfumes, or deodorant.  Do not shave 48 hours prior to surgery.  Men may shave face and neck.  Do not bring valuables to the hospital.  [facility] any belongings or valuables.  Contacts, dentures or bridgework may not be worn into surgery.  Leave your suitcase in the car.  After surgery it may be brought to your room.  For patients admitted to the hospital, discharge time will be determined by your treatment team.  Patients discharged the day of surgery will not be allowed to drive home.   Name and phone number of your driver:   family Special instructions:  None  Please read over the following fact sheets that you were given. Anesthesia Post-op Instructions and Care and Recovery After Surgery       Anal Fistulotomy Anal fistulotomy is a surgical procedure to open and drain an anal fistula. An anal fistula is an abnormal tunnel that develops between the bowel and the skin near the outside of the anus, where stool (feces) comes out. During this procedure, the fistula is opened up and the contents are drained to promote healing. Tell a health care provider about:  Any allergies you have.  All medicines you are taking, including vitamins, herbs, eye drops, creams, and  over-the-counter medicines.  Any problems you or family members have had with anesthetic medicines.  Any blood disorders you have.  Any surgeries you have had.  Any medical conditions you have.  Whether you are pregnant or may be pregnant. What are the risks? Generally, this is a safe procedure. However, problems may occur, including:  Infection.  Bleeding.  Allergic reactions to medicines or dyes.  Damage to other structures or organs.  Not being able to control when you have bowel movements (incontinence).  Being unable to empty your bladder (urinary retention).  What happens before the procedure? Staying hydrated Follow instructions from your health care provider about hydration, which may include:  Up to 2 hours before the procedure - you may continue to drink clear liquids, such as water, clear fruit juice, black coffee, and plain tea.  Eating and drinking restrictions Follow instructions from your health care provider about eating and drinking, which may include:  8 hours before the procedure - stop eating heavy meals or foods such as meat, fried foods, or fatty foods.  6 hours before the procedure - stop eating light meals or foods, such as toast or cereal.  6 hours before the procedure - stop drinking milk or drinks that contain milk.  2 hours before  the procedure - stop drinking clear liquids.  Medicines  Ask your health care provider about: ? Changing or stopping your regular medicines. This is especially important if you are taking diabetes medicines or blood thinners. ? Taking medicines such as aspirin and ibuprofen. These medicines can thin your blood. Do not take these medicines before your procedure if your health care provider instructs you not to.  You may be given antibiotic medicine to help prevent infection. General Instructions  You may have an exam or testing.  You may have a blood or urine sample taken.  You may be instructed to take a  laxative or use an enema to clean your bowels before surgery.  Plan to have someone take you home from the hospital or clinic.  If you will be going home right after the procedure, plan to have someone with you for 24 hours. What happens during the procedure?  To reduce your risk of infection: ? Your health care team will wash or sanitize their hands. ? Your skin will be washed with soap.  An IV tube will be inserted into one of your veins to give you fluids and medicines.  You will be given one or more of the following: ? A medicine to help you relax (sedative). ? A medicine to numb the area (local anesthetic). ? A medicine to make you fall asleep (general anesthetic).  Your surgeon will locate the internal opening of your fistula.  An incision will be made in the fistula opening. The incision may extend into the muscles around the anus (sphincter muscles).  The fistula will be cut open and drained.  The sides of the fistula may be stitched (sutured) to the sides of the incision.  Gauze bandages (dressings) may be placed inside of the fistula. The procedure may vary among health care providers and hospitals. What happens after the procedure?  Your blood pressure, heart rate, breathing rate, and blood oxygen level will be monitored until the medicines you were given have worn off.  You may have to wear compression stockings. These stockings help to prevent blood clots and reduce swelling in your legs.  Do not drive for 24 hours if you received a sedative.  You may continue to receive fluids and medicines through an IV tube.  You may have some bleeding from the surgical area. You may have to wear an absorbent pad. This information is not intended to replace advice given to you by your health care provider. Make sure you discuss any questions you have with your health care provider. Document Released: 06/05/2015 Document Revised: 09/20/2015 Document Reviewed:  06/05/2015 Elsevier Interactive Patient Education  2018 [employer].  Merchandiser, retail, Care After This sheet gives you information about how to care for yourself after your procedure. Your health care provider may also give you more specific instructions. If you have problems or questions, contact your health care provider. What can I expect after the procedure? After the procedure, it is common to have:  Some pain, discomfort, and swelling.  Increased pain during bowel movements.  Some bleeding from the wound.  Follow these instructions at home: Medicines  Take over-the-counter and prescription medicines only as told by your health care provider.  If you were prescribed an antibiotic medicine, take it as told by your health care provider. Do not stop taking the antibiotic even if you start to feel better. Activity  Do not drive for 24 hours if you received a medicine to help you relax (sedative)  during your procedure.  Do not drive or use heavy machinery while taking prescription pain medicine.  Do not lift anything that is heavier than the limit that your health care provider tells you until he or she says that it is safe.  Rest as told by your health care provider. It is also important to move around in between periods of rest. Get up and move around as often as you can tolerate.  Return to your normal activities as told by your health care provider. Ask your health care provider what activities are safe for you. Lifestyle  Limit alcohol intake to no more than 1 drink a day for nonpregnant women and 2 drinks a day for men. One drink equals 12 oz of beer, 5 oz of wine, or 1 oz of hard liquor.  Do not use any products that contain nicotine or tobacco, such as cigarettes and e-cigarettes. If you need help quitting, ask your health care provider. Incision care   Follow instructions from your health care provider about how to take care of your incision. ? If gauze (dressing)  was placed in your incision during surgery, you may be told to remove it yourself after a certain period of time. Make sure you wash your hands with soap and water before and after removing your dressing. If soap and water are not available, use hand sanitizer. ? Do not remove your dressing unless told by your health care provider. You may be told to allow the dressing to come out with your first bowel movement after surgery, instead of removing the dressing. ? Leave stitches (sutures), skin glue, or adhesive strips in place. These skin closures may need to stay in place for 2 weeks or longer. Do not remove adhesive strips completely unless your health care provider tells you to do that.  Keep the incision clean and dry.  Check your incision area every day for signs of infection. Check for: ? More redness, swelling, or pain. ? More fluid or blood. ? Warmth. ? Pus or a bad smell.  After having a bowel movement, clean the incision area using one of the following methods: ? Gently wipe with a moist towelette. ? Gently wipe with mild soap and water. ? Take a shower. ? Take a sitz bath. This is a warm water bath that is taken while you are sitting down.  You may apply ice to the incision area to reduce discomfort: ? Put ice in a plastic bag. ? Place a towel between your skin and the bag. ? Leave the ice on for 20 minutes, 2-3 times a day. Eating and drinking  Follow instructions from your health care provider about eating or drinking restrictions.  Drink enough fluid to keep your urine clear or pale yellow.  Eat lots of fiber. Fiber helps regulate bowel movements and prevent constipation. Foods that contain a lot of fiber include: ? Fruits. ? Vegetables. ? Whole grains. General instructions  Do not swim or use a hot tub until your health care provider says that this is safe.  If you have bleeding from your wound, wear an absorbent pad and change it often.  Wear compression stockings  as told by your health care provider. These stockings help to prevent blood clots and reduce swelling in your legs.  Keep all follow-up visits as told by your health care provider. This is important. Contact a health care provider if:  You have more redness, swelling, or pain around your incision area.  Your  incision area feels warm to the touch or firm.  You have pus or a bad smell coming from your incision area.  You have a fever or chills.  You develop swelling or tenderness in your groin area.  You cannot control when you have bowel movements.  You are leaking stool (incontinent).  You have trouble urinating.  You have pain that does not get better with medicine. Get help right away if:  You have severe pain in your: ? Wound area. ? Abdomen.  You have sudden chest pain.  You become weak or you pass out (faint).  You have more fluid or blood coming from your incision.  You have bleeding from your incision that soaks 2 or more pads during 24 hours. This information is not intended to replace advice given to you by your health care provider. Make sure you discuss any questions you have with your health care provider. Document Released: 06/05/2015 Document Revised: 09/20/2015 Document Reviewed: 06/05/2015 Elsevier Interactive Patient Education  2018 [employer].  Skin Tag, Adult A skin tag (acrochordon) is a soft, extra growth of skin. Most skin tags are flesh-colored and rarely bigger than a pencil eraser. They commonly form near areas where there are folds in the skin, such as the armpit or groin. Skin tags are not dangerous, and they do not spread from person to person (are not contagious). You may have one skin tag or several. Skin tags do not require treatment. However, your health care provider may recommend removal of a skin tag if it:  Gets irritated from clothing.  Bleeds.  Is visible and unsightly.  Your health care provider can remove skin tags with a  simple surgical procedure or a procedure that involves freezing the skin tag. Follow these instructions at home:  Watch for any changes in your skin tag. A normal skin tag does not require any other special care at home.  Take over-the-counter and prescription medicines only as told by your health care provider.  Keep all follow-up visits as told by your health care provider. This is important. Contact a health care provider if:  You have a skin tag that: ? Becomes painful. ? Changes color. ? Bleeds. ? Swells.  You develop more skin tags. This information is not intended to replace advice given to you by your health care provider. Make sure you discuss any questions you have with your health care provider. Document Released: 02/25/2015 Document Revised: 10/07/2015 Document Reviewed: 02/25/2015 Elsevier Interactive Patient Education  2018 [employer].  General Anesthesia, Adult General anesthesia is the use of medicines to make a person "go to sleep" (be unconscious) for a medical procedure. General anesthesia is often recommended when a procedure:  Is long.  Requires you to be still or in an unusual position.  Is major and can cause you to lose blood.  Is impossible to do without general anesthesia.  The medicines used for general anesthesia are called general anesthetics. In addition to making you sleep, the medicines:  Prevent pain.  Control your blood pressure.  Relax your muscles.  Tell a health care provider about:  Any allergies you have.  All medicines you are taking, including vitamins, herbs, eye drops, creams, and over-the-counter medicines.  Any problems you or family members have had with anesthetic medicines.  Types of anesthetics you have had in the past.  Any bleeding disorders you have.  Any surgeries you have had.  Any medical conditions you have.  Any history of heart or  lung conditions, such as heart failure, sleep apnea, or chronic  obstructive pulmonary disease (COPD).  Whether you are pregnant or may be pregnant.  Whether you use tobacco, alcohol, marijuana, or street drugs.  Any history of Financial planner.  Any history of depression or anxiety. What are the risks? Generally, this is a safe procedure. However, problems may occur, including:  Allergic reaction to anesthetics.  Lung and heart problems.  Inhaling food or liquids from your stomach into your lungs (aspiration).  Injury to nerves.  Waking up during your procedure and being unable to move (rare).  Extreme agitation or a state of mental confusion (delirium) when you wake up from the anesthetic.  Air in the bloodstream, which can lead to stroke.  These problems are more likely to develop if you are having a major surgery or if you have an advanced medical condition. You can prevent some of these complications by answering all of your health care provider's questions thoroughly and by following all pre-procedure instructions. General anesthesia can cause side effects, including:  Nausea or vomiting  A sore throat from the breathing tube.  Feeling cold or shivery.  Feeling tired, washed out, or achy.  Sleepiness or drowsiness.  Confusion or agitation.  What happens before the procedure? Staying hydrated Follow instructions from your health care provider about hydration, which may include:  Up to 2 hours before the procedure - you may continue to drink clear liquids, such as water, clear fruit juice, black coffee, and plain tea.  Eating and drinking restrictions Follow instructions from your health care provider about eating and drinking, which may include:  8 hours before the procedure - stop eating heavy meals or foods such as meat, fried foods, or fatty foods.  6 hours before the procedure - stop eating light meals or foods, such as toast or cereal.  6 hours before the procedure - stop drinking milk or drinks that contain  milk.  2 hours before the procedure - stop drinking clear liquids.  Medicines  Ask your health care provider about: ? Changing or stopping your regular medicines. This is especially important if you are taking diabetes medicines or blood thinners. ? Taking medicines such as aspirin and ibuprofen. These medicines can thin your blood. Do not take these medicines before your procedure if your health care provider instructs you not to. ? Taking new dietary supplements or medicines. Do not take these during the week before your procedure unless your health care provider approves them.  If you are told to take a medicine or to continue taking a medicine on the day of the procedure, take the medicine with sips of water. General instructions   Ask if you will be going home the same day, the following day, or after a longer hospital stay. ? Plan to have someone take you home. ? Plan to have someone stay with you for the first 24 hours after you leave the hospital or clinic.  For 3-6 weeks before the procedure, try not to use any tobacco products, such as cigarettes, chewing tobacco, and e-cigarettes.  You may brush your teeth on the morning of the procedure, but make sure to spit out the toothpaste. What happens during the procedure?  You will be given anesthetics through a mask and through an IV tube in one of your veins.  You may receive medicine to help you relax (sedative).  As soon as you are asleep, a breathing tube may be used to help you breathe.  An anesthesia specialist will stay with you throughout the procedure. He or she will help keep you comfortable and safe by continuing to give you medicines and adjusting the amount of medicine that you get. He or she will also watch your blood pressure, pulse, and oxygen levels to make sure that the anesthetics do not cause any problems.  If a breathing tube was used to help you breathe, it will be removed before you wake up. The procedure  may vary among health care providers and hospitals. What happens after the procedure?  You will wake up, often slowly, after the procedure is complete, usually in a recovery area.  Your blood pressure, heart rate, breathing rate, and blood oxygen level will be monitored until the medicines you were given have worn off.  You may be given medicine to help you calm down if you feel anxious or agitated.  If you will be going home the same day, your health care provider may check to make sure you can stand, drink, and urinate.  Your health care providers will treat your pain and side effects before you go home.  Do not drive for 24 hours if you received a sedative.  You may: ? Feel nauseous and vomit. ? Have a sore throat. ? Have mental slowness. ? Feel cold or shivery. ? Feel sleepy. ? Feel tired. ? Feel sore or achy, even in parts of your body where you did not have surgery. This information is not intended to replace advice given to you by your health care provider. Make sure you discuss any questions you have with your health care provider. Document Released: 05/20/2007 Document Revised: 07/24/2015 Document Reviewed: 01/25/2015 Elsevier Interactive Patient Education  2018 [employer]. General Anesthesia, Adult, Care After These instructions provide you with information about caring for yourself after your procedure. Your health care provider may also give you more specific instructions. Your treatment has been planned according to current medical practices, but problems sometimes occur. Call your health care provider if you have any problems or questions after your procedure. What can I expect after the procedure? After the procedure, it is common to have:  Vomiting.  A sore throat.  Mental slowness.  It is common to feel:  Nauseous.  Cold or shivery.  Sleepy.  Tired.  Sore or achy, even in parts of your body where you did not have surgery.  Follow these instructions  at home: For at least 24 hours after the procedure:  Do not: ? Participate in activities where you could fall or become injured. ? Drive. ? Use heavy machinery. ? Drink alcohol. ? Take sleeping pills or medicines that cause drowsiness. ? Make important decisions or sign legal documents. ? Take care of children on your own.  Rest. Eating and drinking  If you vomit, drink water, juice, or soup when you can drink without vomiting.  Drink enough fluid to keep your urine clear or pale yellow.  Make sure you have little or no nausea before eating solid foods.  Follow the diet recommended by your health care provider. General instructions  Have a responsible adult stay with you until you are awake and alert.  Return to your normal activities as told by your health care provider. Ask your health care provider what activities are safe for you.  Take over-the-counter and prescription medicines only as told by your health care provider.  If you smoke, do not smoke without supervision.  Keep all follow-up visits as told by your  health care provider. This is important. Contact a health care provider if:  You continue to have nausea or vomiting at home, and medicines are not helpful.  You cannot drink fluids or start eating again.  You cannot urinate after 8-12 hours.  You develop a skin rash.  You have fever.  You have increasing redness at the site of your procedure. Get help right away if:  You have difficulty breathing.  You have chest pain.  You have unexpected bleeding.  You feel that you are having a life-threatening or urgent problem. This information is not intended to replace advice given to you by your health care provider. Make sure you discuss any questions you have with your health care provider. Document Released: 05/19/2000 Document Revised: 07/16/2015 Document Reviewed: 01/25/2015 Elsevier Interactive Patient Education  [employer].

## 2017-09-23 ENCOUNTER — Encounter (HOSPITAL_COMMUNITY): Payer: Self-pay

## 2017-09-23 ENCOUNTER — Encounter: Payer: Self-pay | Admitting: Cardiovascular Disease

## 2017-09-23 ENCOUNTER — Other Ambulatory Visit: Payer: Self-pay

## 2017-09-23 ENCOUNTER — Ambulatory Visit (INDEPENDENT_AMBULATORY_CARE_PROVIDER_SITE_OTHER): Payer: BC Managed Care – PPO | Admitting: Cardiovascular Disease

## 2017-09-23 ENCOUNTER — Encounter (HOSPITAL_COMMUNITY)
Admission: RE | Admit: 2017-09-23 | Discharge: 2017-09-23 | Disposition: A | Discharge status: Home / Self Care | Payer: BC Managed Care – PPO | Source: Ambulatory Visit | Attending: General Surgery | Admitting: General Surgery

## 2017-09-23 VITALS — BP 124/72 | HR 66 | Ht 71.0 in | Wt 242.8 lb

## 2017-09-23 DIAGNOSIS — K219 Gastro-esophageal reflux disease without esophagitis: Secondary | ICD-10-CM

## 2017-09-23 DIAGNOSIS — Z01812 Encounter for preprocedural laboratory examination: Secondary | ICD-10-CM | POA: Insufficient documentation

## 2017-09-23 DIAGNOSIS — R0789 Other chest pain: Secondary | ICD-10-CM | POA: Diagnosis not present

## 2017-09-23 LAB — BASIC METABOLIC PANEL
Anion gap: 5 (ref 5–15)
BUN: 13 mg/dL (ref 6–20)
CALCIUM: 9 mg/dL (ref 8.9–10.3)
CO2: 28 mmol/L (ref 22–32)
CREATININE: 1.03 mg/dL (ref 0.61–1.24)
Chloride: 109 mmol/L (ref 98–111)
GFR calc non Af Amer: >60 mL/min (ref >60)
Glucose, Bld: 98 mg/dL (ref 70–99)
Potassium: 4.1 mmol/L (ref 3.5–5.1)
SODIUM: 142 mmol/L (ref 135–145)

## 2017-09-23 LAB — CBC WITH DIFFERENTIAL/PLATELET
BASOS PCT: 1 %
Basophils Absolute: 0.1 10*3/uL (ref 0.0–0.1)
EOS ABS: 0.1 10*3/uL (ref 0.0–0.7)
EOS PCT: 2 %
HEMATOCRIT: 44.6 % (ref 39.0–52.0)
Hemoglobin: 15.3 g/dL (ref 13.0–17.0)
Lymphocytes Relative: 27 %
Lymphs Abs: 1.5 10*3/uL (ref 0.7–4.0)
MCH: 30.8 pg (ref 26.0–34.0)
MCHC: 34.3 g/dL (ref 30.0–36.0)
MCV: 89.7 fL (ref 78.0–100.0)
MONO ABS: 0.4 10*3/uL (ref 0.1–1.0)
MONOS PCT: 7 %
Neutro Abs: 3.6 10*3/uL (ref 1.7–7.7)
Neutrophils Relative %: 63 %
PLATELETS: 250 10*3/uL (ref 150–400)
RBC: 4.97 MIL/uL (ref 4.22–5.81)
RDW: 13 % (ref 11.5–15.5)
WBC: 5.7 10*3/uL (ref 4.0–10.5)

## 2017-09-23 NOTE — Patient Instructions (Signed)
Medication Instructions:   Your physician recommends that you continue on your current medications as directed. Please refer to the Current Medication list given to you today.  Labwork:  NONE  Testing/Procedures:  NONE  Follow-Up:  Your physician recommends that you schedule a follow-up appointment in: as needed.   Any Other Special Instructions Will Be Listed Below (If Applicable).  If you need a refill on your cardiac medications before your next appointment, please call your pharmacy. 

## 2017-09-23 NOTE — Progress Notes (Signed)
SUBJECTIVE: The patient returns for follow-up after undergoing cardiovascular testing performed for the evaluation of chest pain.  He underwent a low risk stress echocardiogram on 09/08/2017.  There was no evidence for stress-induced ischemia.  [facility] treadmill score was 6.  Since I saw him last time his symptoms have considerably improved.  He denies chest pain, palpitations, shortness of breath.  He had been under a lot of stress as both he and his brother were going through their parents belongings.  He recently returned from a trip to the beach and is feeling much better.   Social history: Both of his parents were my patients.  They are deceased.  His older brother Virl Diamond used to come to those appointments with his parents.  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  Review of Systems: As per "subjective", otherwise negative.  No Known Allergies  Current Outpatient Medications  Medication Sig Dispense Refill  . diclofenac (VOLTAREN) 75 MG EC tablet Take 75 mg by mouth daily as needed (for inflammation/pain.).    Marland Kitchen diltiazem 2 % GEL Apply 1 application topically at bedtime as needed (anal fissure). Use as directed. 30 g 1  . docusate sodium (COLACE) 100 MG capsule Take 1 capsule (100 mg total) by mouth at bedtime.  0  . psyllium (METAMUCIL SMOOTH TEXTURE) 28 % packet Take 1 packet by mouth at bedtime.     No current facility-administered medications for this visit.     Past Medical History:  Diagnosis Date  . Medical history non-contributory     Past Surgical History:  Procedure Laterality Date  . COLONOSCOPY     10 plus years ago by Dr.Rourk  . COLONOSCOPY N/A 07/28/2016   Procedure: COLONOSCOPY;  Surgeon: Malissa Hippo, MD;  Location: [facility];  Service: Endoscopy;  Laterality: N/A;  730-rescheduled to 6/4 same time per Dewayne Hatch  . foot surgery  for bone spurs Bilateral   . UPPER GASTROINTESTINAL  ENDOSCOPY     10 plus years ago by Dr.Rourk    Social History   Socioeconomic History  . Marital status: Married    Spouse name: Not on file  . Number of children: Not on file  . Years of education: Not on file  . Highest education level: Not on file  Occupational History  . Not on file  Social Needs  . Financial resource strain: Not on file  . Food insecurity:    Worry: Not on file    Inability: Not on file  . Transportation needs:    Medical: Not on file    Non-medical: Not on file  Tobacco Use  . Smoking status: Never Smoker  . Smokeless tobacco: Never Used  Substance and Sexual Activity  . Alcohol use: No    Alcohol/week: 0.0 oz  . Drug use: No  . Sexual activity: Yes    Birth control/protection: None  Lifestyle  . Physical activity:    Days per week: Not on file    Minutes per session: Not on file  . Stress: Not on file  Relationships  . Social connections:    Talks on phone: Not on file    Gets together: Not on file    Attends religious service: Not on file    Active member of club or organization: Not on file    Attends meetings of clubs or organizations: Not on file    Relationship status: Not on  file  . Intimate partner violence:    Fear of current or ex partner: Not on file    Emotionally abused: Not on file    Physically abused: Not on file    Forced sexual activity: Not on file  Other Topics Concern  . Not on file  Social History Narrative  . Not on file     Vitals:   09/23/17 1050  BP: 124/72  Pulse: 66  SpO2: 94%  Weight: 242 lb 12.8 oz (110.1 kg)  Height: 5\' 11"  (1.803 m)    Wt Readings from Last 3 Encounters:  09/23/17 242 lb 12.8 oz (110.1 kg)  09/23/17 241 lb (109.3 kg)  08/31/17 236 lb (107 kg)     PHYSICAL EXAM General: NAD HEENT: Normal. Neck: No JVD, no thyromegaly. Lungs: Clear to auscultation bilaterally with normal respiratory effort. CV: Regular rate and rhythm, normal S1/S2, no S3/S4, no murmur. No pretibial or  periankle edema.     Abdomen: Soft, nontender, no distention.  Neurologic: Alert and oriented.  Psych: Normal affect. Skin: Normal. Musculoskeletal: No gross deformities.    ECG: Reviewed above under Subjective   Labs: Lab Results  Component Value Date/Time   K 4.1 09/23/2017 08:26 AM   BUN 13 09/23/2017 08:26 AM   BUN 13 04/18/2017 08:57 AM   CREATININE 1.03 09/23/2017 08:26 AM   ALT 21 04/18/2017 08:57 AM   HGB 15.3 09/23/2017 08:26 AM   HGB 15.7 07/23/2016 04:56 PM     Lipids: Lab Results  Component Value Date/Time   LDLCALC 86 04/18/2017 08:57 AM   CHOL 140 04/18/2017 08:57 AM   TRIG 94 04/18/2017 08:57 AM   HDL 35 (L) 04/18/2017 08:57 AM       ASSESSMENT AND PLAN: 1.  Chest discomfort: Symptomatically improved.  He does have associated belching which points to a gastrointestinal etiology.  Stress echocardiogram was negative for inducible ischemia.  No further cardiac testing is indicated.  2.  GERD: He cannot identify any specific foods which trigger acid reflux disease.  Is currently on Dexilant 60 mg daily.  He is due to follow-up with GI next week.      Disposition: Follow up as needed   Prentice DockerSuresh Koneswaran, M.D., F.A.C.C.

## 2017-09-29 ENCOUNTER — Encounter (INDEPENDENT_AMBULATORY_CARE_PROVIDER_SITE_OTHER): Payer: Self-pay | Admitting: Internal Medicine

## 2017-09-29 ENCOUNTER — Other Ambulatory Visit (INDEPENDENT_AMBULATORY_CARE_PROVIDER_SITE_OTHER): Payer: Self-pay | Admitting: *Deleted

## 2017-09-29 ENCOUNTER — Encounter (INDEPENDENT_AMBULATORY_CARE_PROVIDER_SITE_OTHER): Payer: Self-pay | Admitting: *Deleted

## 2017-09-29 ENCOUNTER — Ambulatory Visit (INDEPENDENT_AMBULATORY_CARE_PROVIDER_SITE_OTHER): Payer: BC Managed Care – PPO | Admitting: Internal Medicine

## 2017-09-29 VITALS — BP 130/78 | HR 64 | Temp 96.8°F | Resp 18 | Ht 71.0 in | Wt 238.8 lb

## 2017-09-29 DIAGNOSIS — G8929 Other chronic pain: Secondary | ICD-10-CM

## 2017-09-29 DIAGNOSIS — Z8719 Personal history of other diseases of the digestive system: Secondary | ICD-10-CM | POA: Diagnosis not present

## 2017-09-29 DIAGNOSIS — R0789 Other chest pain: Secondary | ICD-10-CM

## 2017-09-29 DIAGNOSIS — R1013 Epigastric pain: Secondary | ICD-10-CM | POA: Insufficient documentation

## 2017-09-29 DIAGNOSIS — K602 Anal fissure, unspecified: Secondary | ICD-10-CM

## 2017-09-29 MED ORDER — DILTIAZEM GEL 2 %: 1.0000 "application " | Freq: Every evening | CUTANEOUS | 1 refills | Status: DC | PRN

## 2017-09-29 NOTE — Patient Instructions (Signed)
Esophagogastroduodenoscopy to be scheduled. Keep symptom diary as discussed until the time of EGD.

## 2017-09-29 NOTE — Progress Notes (Signed)
Presenting complaint;  Noncardiac chest pain  Database and subjective:  Patient is 52 year old Caucasian male who is here for scheduled visit.  He was last seen on 05/19/2017.  He has a history of anal fissure.  He was also noted to have anal skin tag.  Because of ongoing symptoms he was referred to Dr. Henreitta Leber.  In the meantime he has been using diltiazem gel which has helped.  He needs a new prescription.  He is using it on intermittent basis.  He is scheduled to undergo exam under anesthesia tomorrow by Dr. Henreitta Leber.  He continues to have intermittent anal discomfort and needs a refill on diltiazem gel.  Today he has a new complaint of intermittent lower sternal pain.  This pain started in May this year while he was at work.  He was seen by Dr. Lilyan Punt.  His EKG and lab studies are normal.  He was begun on pantoprazole but he could not tell any difference.  He was subsequently evaluated by Dr. Purvis Sheffield and stress echo was unremarkable.  He was felt to have noncardiac chest pain.  He is still having Intermittent retrosternal pain.  It generally occurs after meals.  He says previously would he would have the symptom if he would overeat but now he does not do that.  At times he also feels discomfort in epigastric region.  He had abdominal ultrasound on 06/19/2017 and was negative for gallstones but revealed gallbladder polyps.  He is scheduled for follow-up study in 2 months.  He also reports nausea on 2-3 occasions.  He felt as if he had stomach virus.  He did not throw up.  He rarely has heartburn.  There is no history of peptic ulcer disease.  He denies nocturnal regurgitation or dysphagia.  He is walking regularly.  He is doing his best to lose weight.  His weight is down by 3 pounds. He has not taken diclofenac in several weeks.  He took it for for shoulder pain while back.   Current Medications: Outpatient Encounter Medications as of 09/29/2017  Medication Sig  . diclofenac (VOLTAREN) 75 MG  EC tablet Take 75 mg by mouth daily as needed (for inflammation/pain.).  Marland Kitchen diltiazem 2 % GEL Apply 1 application topically at bedtime as needed (anal fissure). Use as directed.  . docusate sodium (COLACE) 100 MG capsule Take 1 capsule (100 mg total) by mouth at bedtime.  . psyllium (METAMUCIL SMOOTH TEXTURE) 28 % packet Take 1 packet by mouth at bedtime.   No facility-administered encounter medications on file as of 09/29/2017.      Objective: Blood pressure 130/78, pulse 64, temperature (!) 96.8 F (36 C), temperature source Oral, resp. rate 18, height 5\' 11"  (1.803 m), weight 238 lb 12.8 oz (108.3 kg). Patient is alert and in no acute distress. Conjunctiva is pink. Sclera is nonicteric Oropharyngeal mucosa is normal. No neck masses or thyromegaly noted. Cardiac exam with regular rhythm normal S1 and S2. No murmur or gallop noted. Lungs are clear to auscultation. Abdomen is full but soft and nontender without organomegaly or masses. No LE edema or clubbing noted.  Labs/studies Results:  Abdominal ultrasound on 06/19/2017 Study negative for gallstones but revealed multiple small polyps the largest one measured 7 mm.  Bile duct 3 mm.  No abnormality noted to liver.  Assessment:  #1.  Noncardiac chest pain.  He did not respond to PPI.  His symptoms are not typical of GERD.  Symptoms may be due to biliary tract  disease but ultrasound negative for gallstones.  He does have small polyps and will need follow-up ultrasound in 2 months or 6 months from his last exam.  Since his symptoms are ongoing it would be reasonable to proceed with EGD.  #2.  History of anal fissure.  He is still having intermittent symptoms and using diltiazem gel.  He is scheduled to undergo exam under anesthesia by Dr. Henreitta LeberBridges on 09/30/2017.   Plan:  Diagnostic esophagogastroduodenoscopy to be scheduled.  Patient will keep symptom diary Until the time of his EGD. Diltiazem gel prescription refilled. Office visit in 1  year.

## 2017-09-30 ENCOUNTER — Ambulatory Visit (HOSPITAL_COMMUNITY)
Admission: RE | Admit: 2017-09-30 | Discharge: 2017-09-30 | Disposition: A | Discharge status: Home / Self Care | Payer: BC Managed Care – PPO | Source: Ambulatory Visit | Attending: General Surgery | Admitting: General Surgery

## 2017-09-30 ENCOUNTER — Ambulatory Visit (HOSPITAL_COMMUNITY): Payer: BC Managed Care – PPO | Admitting: Anesthesiology

## 2017-09-30 ENCOUNTER — Encounter (HOSPITAL_COMMUNITY): Payer: Self-pay | Admitting: Anesthesiology

## 2017-09-30 ENCOUNTER — Encounter (HOSPITAL_COMMUNITY)
Admission: RE | Disposition: A | Discharge status: Home / Self Care | Payer: Self-pay | Source: Ambulatory Visit | Attending: General Surgery

## 2017-09-30 DIAGNOSIS — Z8719 Personal history of other diseases of the digestive system: Secondary | ICD-10-CM | POA: Diagnosis not present

## 2017-09-30 DIAGNOSIS — K644 Residual hemorrhoidal skin tags: Secondary | ICD-10-CM

## 2017-09-30 DIAGNOSIS — K602 Anal fissure, unspecified: Secondary | ICD-10-CM | POA: Diagnosis not present

## 2017-09-30 DIAGNOSIS — L918 Other hypertrophic disorders of the skin: Secondary | ICD-10-CM | POA: Diagnosis not present

## 2017-09-30 HISTORY — PX: EXCISION OF SKIN TAG: SHX6270

## 2017-09-30 SURGERY — EXAM UNDER ANESTHESIA
Anesthesia: General

## 2017-09-30 MED ORDER — OXYCODONE HCL 5 MG PO TABS: 5.0000 mg | ORAL_TABLET | ORAL | 0 refills | Status: DC | PRN

## 2017-09-30 MED ORDER — BUPIVACAINE LIPOSOME 1.3 % IJ SUSP
INTRAMUSCULAR | Status: AC
  Filled 2017-09-30: qty 20

## 2017-09-30 MED ORDER — FENTANYL CITRATE (PF) 100 MCG/2ML IJ SOLN: 25.0000 ug | INTRAMUSCULAR | Status: DC | PRN

## 2017-09-30 MED ORDER — CHLORHEXIDINE GLUCONATE CLOTH 2 % EX PADS: 6.0000 | MEDICATED_PAD | Freq: Once | CUTANEOUS | Status: DC

## 2017-09-30 MED ORDER — MIDAZOLAM HCL 2 MG/2ML IJ SOLN
INTRAMUSCULAR | Status: AC
  Filled 2017-09-30: qty 2

## 2017-09-30 MED ORDER — SODIUM CHLORIDE 0.9 % IR SOLN
Status: DC | PRN
  Administered 2017-09-30: 1000 mL

## 2017-09-30 MED ORDER — LACTATED RINGERS IV SOLN
INTRAVENOUS | Status: DC
  Administered 2017-09-30: 1000 mL via INTRAVENOUS

## 2017-09-30 MED ORDER — SODIUM CHLORIDE 0.9 % IV SOLN
2.0000 g | INTRAVENOUS | Status: AC
  Administered 2017-09-30: 2 g via INTRAVENOUS
  Filled 2017-09-30: qty 2

## 2017-09-30 MED ORDER — ONDANSETRON HCL 4 MG/2ML IJ SOLN
INTRAMUSCULAR | Status: DC | PRN
  Administered 2017-09-30: 4 mg via INTRAVENOUS

## 2017-09-30 MED ORDER — MIDAZOLAM HCL 5 MG/5ML IJ SOLN
INTRAMUSCULAR | Status: DC | PRN
  Administered 2017-09-30: 2 mg via INTRAVENOUS

## 2017-09-30 MED ORDER — FLEET ENEMA 7-19 GM/118ML RE ENEM
1.0000 | ENEMA | Freq: Once | RECTAL | Status: AC
  Administered 2017-09-30: 1 via RECTAL
  Filled 2017-09-30: qty 1

## 2017-09-30 MED ORDER — BUPIVACAINE LIPOSOME 1.3 % IJ SUSP
INTRAMUSCULAR | Status: DC | PRN
  Administered 2017-09-30: 20 mL

## 2017-09-30 MED ORDER — PROPOFOL 10 MG/ML IV BOLUS
INTRAVENOUS | Status: AC
  Filled 2017-09-30: qty 40

## 2017-09-30 MED ORDER — FENTANYL CITRATE (PF) 250 MCG/5ML IJ SOLN
INTRAMUSCULAR | Status: AC
  Filled 2017-09-30: qty 5

## 2017-09-30 MED ORDER — LIDOCAINE HCL (CARDIAC) PF 50 MG/5ML IV SOSY
PREFILLED_SYRINGE | INTRAVENOUS | Status: DC | PRN
  Administered 2017-09-30: 40 mg via INTRAVENOUS
  Administered 2017-09-30: 200 mg via INTRAVENOUS
  Administered 2017-09-30: 50 mg via INTRAVENOUS

## 2017-09-30 MED ORDER — ONDANSETRON HCL 4 MG/2ML IJ SOLN
INTRAMUSCULAR | Status: AC
  Filled 2017-09-30: qty 2

## 2017-09-30 MED ORDER — FENTANYL CITRATE (PF) 100 MCG/2ML IJ SOLN
INTRAMUSCULAR | Status: DC | PRN
  Administered 2017-09-30: 50 ug via INTRAVENOUS
  Administered 2017-09-30 (×2): 25 ug via INTRAVENOUS

## 2017-09-30 MED ORDER — DOCUSATE SODIUM 100 MG PO CAPS: 100.0000 mg | ORAL_CAPSULE | Freq: Two times a day (BID) | ORAL | 0 refills | Status: AC

## 2017-09-30 MED ORDER — LIDOCAINE VISCOUS HCL 2 % MT SOLN
OROMUCOSAL | Status: AC
  Filled 2017-09-30: qty 15

## 2017-09-30 MED ORDER — SUCCINYLCHOLINE CHLORIDE 20 MG/ML IJ SOLN
INTRAMUSCULAR | Status: AC
  Filled 2017-09-30: qty 2

## 2017-09-30 MED ORDER — HYDROCODONE-ACETAMINOPHEN 7.5-325 MG PO TABS: 1.0000 | ORAL_TABLET | Freq: Once | ORAL | Status: DC | PRN

## 2017-09-30 MED ORDER — LIDOCAINE VISCOUS HCL 2 % MT SOLN
OROMUCOSAL | Status: DC | PRN
  Administered 2017-09-30: 1 via OROMUCOSAL

## 2017-09-30 SURGICAL SUPPLY — 33 items
CLOTH BEACON ORANGE TIMEOUT ST (SAFETY) ×3 IMPLANT
COVER LIGHT HANDLE STERIS (MISCELLANEOUS) ×6 IMPLANT
DRAPE HALF SHEET 40X57 (DRAPES) ×3 IMPLANT
DRAPE PROXIMA HALF (DRAPES) ×3 IMPLANT
ELECT REM PT RETURN 9FT ADLT (ELECTROSURGICAL) ×3
ELECTRODE REM PT RTRN 9FT ADLT (ELECTROSURGICAL) ×1 IMPLANT
GAUZE SPONGE 4X4 12PLY STRL (GAUZE/BANDAGES/DRESSINGS) ×6 IMPLANT
GLOVE BIO SURGEON STRL SZ 6.5 (GLOVE) ×2 IMPLANT
GLOVE BIO SURGEONS STRL SZ 6.5 (GLOVE) ×1
GLOVE BIOGEL M 6.5 STRL (GLOVE) ×3 IMPLANT
GLOVE BIOGEL PI IND STRL 6.5 (GLOVE) ×2 IMPLANT
GLOVE BIOGEL PI IND STRL 7.0 (GLOVE) ×2 IMPLANT
GLOVE BIOGEL PI IND STRL 7.5 (GLOVE) ×1 IMPLANT
GLOVE BIOGEL PI INDICATOR 6.5 (GLOVE) ×4
GLOVE BIOGEL PI INDICATOR 7.0 (GLOVE) ×4
GLOVE BIOGEL PI INDICATOR 7.5 (GLOVE) ×2
GLOVE SURG SS PI 7.5 STRL IVOR (GLOVE) ×3 IMPLANT
GOWN STRL REUS W/ TWL XL LVL3 (GOWN DISPOSABLE) ×1 IMPLANT
GOWN STRL REUS W/TWL LRG LVL3 (GOWN DISPOSABLE) ×3 IMPLANT
GOWN STRL REUS W/TWL XL LVL3 (GOWN DISPOSABLE) ×3
HEMOSTAT SURGICEL 4X8 (HEMOSTASIS) ×3 IMPLANT
KIT TURNOVER CYSTO (KITS) ×3 IMPLANT
LIGASURE IMPACT 36 18CM CVD LR (INSTRUMENTS) ×3 IMPLANT
MANIFOLD NEPTUNE II (INSTRUMENTS) ×3 IMPLANT
NEEDLE HYPO 22GX1.5 SAFETY (NEEDLE) ×3 IMPLANT
NS IRRIG 1000ML POUR BTL (IV SOLUTION) ×3 IMPLANT
PACK PERI GYN (CUSTOM PROCEDURE TRAY) ×3 IMPLANT
PAD ARMBOARD 7.5X6 YLW CONV (MISCELLANEOUS) ×3 IMPLANT
SET BASIN LINEN APH (SET/KITS/TRAYS/PACK) ×3 IMPLANT
SURGILUBE 3G PEEL PACK STRL (MISCELLANEOUS) ×3 IMPLANT
SUT SILK 0 FSL (SUTURE) ×3 IMPLANT
SUT VIC AB 2-0 CT2 27 (SUTURE) ×3 IMPLANT
SYR 20CC LL (SYRINGE) ×3 IMPLANT

## 2017-09-30 NOTE — Discharge Instructions (Signed)
Shower per your regular routine. Take tylenol and ibuprofen as needed for pain control, alternating every 4-6 hours.  Take Roxicodone for breakthrough pain. Take colace for constipation related to narcotic pain medication. Please keep the area clean and dry and take Sitz baths (swallow warm water baths) for comfort and after bowel movements.  Please keep your stools soft and take fiber daily (metamucil) and/or colace to help prevent constipation. If you have not had a BM in 2 days, please take miralax and please notify Dr. Henreitta LeberBridges.  Expect some bleeding following the skin tag removal and pain.  Go to the ED with extensive bleeding, fevers, or chills.   You will have some fissure like pain after the exam under anesthesia. Examining the area caused some minor tear/ trauma to the posterior aspect of the anus where your fissure is located.  This looked like this was made during the exam and did not appear to be chronic.  Use your diltiazem as directed in the next few days if having spasm in the anal region and pain with BMs.    Skin Tag, Adult A skin tag (acrochordon) is a soft, extra growth of skin. Most skin tags are flesh-colored and rarely bigger than a pencil eraser. They commonly form near areas where there are folds in the skin, such as the armpit or groin. Skin tags are not dangerous, and they do not spread from person to person (are not contagious). You may have one skin tag or several. Skin tags do not require treatment. However, your health care provider may recommend removal of a skin tag if it:  Gets irritated from clothing.  Bleeds.  Is visible and unsightly.  Your health care provider can remove skin tags with a simple surgical procedure or a procedure that involves freezing the skin tag. Follow these instructions at home:  Watch for any changes in your skin tag. A normal skin tag does not require any other special care at home.  Take over-the-counter and prescription medicines  only as told by your health care provider.  Keep all follow-up visits as told by your health care provider. This is important. Contact a health care provider if:  You have a skin tag that: ? Becomes painful. ? Changes color. ? Bleeds. ? Swells.  You develop more skin tags. This information is not intended to replace advice given to you by your health care provider. Make sure you discuss any questions you have with your health care provider. Document Released: 02/25/2015 Document Revised: 10/07/2015 Document Reviewed: 02/25/2015 Elsevier Interactive Patient Education  [employer]2018 [employer]vier Inc[employer]

## 2017-09-30 NOTE — Anesthesia Preprocedure Evaluation (Signed)
Anesthesia Evaluation  Patient identified by MRN, date of birth, ID band Patient awake    Reviewed: Allergy & Precautions, NPO status , Patient's Chart, lab work & pertinent test results  Airway Mallampati: II  TM Distance: >3 FB Neck ROM: Full    Dental no notable dental hx. (+) Teeth Intact   Pulmonary neg pulmonary ROS,    Pulmonary exam normal breath sounds clear to auscultation       Cardiovascular Exercise Tolerance: Good negative cardio ROS Normal cardiovascular examI Rhythm:Regular Rate:Normal  S/p recent EST - reportedly - States can walk a mile    Neuro/Psych negative neurological ROS  negative psych ROS   GI/Hepatic negative GI ROS, Neg liver ROS,   Endo/Other  negative endocrine ROS  Renal/GU negative Renal ROS  negative genitourinary   Musculoskeletal negative musculoskeletal ROS (+)   Abdominal   Peds negative pediatric ROS (+)  Hematology negative hematology ROS (+)   Anesthesia Other Findings   Reproductive/Obstetrics negative OB ROS                             Anesthesia Physical Anesthesia Plan  ASA: II  Anesthesia Plan: General   Post-op Pain Management:    Induction: Intravenous  PONV Risk Score and Plan:   Airway Management Planned: Simple Face Mask  Additional Equipment:   Intra-op Plan:   Post-operative Plan: Extubation in OR  Informed Consent: I have reviewed the patients History and Physical, chart, labs and discussed the procedure including the risks, benefits and alternatives for the proposed anesthesia with the patient or authorized representative who has indicated his/her understanding and acceptance.   Dental advisory given  Plan Discussed with: CRNA  Anesthesia Plan Comments: (MAC vs GA )        Anesthesia Quick Evaluation

## 2017-09-30 NOTE — Interval H&P Note (Signed)
History and Physical Interval Note:  09/30/2017 7:19 AM  Justin Estrada  has presented today for surgery, with the diagnosis of perianal skin tags  The various methods of treatment have been discussed with the patient and family. After consideration of risks, benefits and other options for treatment, the patient has consented to  Procedure(s): EXAM UNDER ANESTHESIA ANORECTAL (N/A) EXCISION OF PERIANAL SKIN TAGS (N/A) as a surgical intervention .  The patient's history has been reviewed, patient examined, no change in status, stable for surgery.  I have reviewed the patient's chart and labs.  Questions were answered to the patient's satisfaction.    No changes. Plan to remove tag and to look at the area to ensure no additional pathology.   Lucretia RoersLindsay C Bridges

## 2017-09-30 NOTE — Anesthesia Postprocedure Evaluation (Signed)
Anesthesia Post Note  Patient: Justin LynchJames T Ravelo  Procedure(s) Performed: Francia GreavesEXAM UNDER ANESTHESIA ANORECTAL (N/A ) EXCISION OF PERIANAL SKIN TAGS (N/A )  Patient location during evaluation: PACU Anesthesia Type: General Level of consciousness: awake and alert Pain management: pain level controlled Vital Signs Assessment: post-procedure vital signs reviewed and stable Respiratory status: spontaneous breathing Cardiovascular status: stable Postop Assessment: no apparent nausea or vomiting Anesthetic complications: no     Last Vitals:  Vitals:   09/30/17 0820 09/30/17 0830  BP: 125/70 130/78  Pulse: 74 61  Resp: 10 12  Temp: 36.7 C   SpO2: 92% 96%    Last Pain:  Vitals:   09/30/17 0830  PainSc: 0-No pain                 ADAMS, AMY A

## 2017-09-30 NOTE — Transfer of Care (Signed)
Immediate Anesthesia Transfer of Care Note  Patient: Justin Estrada  Procedure(s) Performed: Francia GreavesEXAM UNDER ANESTHESIA ANORECTAL (N/A ) EXCISION OF PERIANAL SKIN TAGS (N/A )  Patient Location: PACU  Anesthesia Type:General  Level of Consciousness: awake, alert , oriented and patient cooperative  Airway & Oxygen Therapy: Patient Spontanous Breathing  Post-op Assessment: Report given to RN and Post -op Vital signs reviewed and stable  Post vital signs: Reviewed and stable  Last Vitals:  Vitals Value Taken Time  BP 125/70 09/30/2017  8:17 AM  Temp    Pulse 68 09/30/2017  8:18 AM  Resp 10 09/30/2017  8:18 AM  SpO2 92 % 09/30/2017  8:18 AM  Vitals shown include unvalidated device data.  Last Pain:  Vitals:   09/30/17 0700  PainSc: 0-No pain         Complications: No apparent anesthesia complications

## 2017-09-30 NOTE — Anesthesia Procedure Notes (Signed)
Procedure Name: LMA Insertion Date/Time: 09/30/2017 7:40 AM Performed by: Usama Harkless A, CRNA Pre-anesthesia Checklist: Patient identified, Timeout performed, Emergency Drugs available, Suction available and Patient being monitored Patient Re-evaluated:Patient Re-evaluated prior to induction Oxygen Delivery Method: Circle system utilized Preoxygenation: Pre-oxygenation with 100% oxygen Induction Type: IV induction LMA: LMA inserted LMA Size: 5.0 Number of attempts: 1 Placement Confirmation: positive ETCO2 and breath sounds checked- equal and bilateral Tube secured with: Tape Dental Injury: Teeth and Oropharynx as per pre-operative assessment

## 2017-09-30 NOTE — Op Note (Signed)
[facility] Operative Note  09/30/17  Preoperative Diagnosis:  Perianal skin tag, history of anal fissure    Postoperative Diagnosis: Perianal skin tag, posterior anal fissure    Procedure(s) Performed: Exam under anesthesia and excision of posterior perianal skin tag    Surgeon: Leatrice JewelsLindsay C. Henreitta LeberBridges, MD   Assistants: No qualified resident was available    Anesthesia: General endotracheal   Anesthesiologist: Dorena CookeyNabonsal, Jeff S, MD    Specimens: Skin tag    Estimated Blood Loss: Minimal   Blood Replacement: None    Complications: None   Wound Class: Dirty    Operative Indications: Justin Estrada is a 52 yo with a prior history of an anal fissure who has to use diltiazem cream intermittently but overall had little complaints regarding his fissure. He says that it acts up occasionally but his biggest complaint was the posterior anal skin tag he had developed overtime.  He was sent to me for evaluation and we discussed the risk and benefits of skin tag removal as well as the options for surgical intervention for the fissure where the sphincter is cut to allow for healing of the fissure.  We discussed this but due to his limited symptoms opted to perform an exam to investigate to insure no other pathology was developing and to remove the skin tag.   Findings: Posterior anal skin tag, acute tear of posterior anoderm from anoscopy    Procedure: The patient was taken to the operating room and placed supine. General endotracheal anesthesia was induced. Intravenous antibiotics were administered per protocol.  The perirectal and perineal regions were prepared and draped in the usual sterile fashion.  A JCAHO approved time out was performed.   A digital rectal exam was performed, demonstrating relatively normal tone under anesthesia.  A very minimal tight band on the edge of the sphincter complex was noted.  On anoscopy, an acute anoderm tear was found at the area of the prior  posterior anal fissure site, but this appeared to have been from the trauma of the rectal exam and anoscopy.   There was no evidence of any fistula or other abnormality.  There were small internal hemorrhoids.  The posterior anal skin tag was removed with cautery at the edge of the anoderm.    Hemostasis was achieved. The tag was sent to pathology.  A surgicel wrapped gauze with viscous lidocaine was placed in the anus and tagged for removal in PACU.   All counts were correct at the end of the case. The patient was awakened from anesthesia and extubate without complication.  The patient went to the PACU in stable condition.   Justin GreenhouseLindsay Mathieu Schloemer, MD [facility] [address] [place], [place] [id number] [phone] (office)

## 2017-09-30 NOTE — Progress Notes (Signed)
Fleets enema completed per pt. States results were small amt of brown formed stool. Stated he had a "good" bowel movement before coming to the hospital this AM.

## 2017-10-02 ENCOUNTER — Encounter (HOSPITAL_COMMUNITY): Payer: Self-pay | Admitting: General Surgery

## 2017-10-06 ENCOUNTER — Ambulatory Visit: Payer: BC Managed Care – PPO | Admitting: Orthopedic Surgery

## 2017-10-06 ENCOUNTER — Encounter: Payer: Self-pay | Admitting: Orthopedic Surgery

## 2017-10-06 VITALS — BP 146/92 | HR 71 | Ht 71.0 in | Wt 239.0 lb

## 2017-10-06 DIAGNOSIS — M66322 Spontaneous rupture of flexor tendons, left upper arm: Secondary | ICD-10-CM | POA: Diagnosis not present

## 2017-10-06 NOTE — Progress Notes (Signed)
Chief Complaint  Patient presents with  . Shoulder Pain    left, has pain across shoulder and burning upper arm denies neck pain    History this is a 52 year old male who tripped out of bed and tripped over an ottoman and then landed on his left shoulder work-up with shoulder hurting the next morning he saw us back in March so is had pain now for 5 months.  Pain is dull proximal left humerus and biceps associated with painful supination and flexion of the humerus and elbow he reinjured the shoulder moving some boxes cleaning out his parents house presents now with persistent pain despite NSAID and activity modification.  Review of systems neurologically he is intact with no numbness or tingling in the left upper extremity he does have decreased range of motion with decreased internal rotation left shoulder  BP (!) 146/92   Pulse 71   Ht 5\' 11"  (1.803 m)   Wt 239 lb (108.4 kg)   BMI 33.33 kg/m   Physical Exam  Constitutional: He is oriented to person, place, and time. He appears well-developed and well-nourished.  Musculoskeletal:  Left shoulder Tenderness in the left bicipital groove anterior shoulder joint line  No swelling  Range of motion normal flexion extension external rotation decreased internal rotation to the L5 region  Rotator cuff strength is normal  Speeds test is positive  Yergason test is negative  Impingement sign normal  Crank test normal  Stable in abduction external rotation negative sulcus sign.  Neurological: He is alert and oriented to person, place, and time.  Psychiatric: He has a normal mood and affect. His behavior is normal. Judgment and thought content normal.   Encounter Diagnosis  Name Primary?  Marland Kitchen. Nontraumatic rupture of left long head biceps tendon Yes    Recommend MRI to evaluate the biceps tendon and labrum for possible tearing

## 2017-10-07 ENCOUNTER — Encounter (INDEPENDENT_AMBULATORY_CARE_PROVIDER_SITE_OTHER): Payer: Self-pay

## 2017-10-08 ENCOUNTER — Encounter: Payer: Self-pay | Admitting: General Surgery

## 2017-10-08 ENCOUNTER — Ambulatory Visit (INDEPENDENT_AMBULATORY_CARE_PROVIDER_SITE_OTHER): Payer: Self-pay | Admitting: General Surgery

## 2017-10-08 ENCOUNTER — Ambulatory Visit: Payer: Self-pay | Admitting: General Surgery

## 2017-10-08 VITALS — BP 139/82 | HR 69 | Temp 97.5°F | Resp 8 | Wt 240.0 lb

## 2017-10-08 DIAGNOSIS — K644 Residual hemorrhoidal skin tags: Secondary | ICD-10-CM

## 2017-10-08 NOTE — Patient Instructions (Signed)
Activity as tolerated. Fissure cream as needed.

## 2017-10-08 NOTE — Progress Notes (Signed)
[facility] Note   HPI:  52 y.o. Male presents to clinic for post-op follow-up evaluation after a skin tag removal and eua to evaluate his sphincter given fissure history. Patient reports doing well and having minimal pain.  Review of Systems:  No pain with BMs Not having to use fissure cream No fevers or chills No drainage All other review of systems: otherwise negative   Pathology: Diagnosis Skin, tag, posterior anal - FIBROEPITHELIAL POLYP (SKIN TAG)  Vital Signs:  BP 139/82 (BP Location: Left Arm, Patient Position: Sitting, Cuff Size: Large)   Pulse 69   Temp (!) 97.5 F (36.4 C) (Temporal)   Resp (!) 8   Wt 240 lb (108.9 kg)   BMI 33.47 kg/m    Physical Exam:  Physical Exam  Pulmonary/Chest: Effort normal.  Genitourinary:  Genitourinary Comments: Perianal area with healing posterior skin, no erythema or drainage  Vitals reviewed.   Laboratory studies: None   Imaging:  None    Assessment:  52 y.o. yo Male with excision of perianal skin tag that was posterior and likely related to his history of fissure. We did an EUA and his sphincter was only mildly tight and the minor tear I created with the exam did not bother him post op. He is doing well. Discussed reasons for seeking fissure surgery in the future.   Plan:  - Follow up PRN   - Use Fissure cream as needed   All of the above recommendations were discussed with the patient, and all of patient's questions were answered to his expressed satisfaction.  Algis GreenhouseLindsay Deztinee Lohmeyer, MD [facility] [address] [place], [place] [phone] (office)

## 2017-10-12 ENCOUNTER — Other Ambulatory Visit: Payer: Self-pay

## 2017-10-12 ENCOUNTER — Encounter (HOSPITAL_COMMUNITY)
Admission: RE | Disposition: A | Discharge status: Home / Self Care | Payer: Self-pay | Source: Ambulatory Visit | Attending: Internal Medicine

## 2017-10-12 ENCOUNTER — Ambulatory Visit (HOSPITAL_COMMUNITY)
Admission: RE | Admit: 2017-10-12 | Discharge: 2017-10-12 | Disposition: A | Discharge status: Home / Self Care | Payer: BC Managed Care – PPO | Source: Ambulatory Visit | Attending: Internal Medicine | Admitting: Internal Medicine

## 2017-10-12 DIAGNOSIS — Z79899 Other long term (current) drug therapy: Secondary | ICD-10-CM | POA: Diagnosis not present

## 2017-10-12 DIAGNOSIS — R0789 Other chest pain: Secondary | ICD-10-CM | POA: Insufficient documentation

## 2017-10-12 DIAGNOSIS — R1013 Epigastric pain: Secondary | ICD-10-CM | POA: Diagnosis not present

## 2017-10-12 DIAGNOSIS — G8929 Other chronic pain: Secondary | ICD-10-CM

## 2017-10-12 DIAGNOSIS — K2950 Unspecified chronic gastritis without bleeding: Secondary | ICD-10-CM | POA: Diagnosis not present

## 2017-10-12 DIAGNOSIS — K3189 Other diseases of stomach and duodenum: Secondary | ICD-10-CM | POA: Diagnosis not present

## 2017-10-12 HISTORY — PX: BIOPSY: SHX5522

## 2017-10-12 HISTORY — PX: ESOPHAGOGASTRODUODENOSCOPY: SHX5428

## 2017-10-12 SURGERY — EGD (ESOPHAGOGASTRODUODENOSCOPY)
Anesthesia: Moderate Sedation

## 2017-10-12 MED ORDER — MEPERIDINE HCL 50 MG/ML IJ SOLN
INTRAMUSCULAR | Status: DC
Start: 2017-10-12 — End: 2017-10-12
  Filled 2017-10-12: qty 1

## 2017-10-12 MED ORDER — SODIUM CHLORIDE 0.9 % IV SOLN
INTRAVENOUS | Status: DC
  Administered 2017-10-12: 07:00:00 via INTRAVENOUS

## 2017-10-12 MED ORDER — LIDOCAINE VISCOUS HCL 2 % MT SOLN
OROMUCOSAL | Status: AC
  Filled 2017-10-12: qty 15

## 2017-10-12 MED ORDER — MIDAZOLAM HCL 5 MG/5ML IJ SOLN
INTRAMUSCULAR | Status: DC | PRN
  Administered 2017-10-12: 2 mg via INTRAVENOUS
  Administered 2017-10-12: 1 mg via INTRAVENOUS
  Administered 2017-10-12 (×2): 2 mg via INTRAVENOUS

## 2017-10-12 MED ORDER — MIDAZOLAM HCL 5 MG/5ML IJ SOLN
INTRAMUSCULAR | Status: AC
  Filled 2017-10-12: qty 10

## 2017-10-12 MED ORDER — LIDOCAINE VISCOUS HCL 2 % MT SOLN
OROMUCOSAL | Status: DC | PRN
  Administered 2017-10-12: 1 via OROMUCOSAL

## 2017-10-12 MED ORDER — MEPERIDINE HCL 50 MG/ML IJ SOLN
INTRAMUSCULAR | Status: DC | PRN
  Administered 2017-10-12 (×2): 25 mg via INTRAVENOUS

## 2017-10-12 NOTE — Op Note (Addendum)
[facility] Patient Name: Justin Estrada Procedure Date: 10/12/2017 7:24 AM MRN: [id number] Date of Birth: 07/09/1965 Attending MD: Lionel DecemberNajeeb Hyatt Capobianco , MD CSN: [id number] Age: [age] Admit Type: Outpatient Procedure:                Upper GI endoscopy Indications:              Epigastric abdominal pain, Chest pain (non cardiac) Providers:                Lionel DecemberNajeeb Ilyana Manuele, MD, Jannett CelestineAnitra Bell, RN, Edythe ClarityKelly Cox,                            Technician Referring MD:             Lilyan PuntScott Luking, MD Medicines:                Lidocaine spray, Meperidine 50 mg IV, Midazolam 7                            mg IV Complications:            No immediate complications. Estimated Blood Loss:     Estimated blood loss was minimal. Procedure:                Pre-Anesthesia Assessment:                           - Prior to the procedure, a History and Physical                            was performed, and patient medications and                            allergies were reviewed. The patient's tolerance of                            previous anesthesia was also reviewed. The risks                            and benefits of the procedure and the sedation                            options and risks were discussed with the patient.                            All questions were answered, and informed consent                            was obtained. Prior Anticoagulants: The patient has                            taken no previous anticoagulant or antiplatelet                            agents. ASA Grade Assessment: I - A normal, healthy  patient. After reviewing the risks and benefits,                            the patient was deemed in satisfactory condition to                            undergo the procedure.                           After obtaining informed consent, the endoscope was                            passed under direct vision. Throughout the                            procedure, the  patient's blood pressure, pulse, and                            oxygen saturations were monitored continuously. The                            GIF-H190 ([id number]) scope was introduced through the                            mouth, and advanced to the second part of duodenum.                            The upper GI endoscopy was accomplished without                            difficulty. The patient tolerated the procedure                            well. Scope In: 7:42:47 AM Scope Out: 7:50:18 AM Total Procedure Duration: 0 hours 7 minutes 31 seconds  Findings:      The examined esophagus was normal.      The Z-line was regular and was found 42 cm from the incisors.      A healed ulcer was found in the prepyloric region of the stomach.      Patchy mildly erythematous mucosa without bleeding was found in the       gastric antrum. Biopsies were taken with a cold forceps for histology.      The exam of the stomach was otherwise normal.      The duodenal bulb and second portion of the duodenum were normal. Impression:               - Normal esophagus.                           - Z-line regular, 42 cm from the incisors.                           - Scar in the prepyloric region of the stomach.                           -  Erythematous mucosa in the antrum. Biopsied.                           - Normal duodenal bulb and second portion of the                            duodenum. Moderate Sedation:      Moderate (conscious) sedation was administered by the endoscopy nurse       and supervised by the endoscopist. The following parameters were       monitored: oxygen saturation, heart rate, blood pressure, CO2       capnography and response to care. Recommendation:           - Patient has a contact number available for                            emergencies. The signs and symptoms of potential                            delayed complications were discussed with the                             patient. Return to normal activities tomorrow.                            Written discharge instructions were provided to the                            patient.                           - Resume previous diet today.                           - Continue present medications.                           - Await pathology results. Procedure Code(s):        --- Professional ---                           [phone], Esophagogastroduodenoscopy, flexible,                            transoral; with biopsy, single or multiple Diagnosis Code(s):        --- Professional ---                           K31.89, Other diseases of stomach and duodenum                           R10.13, Epigastric pain                           R07.89, Other chest pain CPT copyright 2017 American Medical Association. All rights reserved. The codes documented in this report are preliminary and upon coder review may  be  revised to meet current compliance requirements. Lionel December, MD Lionel December, MD 10/12/2017 8:00:35 AM This report has been signed electronically. Number of Addenda: 0

## 2017-10-12 NOTE — H&P (Addendum)
Justin Estrada is an 52 y.o. male.   Chief Complaint: Patient is here for EGD. HPI: Patient is 52 year old Caucasian male who is been having intermittent chest pain for the last 3-1/2 months.  Noninvasive cardiac evaluation has been negative.  He did not respond to PPI.  He also had a gallbladder ultrasound which showed polyps but no stones.  He is due for follow-up study in about 8 weeks.  He denies nausea or vomiting.  He has intermittent epigastric pain.  He denies melena or rectal bleeding.  He has good appetite and his weight has been stable.  He does not take OTC NSAIDs. He has a history of fissure.  He underwent exam under general anesthesia by Dr. Henreitta LeberBridges with excision of anal tag.  He was noted to have a small fissure which is left alone as he is asymptomatic.  Past Medical History:  Diagnosis Date  . Medical history non-contributory         Anal fissure.  Past Surgical History:  Procedure Laterality Date  . COLONOSCOPY     10 plus years ago by Dr.Rourk  . COLONOSCOPY N/A 07/28/2016   Procedure: COLONOSCOPY;  Surgeon: Malissa Hippoehman, Najeeb U, MD;  Location: [facility];  Service: Endoscopy;  Laterality: N/A;  730-rescheduled to 6/4 same time per Dewayne HatchAnn  . EXCISION OF SKIN TAG N/A 09/30/2017   Procedure: EXCISION OF PERIANAL SKIN TAGS;  Surgeon: Lucretia RoersBridges, Lindsay C, MD;  Location: [facility] ORS;  Service: General;  Laterality: N/A;  . foot surgery  for bone spurs Bilateral   . UPPER GASTROINTESTINAL ENDOSCOPY     10 plus years ago by Dr.Rourk    Family History  Problem Relation Age of Onset  . Hypertension Mother   . Hypertension Father    Social History:  reports that he has never smoked. He has never used smokeless tobacco. He reports that he does not drink alcohol or use drugs.  Allergies: No Known Allergies  Medications Prior to Admission  Medication Sig Dispense Refill  . diclofenac (VOLTAREN) 75 MG EC tablet Take 75 mg by mouth daily as needed (for inflammation/pain.).    Marland Kitchen. diltiazem 2 %  GEL Apply 1 application topically at bedtime as needed (anal fissure). Use as directed. 30 g 1  . docusate sodium (COLACE) 100 MG capsule Take 1 capsule (100 mg total) by mouth 2 (two) times daily. 60 capsule 0  . psyllium (METAMUCIL SMOOTH TEXTURE) 28 % packet Take 1 packet by mouth at bedtime.      No results found for this or any previous visit (from the past 48 hour(s)). No results found.  ROS  Blood pressure 128/78, pulse 60, temperature 97.6 F (36.4 C), temperature source Oral, resp. rate 12, SpO2 97 %. Physical Exam  Constitutional: He appears well-developed and well-nourished.  HENT:  Mouth/Throat: Oropharynx is clear and moist.  Eyes: Conjunctivae are normal. No scleral icterus.  Neck: No thyromegaly present.  Cardiovascular: Normal rate, regular rhythm and normal heart sounds.  No murmur heard. Respiratory: Effort normal and breath sounds normal.  GI:  Abdomen is full but soft and nontender with organomegaly or masses.  Musculoskeletal: He exhibits no edema.  Lymphadenopathy:    He has no cervical adenopathy.  Neurological: He is alert.  Skin: Skin is warm and dry.     Assessment/Plan Noncardiac chest pain and epigastric pain. Diagnostic EGD.  Lionel DecemberNajeeb Rehman, MD 10/12/2017, 7:31 AM

## 2017-10-12 NOTE — Discharge Instructions (Signed)
Resume usual medications as before. Resume usual diet. No driving for 24 hours. Physician will call with biopsy results.  Gastrointestinal Endoscopy, Care After Refer to this sheet in the next few weeks. These instructions provide you with information about caring for yourself after your procedure. Your health care provider may also give you more specific instructions. Your treatment has been planned according to current medical practices, but problems sometimes occur. Call your health care provider if you have any problems or questions after your procedure. What can I expect after the procedure? After your procedure, it is common to feel:  Bloated.  Soreness in your throat.  Sleepy.  Follow these instructions at home:  Do not drive for 24 hours if you received a if you received a medicine to help you relax (sedative).  Avoid drinking warm beverages and alcohol for the first 24 hours after the procedure.  Take over-the-counter and prescription medicines only as told by your health care provider.  Drink enough fluids to keep your urine clear or pale yellow.  If you feel bloated, try going for a walk. Walking may help the feeling go away.  If your throat is sore, try gargling with salt water. Get help right away if:  You have severe nausea or vomiting.  You have severe abdominal pain, abdominal cramps that last longer than 6 hours, or abdominal swelling.  You have severe shoulder or back pain.  You have trouble swallowing.  You have shortness of breath, your breathing is shallow, or you breathing is faster than normal.  You have a fever.  Your heart is beating very fast.  You vomit blood or material that looks like coffee grounds.  You have bloody, black, or tarry stools.

## 2017-10-16 ENCOUNTER — Encounter (HOSPITAL_COMMUNITY): Payer: Self-pay | Admitting: Internal Medicine

## 2017-10-17 ENCOUNTER — Ambulatory Visit
Admission: RE | Admit: 2017-10-17 | Discharge: 2017-10-17 | Disposition: A | Discharge status: Home / Self Care | Payer: BC Managed Care – PPO | Source: Ambulatory Visit | Attending: Orthopedic Surgery | Admitting: Orthopedic Surgery

## 2017-10-17 DIAGNOSIS — M66322 Spontaneous rupture of flexor tendons, left upper arm: Secondary | ICD-10-CM

## 2017-10-27 ENCOUNTER — Encounter (INDEPENDENT_AMBULATORY_CARE_PROVIDER_SITE_OTHER): Payer: Self-pay

## 2017-10-28 ENCOUNTER — Encounter (INDEPENDENT_AMBULATORY_CARE_PROVIDER_SITE_OTHER): Payer: Self-pay

## 2017-10-28 ENCOUNTER — Encounter: Payer: Self-pay | Admitting: Orthopedic Surgery

## 2017-10-28 ENCOUNTER — Ambulatory Visit: Payer: BC Managed Care – PPO | Admitting: Orthopedic Surgery

## 2017-10-28 VITALS — BP 117/73 | HR 68 | Ht 71.0 in | Wt 240.0 lb

## 2017-10-28 DIAGNOSIS — M66322 Spontaneous rupture of flexor tendons, left upper arm: Secondary | ICD-10-CM | POA: Diagnosis not present

## 2017-10-28 NOTE — Progress Notes (Signed)
Routine follow-up  Chief Complaint  Patient presents with  . Shoulder Pain    left    52 year old male history of biceps tendinitis, after falling out of bed back in March of this year initial treatment was activity modification no heavy lifting for 3 weeks, he reinjured the shoulder lifting some boxes so I sent him for MRI and it shows he has interstitial tearing of the biceps tendon on the intra-articular portion with severe tendinopathy no rotator cuff tear AC joint arthrosis which clinically is asymptomatic  Review of Systems  Neurological: Negative for tingling.   He says his symptoms are intermittent depending on arm position but basically overall improved  MRI interpretation multiplanar images standard sequencing MRI left shoulder  The rotator cuff is intact the acromioclavicular joint shows arthritis  The anterior articular portion of the biceps tendon shows severe disease with degeneration and interstitial tearing   MRI report   IMPRESSION: Severe tendinopathy and interstitial tearing of the intra-articular long head of biceps.   Mild to moderate appearing rotator cuff tendinopathy without tear.   Moderate acromioclavicular osteoarthritis.   Small volume of subacromial/subdeltoid fluid compatible with bursitis.     Electronically Signed   By: Drusilla Kanner M.D.   On: 10/19/2017 08:53   Encounter Diagnosis  Name Primary?  Marland Kitchen Nontraumatic rupture of left long head biceps tendon Yes   He can continue with over-the-counter medication as needed ibuprofen or Tylenol  He will come back of the shoulder worsens

## 2017-11-09 ENCOUNTER — Ambulatory Visit (INDEPENDENT_AMBULATORY_CARE_PROVIDER_SITE_OTHER): Payer: BC Managed Care – PPO | Admitting: Internal Medicine

## 2017-11-16 ENCOUNTER — Encounter (INDEPENDENT_AMBULATORY_CARE_PROVIDER_SITE_OTHER): Payer: Self-pay | Admitting: *Deleted

## 2017-11-24 ENCOUNTER — Other Ambulatory Visit (INDEPENDENT_AMBULATORY_CARE_PROVIDER_SITE_OTHER): Payer: Self-pay | Admitting: *Deleted

## 2017-11-24 DIAGNOSIS — K824 Cholesterolosis of gallbladder: Secondary | ICD-10-CM

## 2017-12-03 ENCOUNTER — Ambulatory Visit (HOSPITAL_COMMUNITY): Payer: BC Managed Care – PPO

## 2017-12-08 ENCOUNTER — Encounter: Payer: Self-pay | Admitting: Family Medicine

## 2017-12-21 ENCOUNTER — Ambulatory Visit (HOSPITAL_COMMUNITY)
Admission: RE | Admit: 2017-12-21 | Discharge: 2017-12-21 | Disposition: A | Discharge status: Home / Self Care | Payer: BC Managed Care – PPO | Source: Ambulatory Visit | Attending: Internal Medicine | Admitting: Internal Medicine

## 2017-12-21 DIAGNOSIS — K760 Fatty (change of) liver, not elsewhere classified: Secondary | ICD-10-CM | POA: Insufficient documentation

## 2017-12-21 DIAGNOSIS — K824 Cholesterolosis of gallbladder: Secondary | ICD-10-CM | POA: Insufficient documentation

## 2017-12-23 ENCOUNTER — Encounter (INDEPENDENT_AMBULATORY_CARE_PROVIDER_SITE_OTHER): Payer: Self-pay

## 2018-01-19 ENCOUNTER — Other Ambulatory Visit: Payer: Self-pay | Admitting: Family Medicine

## 2018-01-19 ENCOUNTER — Encounter: Payer: Self-pay | Admitting: Family Medicine

## 2018-01-19 ENCOUNTER — Ambulatory Visit: Payer: BC Managed Care – PPO | Admitting: Family Medicine

## 2018-01-19 VITALS — BP 134/80 | Temp 97.8°F | Ht 71.0 in | Wt 241.8 lb

## 2018-01-19 DIAGNOSIS — B9689 Other specified bacterial agents as the cause of diseases classified elsewhere: Secondary | ICD-10-CM

## 2018-01-19 DIAGNOSIS — J019 Acute sinusitis, unspecified: Secondary | ICD-10-CM | POA: Diagnosis not present

## 2018-01-19 MED ORDER — AMOXICILLIN 500 MG PO TABS: 500.0000 mg | ORAL_TABLET | Freq: Three times a day (TID) | ORAL | 0 refills | Status: DC

## 2018-01-19 NOTE — Progress Notes (Signed)
   Subjective:    Patient ID: Justin LynchJames T Zerbe, male    DOB: 08/30/1965, 52 y.o.   MRN: [id number]  Sinusitis  This is a new problem. Episode onset: 2 days. Associated symptoms include congestion, coughing and a sore throat. Pertinent negatives include no chills or ear pain. Treatments tried: nyquil.   Patient significant congestion coughing sinus pressure denies high fever chills sweats relates he just does not feel well denies muscle aches PMH benign symptoms present over the past few days   Review of Systems  Constitutional: Negative for activity change, chills and fever.  HENT: Positive for congestion, rhinorrhea and sore throat. Negative for ear pain.   Eyes: Negative for discharge.  Respiratory: Positive for cough. Negative for wheezing.   Cardiovascular: Negative for chest pain.  Gastrointestinal: Negative for nausea and vomiting.  Musculoskeletal: Negative for arthralgias.       Objective:   Physical Exam  Constitutional: He appears well-developed.  HENT:  Head: Normocephalic.  Mouth/Throat: Oropharynx is clear and moist. No oropharyngeal exudate.  Neck: Normal range of motion.  Cardiovascular: Normal rate, regular rhythm and normal heart sounds.  No murmur heard. Pulmonary/Chest: Effort normal and breath sounds normal. He has no wheezes.  Lymphadenopathy:    He has no cervical adenopathy.  Neurological: He exhibits normal muscle tone.  Skin: Skin is warm and dry.  Nursing note and vitals reviewed.         Assessment & Plan:  More than likely viral illness There could be some secondary sinusitis I encouraged him to give this a few days to see if he will run its course and go away If it does not improve over the next few days go ahead with the antibiotics Prescription was given Aloe up if progressive troubles

## 2018-02-03 ENCOUNTER — Ambulatory Visit: Payer: BC Managed Care – PPO | Admitting: Family Medicine

## 2018-02-03 ENCOUNTER — Encounter: Payer: Self-pay | Admitting: Family Medicine

## 2018-02-03 VITALS — BP 140/82 | Ht 71.0 in | Wt 243.0 lb

## 2018-02-03 DIAGNOSIS — M25512 Pain in left shoulder: Secondary | ICD-10-CM

## 2018-02-03 DIAGNOSIS — S29011D Strain of muscle and tendon of front wall of thorax, subsequent encounter: Secondary | ICD-10-CM

## 2018-02-03 NOTE — Progress Notes (Signed)
   Subjective:    Patient ID: Justin LynchJames T Bevel, male    DOB: 08/08/1965, 52 y.o.   MRN: [id number]  HPI  Patient is here today with complaints of left peck pain. He states he had fell over an ottoman and injured it. It is painful from time to time.He fell agoin over the dogs leach he says he has seen Dr.Harrison for this and he told him he had some tears in the area and states that he told him to hold of on surgery if it was not debilitating.   Patient having significant shoulder pain discomfort in the anterior portion of his shoulder into the posterior portion hurts with certain movements.  He has been having this for several months.  He has had a couple different falls.  He did have an MRI by Dr. Romeo AppleHarrison which showed tendinopathy and a partial tear of the long bicep tendon  In addition to this she is also having some pectoralis discomfort in the left side with certain movements sharp pain he denies any substernal tightness pressure or shortness of breath  Patient did have a thorough cardiac work-up in July including a stress echo which was negative for any type of blockages therefore he is at low risk for heart disease currently Review of Systems  Constitutional: Negative for activity change, fatigue and fever.  HENT: Negative for congestion and rhinorrhea.   Respiratory: Negative for cough and shortness of breath.   Cardiovascular: Negative for chest pain and leg swelling.  Gastrointestinal: Negative for abdominal pain, diarrhea and nausea.  Genitourinary: Negative for dysuria and hematuria.  Neurological: Negative for weakness and headaches.  Psychiatric/Behavioral: Negative for agitation and behavioral problems.       Objective:   Physical Exam  Constitutional: He appears well-nourished. No distress.  HENT:  Head: Normocephalic and atraumatic.  Eyes: Right eye exhibits no discharge. Left eye exhibits no discharge.  Neck: No tracheal deviation present.  Cardiovascular: Normal rate,  regular rhythm and normal heart sounds.  No murmur heard. Pulmonary/Chest: Effort normal and breath sounds normal. No respiratory distress.  Musculoskeletal: He exhibits no edema.  Lymphadenopathy:    He has no cervical adenopathy.  Neurological: He is alert. Coordination normal.  Skin: Skin is warm and dry.  Psychiatric: He has a normal mood and affect. His behavior is normal.  Vitals reviewed.         Assessment & Plan:  Left pectoralis pain-I do not feel that this is cardiac pain stretching exercises recommended gradually should get better may use anti-inflammatory diclofenac twice daily for the next 2 weeks  Left shoulder pain discomfort range of motion exercises and printout had been given to the patient he should do these at least 3 times a week if he is not seeing significant improvement over the next 2 weeks he ought to follow through with seeing Dr. Romeo AppleHarrison and doing some physical therapy  I do not feel this patient is having cardiac chest pain he had a thorough work-up in July I do not recommend further testing.  I did review his chest x-ray and his stress echo  Follow-up if ongoing troubles

## 2018-04-06 DIAGNOSIS — M25552 Pain in left hip: Secondary | ICD-10-CM | POA: Insufficient documentation

## 2018-04-23 ENCOUNTER — Ambulatory Visit: Payer: BC Managed Care – PPO | Admitting: Family Medicine

## 2018-04-23 ENCOUNTER — Encounter: Payer: Self-pay | Admitting: Family Medicine

## 2018-04-23 ENCOUNTER — Other Ambulatory Visit: Payer: Self-pay | Admitting: *Deleted

## 2018-04-23 VITALS — BP 124/76 | Temp 98.8°F | Ht 71.0 in | Wt 232.2 lb

## 2018-04-23 DIAGNOSIS — Z131 Encounter for screening for diabetes mellitus: Secondary | ICD-10-CM

## 2018-04-23 DIAGNOSIS — R42 Dizziness and giddiness: Secondary | ICD-10-CM | POA: Diagnosis not present

## 2018-04-23 DIAGNOSIS — R5383 Other fatigue: Secondary | ICD-10-CM | POA: Diagnosis not present

## 2018-04-23 DIAGNOSIS — Z125 Encounter for screening for malignant neoplasm of prostate: Secondary | ICD-10-CM

## 2018-04-23 DIAGNOSIS — Z13220 Encounter for screening for lipoid disorders: Secondary | ICD-10-CM

## 2018-04-23 NOTE — Progress Notes (Signed)
   Subjective:    Patient ID: Justin Estrada, male    DOB: 11-01-1965, 53 y.o.   MRN: [id number]  HPI lightheadedness off and on for one week. Eyes feel heavy and tired since yesterday.  Patient with multiple spells where he felt slightly dizzy felt like the room was turning then he felt stable but then all of a sudden he would state that it would go away Denies any current severe headaches denies unilateral numbness weakness denies falling.  No known injury.  Review of Systems  Constitutional: Negative for activity change.  HENT: Negative for congestion and rhinorrhea.   Respiratory: Negative for cough and shortness of breath.   Cardiovascular: Negative for chest pain.  Gastrointestinal: Negative for abdominal pain, diarrhea, nausea and vomiting.  Genitourinary: Negative for dysuria and hematuria.  Neurological: Negative for weakness and headaches.  Psychiatric/Behavioral: Negative for behavioral problems and confusion.       Objective:   Physical Exam Vitals signs reviewed.  Constitutional:      General: He is not in acute distress. HENT:     Head: Normocephalic and atraumatic.  Eyes:     General:        Right eye: No discharge.        Left eye: No discharge.  Neck:     Trachea: No tracheal deviation.  Cardiovascular:     Rate and Rhythm: Normal rate and regular rhythm.     Heart sounds: Normal heart sounds. No murmur.  Pulmonary:     Effort: Pulmonary effort is normal. No respiratory distress.     Breath sounds: Normal breath sounds.  Lymphadenopathy:     Cervical: No cervical adenopathy.  Skin:    General: Skin is warm and dry.  Neurological:     Mental Status: He is alert.     Coordination: Coordination normal.  Psychiatric:        Behavior: Behavior normal.    Patient denies falling asleep with driving states he does snore at night does not know if he has apnea  Finger-to-nose is normal Romberg normal optic disc appear sharp no nystagmus no ataxia       Assessment & Plan:  Intermittent lightheadedness along with fatigue Fatigue probably related to poor sleep Lightheadedness could have been viral versus other measures find no evidence of stroke tumor or underlying neurologic issue such as MS if progressive symptoms or worse or reoccurring follow-up [place] questionnaire recommended await the results Wellness with labs in the spring

## 2018-05-02 LAB — HEPATIC FUNCTION PANEL
ALBUMIN: 4.4 g/dL (ref 3.8–4.9)
ALT: 18 IU/L (ref 0–44)
AST: 10 IU/L (ref 0–40)
Alkaline Phosphatase: 83 IU/L (ref 39–117)
Bilirubin Total: 1 mg/dL (ref 0.0–1.2)
Bilirubin, Direct: 0.22 mg/dL (ref 0.00–0.40)
TOTAL PROTEIN: 6.5 g/dL (ref 6.0–8.5)

## 2018-05-02 LAB — LIPID PANEL
CHOL/HDL RATIO: 4.1 ratio (ref 0.0–5.0)
Cholesterol, Total: 139 mg/dL (ref 100–199)
HDL: 34 mg/dL — AB (ref >39)
LDL Calculated: 83 mg/dL (ref 0–99)
TRIGLYCERIDES: 110 mg/dL (ref 0–149)
VLDL Cholesterol Cal: 22 mg/dL (ref 5–40)

## 2018-05-02 LAB — BASIC METABOLIC PANEL
BUN/Creatinine Ratio: 10 (ref 9–20)
BUN: 12 mg/dL (ref 6–24)
CALCIUM: 9.4 mg/dL (ref 8.7–10.2)
CO2: 25 mmol/L (ref 20–29)
CREATININE: 1.21 mg/dL (ref 0.76–1.27)
Chloride: 109 mmol/L — ABNORMAL HIGH (ref 96–106)
GFR calc Af Amer: 80 mL/min/{1.73_m2} (ref >59)
GFR, EST NON AFRICAN AMERICAN: 69 mL/min/{1.73_m2} (ref >59)
Glucose: 91 mg/dL (ref 65–99)
Potassium: 5.1 mmol/L (ref 3.5–5.2)
SODIUM: 148 mmol/L — AB (ref 134–144)

## 2018-05-02 LAB — PSA: PROSTATE SPECIFIC AG, SERUM: 0.4 ng/mL (ref 0.0–4.0)

## 2018-05-04 ENCOUNTER — Encounter: Payer: Self-pay | Admitting: Family Medicine

## 2018-05-19 ENCOUNTER — Encounter: Payer: BC Managed Care – PPO | Admitting: Family Medicine

## 2018-06-02 ENCOUNTER — Encounter: Payer: Self-pay | Admitting: Family Medicine

## 2018-06-03 ENCOUNTER — Other Ambulatory Visit: Payer: Self-pay

## 2018-06-03 ENCOUNTER — Ambulatory Visit (INDEPENDENT_AMBULATORY_CARE_PROVIDER_SITE_OTHER): Payer: BC Managed Care – PPO | Admitting: Family Medicine

## 2018-06-03 DIAGNOSIS — G44209 Tension-type headache, unspecified, not intractable: Secondary | ICD-10-CM | POA: Diagnosis not present

## 2018-06-03 NOTE — Progress Notes (Signed)
   Subjective:    Patient ID: Justin Estrada, male    DOB: Mar 14, 1965, 53 y.o.   MRN: [id number] Video visit Patient at home I was present at office Coronavirus outbreak precluded office visit HPI  Patient arrives with a tight/fullness feeling around eyes/forehead for awhile. Patient states it comes and goes but has been having problems last 3 days. Patient at times gets it behind the back of his occipital region on the right side other times it is across the forehead sometimes it is a slight dizzy feeling other times a slight headache does not wake him up at night no nausea no double vision no unilateral numbness or weakness is never had this problem before.  Patient denies any scalp tenderness. 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   I discussed the limitations of evaluation and management by telemedicine and the availability of in person appointments. The patient expressed understanding and agreed to proceed.  History of Present Illness:    Observations/Objective:   Assessment and Plan:   Follow Up Instructions:    I discussed the assessment and treatment plan with the patient. The patient was provided an opportunity to ask questions and all were answered. The patient agreed with the plan and demonstrated an understanding of the instructions.   The patient was advised to call back or seek an in-person evaluation if the symptoms worsen or if the condition fails to improve as anticipated.  I provided 15 minutes of non-face-to-face time during this encounter.      Review of Systems     Objective:   Physical Exam   Patient did not appear in any distress over the video      Assessment & Plan:  Intermittent forehead tension along with possible occipital nerve irritation  I do not feel the patient needs any type of emergent MRIs or scans He will monitor over the course the next few weeks how this is doing He does have a cyst on his forehead but I do not feel it is related to that If his symptomatology does not improve over the next few weeks I would recommend neurology consult May use Tylenol as needed Check blood pressures intermittently send [place] some readings

## 2018-06-17 ENCOUNTER — Ambulatory Visit (INDEPENDENT_AMBULATORY_CARE_PROVIDER_SITE_OTHER): Payer: BC Managed Care – PPO | Admitting: Family Medicine

## 2018-06-17 ENCOUNTER — Encounter: Payer: Self-pay | Admitting: Family Medicine

## 2018-06-17 ENCOUNTER — Other Ambulatory Visit: Payer: Self-pay

## 2018-06-17 VITALS — Wt 230.0 lb

## 2018-06-17 DIAGNOSIS — H00015 Hordeolum externum left lower eyelid: Secondary | ICD-10-CM | POA: Diagnosis not present

## 2018-06-17 MED ORDER — GENTAMICIN SULFATE 0.3 % OP SOLN: OPHTHALMIC | 0 refills | Status: DC

## 2018-06-17 NOTE — Progress Notes (Signed)
   Subjective:  Video pus audio  Patient ID: Justin Estrada, male    DOB: [birth year], 53 y.o.   MRN: [id number]  Eye Pain   The left eye is affected. Chronicity: started to hurt on Tuesday night. The pain is mild. He does not wear contacts. Associated symptoms include an eye discharge and eye redness. Associated symptoms comments: Irritation, "wetness" to eye at time, eye matted after sleeping. Pt states looks like their is head to the "sty". . Treatments tried: hot compresses. The treatment provided mild relief.   Virtual Visit via Video Note  I connected with Justin Estrada on 06/17/18 at 11:30 AM EDT by a video enabled telemedicine application and verified that I am speaking with the correct person using two identifiers.   I discussed the limitations of evaluation and management by telemedicine and the availability of in person appointments. The patient expressed understanding and agreed to proceed.  History of Present Illness:    Observations/Objective:   Assessment and Plan:   Follow Up Instructions:    I discussed the assessment and treatment plan with the patient. The patient was provided an opportunity to ask questions and all were answered. The patient agreed with the plan and demonstrated an understanding of the instructions.   The patient was advised to call back or seek an in-person evaluation if the symptoms worsen or if the condition fails to improve as anticipated.  I provided * of non-face-to-face time during this encounter.   Marlowe Shores, LPN    Review of Systems  Eyes: Positive for pain, discharge and redness.       Objective:   Physical Exam  Virtual visit.  Exam does reveal swollen lower eye      Assessment & Plan:  Impression probable stye with secondary inflammation and discomfort.  Discussed.  Local measures discussed.  Warning signs discussed.  Antibiotic eyedrops and proper use discussed  Greater than 50% of this 15 minute face to  face visit was spent in counseling and discussion and coordination of Estrada regarding the above diagnosis/diagnosies

## 2018-06-23 ENCOUNTER — Encounter: Payer: Self-pay | Admitting: Family Medicine

## 2018-06-24 ENCOUNTER — Other Ambulatory Visit: Payer: Self-pay

## 2018-06-24 ENCOUNTER — Ambulatory Visit (INDEPENDENT_AMBULATORY_CARE_PROVIDER_SITE_OTHER): Payer: BC Managed Care – PPO | Admitting: Family Medicine

## 2018-06-24 DIAGNOSIS — G44209 Tension-type headache, unspecified, not intractable: Secondary | ICD-10-CM | POA: Diagnosis not present

## 2018-06-24 MED ORDER — FLUTICASONE PROPIONATE 50 MCG/ACT NA SUSP: 2.0000 | Freq: Every day | NASAL | 5 refills | Status: DC

## 2018-06-24 NOTE — Progress Notes (Signed)
   Subjective:    Patient ID: Justin Estrada, male    DOB: Mar 12, 1965, 53 y.o.   MRN: [id number]  HPI Patient calls to follow up on headaches. Patient states the headaches are not often at all. He feels a small ache that kind of lets him know it is there and then it goes away. It might occur a few times in the day. Patient is having some pain with his neck but no headaches wake him up during the night. Patient is not having any double vision, nausea, vomiting, forgetfulness, or major headaches. They do not worsen with walking. The pain feels like a tired feeling and the ache is very short. The pressure above his eyes comes mainly when he squints or when driving.   Patient would also like to discuss how sometimes, he get anxious when driving and he notices it more then compared to when he is in the house. Patient relates depth perception such as being on a tall bridge or around tall buildings causes problems  Patient also states he is not waking up with headaches no vomiting double vision or blurred vision  No numbness tingling  Virtual Visit via Video Note  I connected with Justin Estrada on 06/24/18 at  3:00 PM EDT by a video enabled telemedicine application and verified that I am speaking with the correct person using two identifiers.  Location: Patient: home Provider: office   I discussed the limitations of evaluation and management by telemedicine and the availability of in person appointments. The patient expressed understanding and agreed to proceed.  History of Present Illness:    Observations/Objective:   Assessment and Plan:   Follow Up Instructions:    I discussed the assessment and treatment plan with the patient. The patient was provided an opportunity to ask questions and all were answered. The patient agreed with the plan and demonstrated an understanding of the instructions.   The patient was advised to call back or seek an in-person evaluation if the symptoms  worsen or if the condition fails to improve as anticipated.  I provided 15 minutes of non-face-to-face time during this encounter.     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

## 2018-06-24 NOTE — Telephone Encounter (Signed)
Virtual visit scheduled today with Dr Scott 

## 2018-07-12 ENCOUNTER — Encounter: Payer: Self-pay | Admitting: Family Medicine

## 2018-07-12 NOTE — Telephone Encounter (Signed)
I do not feel the patient necessarily needs to do a follow-up office visit at this time but I would recommend an office visit in approximately 4 weeks here in the office thank you

## 2018-07-21 ENCOUNTER — Telehealth: Payer: Self-pay | Admitting: Family Medicine

## 2018-07-21 NOTE — Telephone Encounter (Signed)
LVM to inform pt we will do CPE that day as well.

## 2018-07-21 NOTE — Telephone Encounter (Signed)
That would be fine 

## 2018-07-21 NOTE — Telephone Encounter (Signed)
Please let the patient know this will be fine per the Dr.

## 2018-07-21 NOTE — Telephone Encounter (Signed)
Pt has scheduled his 4 week follow up for June 15th @ 8:30. He would like to know if he can have his CPE done that day as well.

## 2018-07-21 NOTE — Telephone Encounter (Signed)
Please advise,

## 2018-08-01 ENCOUNTER — Encounter: Payer: Self-pay | Admitting: Family Medicine

## 2018-08-02 ENCOUNTER — Other Ambulatory Visit: Payer: Self-pay

## 2018-08-02 ENCOUNTER — Ambulatory Visit (INDEPENDENT_AMBULATORY_CARE_PROVIDER_SITE_OTHER): Payer: BC Managed Care – PPO | Admitting: Family Medicine

## 2018-08-02 ENCOUNTER — Encounter: Payer: Self-pay | Admitting: Family Medicine

## 2018-08-02 DIAGNOSIS — R51 Headache: Secondary | ICD-10-CM | POA: Diagnosis not present

## 2018-08-02 DIAGNOSIS — R519 Headache, unspecified: Secondary | ICD-10-CM

## 2018-08-02 NOTE — Progress Notes (Signed)
   Subjective:    Patient ID: Justin Estrada, male    DOB: [birth year], 53 y.o.   MRN: [id number] Audio Headache   This is a new problem. Episode onset: late march but got better until a couple days ago. The pain is located in the temporal (left ) region. Radiates to: left side neck pain. The pain is at a severity of 3/10. Treatments tried: flonase, allergy med.   Virtual Visit via Telephone Note  I connected with Justin Estrada on 08/02/18 at  3:50 PM EDT by telephone and verified that I am speaking with the correct person using two identifiers.  Location: Patient: home Provider: office   I discussed the limitations, risks, security and privacy concerns of performing an evaluation and management service by telephone and the availability of in person appointments. I also discussed with the patient that there may be a patient responsible charge related to this service. The patient expressed understanding and agreed to proceed.   History of Present Illness:    Observations/Objective:   Assessment and Plan:   Follow Up Instructions:    I discussed the assessment and treatment plan with the patient. The patient was provided an opportunity to ask questions and all were answered. The patient agreed with the plan and demonstrated an understanding of the instructions.   The patient was advised to call back or seek an in-person evaluation if the symptoms worsen or if the condition fails to improve as anticipated.  I provided 20 minutes of non-face-to-face time during this encounter.  Discomfort in the temple area  Primarily on the left side  uncomfortable   Dr Nicki Reaper had raised   One yr ago pt had minimal headaches  Now under more stress with sick dog, feeling more tired without sleep    Review of Systems  Neurological: Positive for headaches.       Objective:   Physical Exam  virt      Assessment & Plan:  Impression temporal headache.  Different than recent  headaches.  Patient highly anxious.  Concerned about potential for temporal arteritis which his mother had.  Will get sedimentation rate to effectively rule out.

## 2018-08-04 LAB — SEDIMENTATION RATE: Sed Rate: 11 mm/hr (ref 0–30)

## 2018-08-09 ENCOUNTER — Other Ambulatory Visit: Payer: Self-pay

## 2018-08-09 ENCOUNTER — Ambulatory Visit (INDEPENDENT_AMBULATORY_CARE_PROVIDER_SITE_OTHER): Payer: BC Managed Care – PPO | Admitting: Family Medicine

## 2018-08-09 ENCOUNTER — Encounter: Payer: Self-pay | Admitting: Family Medicine

## 2018-08-09 VITALS — BP 130/88 | Temp 97.3°F | Ht 71.0 in | Wt 241.0 lb

## 2018-08-09 DIAGNOSIS — R51 Headache: Secondary | ICD-10-CM

## 2018-08-09 DIAGNOSIS — Z0000 Encounter for general adult medical examination without abnormal findings: Secondary | ICD-10-CM | POA: Diagnosis not present

## 2018-08-09 DIAGNOSIS — R519 Headache, unspecified: Secondary | ICD-10-CM

## 2018-08-09 NOTE — Progress Notes (Signed)
Subjective:    Patient ID: Justin Estrada, male    DOB: Jan 20, 1966, 53 y.o.   MRN: [id number]  HPI The patient comes in today for a wellness visit. Patient overall trying to be healthy he relates since coronavirus outbreak he has not done a good job watching his diet his weight has gone up Patient not exercising every day Denies rectal bleeding denies urinary issues Having frequent headaches frontal headaches he has been going on for several months he is not 1 tao have headaches these are starting to make him nervous he denies blurred vision with it nausea vomiting denies waking up with a headache   A review of their health history was completed.  A review of medications was also completed.  Any needed refills; none  Eating habits: sometimes healthy  Falls/  MVA accidents in past few months: none  Regular exercise: yes  Specialist pt sees on regular basis: none  Preventative health issues were discussed.   Additional concerns: follow up on headaches also. Headaches come and go. Headaches are usually right above eyes. Patient has headaches in the frontal aspects.  They can last anywhere from 30 minutes to several hours at a time more so often during the day.  They do not wake him up at night.  No double vision or vomiting with it.  These headaches did not begin until the past several months.  The patient is above the age of 37  Review of Systems  Constitutional: Negative for activity change, appetite change and fever.  HENT: Negative for congestion and rhinorrhea.   Eyes: Negative for discharge.  Respiratory: Negative for cough and wheezing.   Cardiovascular: Negative for chest pain.  Gastrointestinal: Negative for abdominal pain, blood in stool and vomiting.  Genitourinary: Negative for difficulty urinating and frequency.  Musculoskeletal: Negative for neck pain.  Skin: Negative for rash.  Allergic/Immunologic: Negative for environmental allergies and food allergies.   Neurological: Negative for weakness and headaches.  Psychiatric/Behavioral: Negative for agitation.       Objective:   Physical Exam Constitutional:      Appearance: He is well-developed.  HENT:     Head: Normocephalic and atraumatic.     Right Ear: External ear normal.     Left Ear: External ear normal.     Nose: Nose normal.  Eyes:     Pupils: Pupils are equal, round, and reactive to light.  Neck:     Musculoskeletal: Normal range of motion and neck supple.     Thyroid: No thyromegaly.  Cardiovascular:     Rate and Rhythm: Normal rate and regular rhythm.     Heart sounds: Normal heart sounds. No murmur.  Pulmonary:     Effort: Pulmonary effort is normal. No respiratory distress.     Breath sounds: Normal breath sounds. No wheezing.  Abdominal:     General: Bowel sounds are normal. There is no distension.     Palpations: Abdomen is soft. There is no mass.     Tenderness: There is no abdominal tenderness.  Genitourinary:    Penis: Normal.   Musculoskeletal: Normal range of motion.  Lymphadenopathy:     Cervical: No cervical adenopathy.  Skin:    General: Skin is warm and dry.     Findings: No erythema.  Neurological:     Mental Status: He is alert.     Motor: No abnormal muscle tone.  Psychiatric:        Behavior: Behavior normal.  Judgment: Judgment normal.    Prostate exam normal Labs reviewed overall look good HDL is low with LDL slightly elevated but not to the level of being on a statin       Assessment & Plan:  Adult wellness-complete.wellness physical was conducted today. Importance of diet and exercise were discussed in detail.  In addition to this a discussion regarding safety was also covered. We also reviewed over immunizations and gave recommendations regarding current immunization needed for age.  In addition to this additional areas were also touched on including: Preventative health exams needed:  Colonoscopy 2018 next one is 10 years   Patient was advised yearly wellness exam  Frequent headaches Patient been having frequent headaches for the past several months frontal headaches.  Has tried rest, allergy medicines, avoidance of caffeine still having frequent headaches it is reasonable at this point time to do MRI to rule out possibility of underlying structural issues given his age and frequency of headaches

## 2018-08-10 ENCOUNTER — Telehealth: Payer: Self-pay | Admitting: Family Medicine

## 2018-08-10 NOTE — Telephone Encounter (Signed)
This was signed thank you 

## 2018-08-10 NOTE — Telephone Encounter (Signed)
Faxed order to [facility]

## 2018-08-10 NOTE — Telephone Encounter (Signed)
Please sign order for MRI so I may fax  In red folder in basket on wall

## 2018-08-20 ENCOUNTER — Encounter: Payer: Self-pay | Admitting: Family Medicine

## 2018-08-20 ENCOUNTER — Telehealth: Payer: Self-pay | Admitting: Family Medicine

## 2018-08-20 NOTE — Telephone Encounter (Signed)
MRI came back normal.  No sign of any type of tumor growth.  Patient actually doing better.  He will just watch for now.  No need for medications or referral to neurology If ongoing troubles he will let us know he will give [place] an update in a month

## 2018-08-31 ENCOUNTER — Encounter: Payer: Self-pay | Admitting: Family Medicine

## 2018-08-31 DIAGNOSIS — R519 Headache, unspecified: Secondary | ICD-10-CM

## 2018-09-01 ENCOUNTER — Encounter: Payer: Self-pay | Admitting: Neurology

## 2018-09-01 ENCOUNTER — Encounter: Payer: Self-pay | Admitting: Family Medicine

## 2018-09-01 NOTE — Addendum Note (Signed)
Addended by: Vicente Males on: 09/01/2018 11:37 AM   Modules accepted: Orders

## 2018-09-01 NOTE — Telephone Encounter (Signed)
Please go ahead and initiate referral to [facility] neurology Regarding frequent frontal headaches Please send notification to the patient that the referral has been initiated typically he should be able to hear from them within the next 10 days when his appointment is

## 2018-09-21 NOTE — Progress Notes (Signed)
NEUROLOGY CONSULTATION NOTE  Justin Estrada MRN: [id number] DOB: 04/04/1965  Referring provider: Lilyan PuntScott Luking, MD Primary care provider: Lilyan PuntScott Luking, MD  Reason for consult:  headaches  HISTORY OF PRESENT ILLNESS: Justin Estrada is a 53 year old male who presents for headaches.  History supplemented by referring provider note.  Onset:  No prior history of headache.  Started in March. Location:  Mainly across forehead, sometimes temples and back of head. Quality:  Pressure, certain areas feel tender to touch Intensity:  Most a 3/10.  He denies new headache, thunderclap headache or severe headache that wakes him from sleep. Aura:  none Premonitory Phase:  none Postdrome:  none Associated symptoms:  None.  He denies associated nausea, vomiting, photophobia, phonophobia, visual disturbance or unilateral numbness or weakness. Duration:  A few seconds in the temples and back of head, frontal pressure may last longer Frequency:  Sporadic.  May have a week without incident.  Average 10 days a month Frequency of abortive medication: once or twice Triggers:  Anxiety, sleep depravation Relieving factors:  rest Activity:  Does not aggravate Around this time, he reported increased anxiety and poor sleep.  It has since improved and headaches have improved as well.  Sed rate from 08/03/18 was 11. He had an MRI of brain without contrast on 08/18/18 which was unremarkable.  Current NSAIDS:  none Current analgesics:  Excedrin once or twice Current triptans:  none Current ergotamine:  none Current anti-emetic:  none Current muscle relaxants:  none Current anti-anxiolytic:  none Current sleep aide:  none Current Antihypertensive medications:  none Current Antidepressant medications:  none Current Anticonvulsant medications:  none Current anti-CGRP:  none Current Vitamins/Herbal/Supplements:  none Current Antihistamines/Decongestants:  Flonase Other therapy:  Chiropractic for neck tightness   Past NSAIDS:  none Past analgesics:  Tylenol Past abortive triptans:  none Past abortive ergotamine:  none Past muscle relaxants:  none Past anti-emetic:  none Past antihypertensive medications:  none Past antidepressant medications:  none Past anticonvulsant medications:  none Past anti-CGRP:  none Past vitamins/Herbal/Supplements:  none Past antihistamines/decongestants:  none Other past therapies:  none  Caffeine:  2 cups of coffee in morning.  1 Mt Dew a day. Diet:  Drinks a lot of water Exercise:  Tries to walk 2 miles a day Depression:  no; Anxiety:  some Other pain:  no Sleep hygiene:  Varies.  Sometimes poor due to his dogs.  Family history of headache:  Mom (headaches, temporal arteritis)  PAST MEDICAL HISTORY: Past Medical History:  Diagnosis Date  . Medical history non-contributory     PAST SURGICAL HISTORY: Past Surgical History:  Procedure Laterality Date  . BIOPSY  10/12/2017   Procedure: BIOPSY;  Surgeon: Malissa Hippoehman, Najeeb U, MD;  Location: [facility];  Service: Endoscopy;;  antral biopsy   . COLONOSCOPY     10 plus years ago by Dr.Rourk  . COLONOSCOPY N/A 07/28/2016   Procedure: COLONOSCOPY;  Surgeon: Malissa Hippoehman, Najeeb U, MD;  Location: [facility];  Service: Endoscopy;  Laterality: N/A;  730-rescheduled to 6/4 same time per Dewayne HatchAnn  . ESOPHAGOGASTRODUODENOSCOPY N/A 10/12/2017   Procedure: ESOPHAGOGASTRODUODENOSCOPY (EGD);  Surgeon: Malissa Hippoehman, Najeeb U, MD;  Location: [facility];  Service: Endoscopy;  Laterality: N/A;  730  . EXCISION OF SKIN TAG N/A 09/30/2017   Procedure: EXCISION OF PERIANAL SKIN TAGS;  Surgeon: Lucretia RoersBridges, Lindsay C, MD;  Location: [facility];  Service: General;  Laterality: N/A;  . foot surgery  for bone spurs Bilateral   .  UPPER GASTROINTESTINAL ENDOSCOPY     10 plus years ago by Dr.Rourk    MEDICATIONS: Current Outpatient Medications on File Prior to Visit  Medication Sig Dispense Refill  . diltiazem 2 % GEL Apply 1 application topically at  bedtime as needed (anal fissure). Use as directed. 30 g 1  . docusate sodium (COLACE) 100 MG capsule Take 1 capsule (100 mg total) by mouth 2 (two) times daily. 60 capsule 0  . fluticasone (FLONASE) 50 MCG/ACT nasal spray Place 2 sprays into both nostrils daily. 16 g 5  . Homeopathic Products (ALLERGY MEDICINE PO) Take by mouth. otc allergy med    . psyllium (METAMUCIL SMOOTH TEXTURE) 28 % packet Take 1 packet by mouth at bedtime.     No current facility-administered medications on file prior to visit.     ALLERGIES: No Known Allergies  FAMILY HISTORY: Family History  Problem Relation Age of Onset  . Hypertension Mother   . Hypertension Father    SOCIAL HISTORY: Social History   Socioeconomic History  . Marital status: Married    Spouse name: Not on file  . Number of children: Not on file  . Years of education: Not on file  . Highest education level: Not on file  Occupational History  . Not on file  Social Needs  . Financial resource strain: Not on file  . Food insecurity    Worry: Not on file    Inability: Not on file  . Transportation needs    Medical: Not on file    Non-medical: Not on file  Tobacco Use  . Smoking status: Never Smoker  . Smokeless tobacco: Never Used  Substance and Sexual Activity  . Alcohol use: No    Alcohol/week: 0.0 standard drinks  . Drug use: No  . Sexual activity: Yes    Birth control/protection: None  Lifestyle  . Physical activity    Days per week: Not on file    Minutes per session: Not on file  . Stress: Not on file  Relationships  . Social Musicianconnections    Talks on phone: Not on file    Gets together: Not on file    Attends religious service: Not on file    Active member of club or organization: Not on file    Attends meetings of clubs or organizations: Not on file    Relationship status: Not on file  . Intimate partner violence    Fear of current or ex partner: Not on file    Emotionally abused: Not on file    Physically  abused: Not on file    Forced sexual activity: Not on file  Other Topics Concern  . Not on file  Social History Narrative  . Not on file    REVIEW OF SYSTEMS: Constitutional: No fevers, chills, or sweats, no generalized fatigue, change in appetite Eyes: No visual changes, double vision, eye pain Ear, nose and throat: No hearing loss, ear pain, nasal congestion, sore throat Cardiovascular: No chest pain, palpitations Respiratory:  No shortness of breath at rest or with exertion, wheezes GastrointestinaI: No nausea, vomiting, diarrhea, abdominal pain, fecal incontinence Genitourinary:  No dysuria, urinary retention or frequency Musculoskeletal:  No neck pain, back pain Integumentary: No rash, pruritus, skin lesions Neurological: as above Psychiatric: No depression, insomnia, anxiety Endocrine: No palpitations, fatigue, diaphoresis, mood swings, change in appetite, change in weight, increased thirst Hematologic/Lymphatic:  No purpura, petechiae. Allergic/Immunologic: no itchy/runny eyes, nasal congestion, recent allergic reactions, rashes  PHYSICAL EXAM: Blood  pressure 130/79, pulse 63, temperature 98.4 F (36.9 C), height 5\' 11"  (1.803 m), weight 242 lb (109.8 kg), SpO2 97 %. General: No acute distress.  Patient appears well-groomed.   Head:  Normocephalic/atraumatic Eyes:  fundi examined but not visualized Neck: supple, mild bilateral posterior tenderness, full range of motion Back: No paraspinal tenderness Heart: regular rate and rhythm Lungs: Clear to auscultation bilaterally. Vascular: No carotid bruits. Neurological Exam: Mental status: alert and oriented to person, place, and time, recent and remote memory intact, fund of knowledge intact, attention and concentration intact, speech fluent and not dysarthric, language intact. Cranial nerves: CN I: not tested CN II: pupils equal, round and reactive to light, visual fields intact CN III, IV, VI:  full range of motion, no  nystagmus, no ptosis CN V: facial sensation intact CN VII: upper and lower face symmetric CN VIII: hearing intact CN IX, X: gag intact, uvula midline CN XI: sternocleidomastoid and trapezius muscles intact CN XII: tongue midline Bulk & Tone: normal, no fasciculations. Motor:  5/5 throughout  Sensation: temperature and vibration sensation intact.   Deep Tendon Reflexes:  2+ throughout, toes downgoing.   Finger to nose testing:  Without dysmetria.   Heel to shin:  Without dysmetria.   Gait:  Normal station and stride.  Able to turn and tandem walk. Romberg negative.  IMPRESSION: Duration is quite brief but otherwise sounds consistent with a tension-type headache.  He has some neck tightness, which may be contributing as well.  Certainly could have been triggered by the increased stress and lack of sleep at beginning of COVID.  Now improved, so hopefully will continue to improve.  He would like to avoid medications at this time.  If needed, he may contact me for referral to [facility] for treatment of his neck pain (such as osteopathic manipulative medicine)  Thank you for allowing me to take part in the care of this patient.  Metta Clines, DO  CC: Sallee Lange, MD

## 2018-09-22 ENCOUNTER — Ambulatory Visit (INDEPENDENT_AMBULATORY_CARE_PROVIDER_SITE_OTHER): Payer: BC Managed Care – PPO | Admitting: Neurology

## 2018-09-22 ENCOUNTER — Other Ambulatory Visit: Payer: Self-pay

## 2018-09-22 ENCOUNTER — Encounter: Payer: Self-pay | Admitting: Neurology

## 2018-09-22 VITALS — BP 130/79 | HR 63 | Temp 98.4°F | Ht 71.0 in | Wt 242.0 lb

## 2018-09-22 DIAGNOSIS — G44219 Episodic tension-type headache, not intractable: Secondary | ICD-10-CM

## 2018-09-22 DIAGNOSIS — M542 Cervicalgia: Secondary | ICD-10-CM | POA: Diagnosis not present

## 2018-09-22 NOTE — Patient Instructions (Signed)
I don't see any red flags.   If you would like to see somebody for neck issues, contact me and I can refer you to one of two Sports Medicine physicians that I know

## 2018-09-27 ENCOUNTER — Encounter: Payer: Self-pay | Admitting: Family Medicine

## 2018-09-29 ENCOUNTER — Other Ambulatory Visit: Payer: Self-pay

## 2018-09-30 ENCOUNTER — Ambulatory Visit (INDEPENDENT_AMBULATORY_CARE_PROVIDER_SITE_OTHER): Payer: BC Managed Care – PPO | Admitting: Family Medicine

## 2018-09-30 ENCOUNTER — Encounter: Payer: Self-pay | Admitting: Family Medicine

## 2018-09-30 ENCOUNTER — Other Ambulatory Visit: Payer: Self-pay

## 2018-09-30 VITALS — BP 130/82 | Temp 96.3°F | Wt 236.2 lb

## 2018-09-30 DIAGNOSIS — M542 Cervicalgia: Secondary | ICD-10-CM | POA: Diagnosis not present

## 2018-09-30 NOTE — Progress Notes (Signed)
Subjective:    Patient ID: Justin Estrada, male    DOB: Mar 08, 1965, 53 y.o.   MRN: [id number] Video visit Very nice patient HPI Pt here today for follow up on dizziness. Pt states that he has a cloudy like feeling in head area above eyes. Pt was referred to neurologist and they did test. Pt states that he feel better when he is doing something. Pt states that when he sits down is when the feeling comes around. Not constant. Pt also having headaches; pt feels fuzzy at times.  Pt also having neck pain. Pt has being going to chiropractor and the chiropractor states that his neck muscles are inflamed. The neck pain is constant and hurts more when he moves his head. Pt states that sometimes the base of his neck hurts and will cause a small headache. Pt states chiropractor used Chin[place]el to rub on area.    The patient states the pain in the neck does go into the trapezius on the right side but does not go down the arm or into the hand The patient also relates the 1 of the dizzy spells he was sitting at a restaurant leaning back and he suddenly felt off balance had to sit up quickly another time he felt briefly dizzy did not have any unilateral numbness or weakness did not break out any bad sweats no nausea or vomiting  He relates last summer he was at a activity in [place] and he felt very lightheaded shaky and dizzy but he thinks it was because he ate a large amount of pancakes earlier that morning and had not had anything else to eat once he sat down and had a snack he felt better Review of Systems  Constitutional: Negative for activity change, fatigue and fever.  HENT: Negative for congestion and rhinorrhea.   Respiratory: Negative for cough and shortness of breath.   Cardiovascular: Negative for chest pain and leg swelling.  Gastrointestinal: Negative for abdominal pain, diarrhea and nausea.  Genitourinary: Negative for dysuria and hematuria.  Neurological: Negative for weakness and headaches.   Psychiatric/Behavioral: Negative for agitation and behavioral problems.       Objective:   Physical Exam Vitals signs reviewed.  Cardiovascular:     Rate and Rhythm: Normal rate and regular rhythm.     Heart sounds: Normal heart sounds. No murmur.  Pulmonary:     Effort: Pulmonary effort is normal.     Breath sounds: Normal breath sounds.  Lymphadenopathy:     Cervical: No cervical adenopathy.  Neurological:     Mental Status: He is alert.  Psychiatric:        Behavior: Behavior normal.    Finger-to-nose is normal Romberg is normal EOMI normal no abnormal gait  Patient does have some tenderness right posterior neck and when he turns his head to the far left it radiates toward his trapezius     Assessment & Plan:  Brief dizziness I would not recommend any type of progressive testing at this point.  If it becomes worse we can teach the patient Epley maneuver and also potentially referral for ENT for further evaluation  Neck pain is possible he has cervical impingement intermittently depending on position of his neck anti-inflammatory for short course is reasonable but the patient was told that if he has ongoing trouble he ought to consider a cervical spine x-rays and certainly if it starts progressing down his arm or over the next couple months becomes unbearable MRI of the neck

## 2018-10-05 ENCOUNTER — Encounter (INDEPENDENT_AMBULATORY_CARE_PROVIDER_SITE_OTHER): Payer: Self-pay | Admitting: Internal Medicine

## 2018-10-05 ENCOUNTER — Other Ambulatory Visit: Payer: Self-pay

## 2018-10-05 ENCOUNTER — Ambulatory Visit (INDEPENDENT_AMBULATORY_CARE_PROVIDER_SITE_OTHER): Payer: BC Managed Care – PPO | Admitting: Internal Medicine

## 2018-10-05 VITALS — BP 117/80 | HR 66 | Temp 98.0°F | Resp 18 | Ht 71.0 in | Wt 235.7 lb

## 2018-10-05 DIAGNOSIS — Z8719 Personal history of other diseases of the digestive system: Secondary | ICD-10-CM

## 2018-10-05 MED ORDER — NYSTATIN-TRIAMCINOLONE 100000-0.1 UNIT/GM-% EX CREA: 1.0000 "application " | TOPICAL_CREAM | Freq: Two times a day (BID) | CUTANEOUS | 1 refills | Status: DC

## 2018-10-05 NOTE — Progress Notes (Signed)
Presenting complaint;  History of anal fissure.  Database and subjective:  Patient is 53 year old Caucasian male who has history of recurrent anal fissure was last seen in the office on 10/09/2017 for atypical/noncardiac chest pain and underwent EGD on 10/12/2017. He had prepyloric scarring and antral gastritis.  H. pylori serology was negative.  He had been on NSAID at that time.  He finally responded to PPI therapy. His last colonoscopy was in June 2018 for averages screening.  He had small external hemorrhoids and anal tags. Because of persistent symptoms he was referred to Dr. Blake Divine and underwent examination under anesthesia on 8 09/20/2017 and small anal tag was excised.  Anal fissure completely healed.  He said he has not had any more chest pain.  He also denies heartburn.  He continues to have intermittent anal burning and is using diltiazem gel on as-needed basis.  He may use a couple of days in a row and he feels better.  He has noticed some itching after he uses gel.  He remains on stool softener and Metamucil daily.  His bowels move daily.  He is neither prone to constipation not diarrhea.  He denies melena or rectal bleeding. He says he walks at least 6 to 8 miles per week.  Current Medications: Outpatient Encounter Medications as of 10/05/2018  Medication Sig  . diltiazem 2 % GEL Apply 1 application topically at bedtime as needed (anal fissure). Use as directed.  . docusate sodium (COLACE) 100 MG capsule Take 1 capsule (100 mg total) by mouth 2 (two) times daily.  . psyllium (METAMUCIL SMOOTH TEXTURE) 28 % packet Take 1 packet by mouth at bedtime.   No facility-administered encounter medications on file as of 10/05/2018.      Objective: Blood pressure 117/80, pulse 66, temperature 98 F (36.7 C), temperature source Oral, resp. rate 18, height 5\' 11"  (1.803 m), weight 235 lb 11.2 oz (106.9 kg). Patient is alert and in no acute distress. Conjunctiva is pink. Sclera is  nonicteric Oropharyngeal mucosa is normal. No neck masses or thyromegaly noted. Cardiac exam with regular rhythm normal S1 and S2. No murmur or gallop noted. Lungs are clear to auscultation. Abdomen is full but soft and nontender with organomegaly or masses. Rectal examination was limited to external inspection and palpation.  There is no anal tag induration or tenderness. No LE edema or clubbing noted.   Assessment:  #1.  History of anal fissure.  He had exam under anesthesia last year and no fissure was noted.  He had small anal tag sites.  He remains with mild intermittent symptoms.  He is getting relief with as needed diltiazem gel.  Luckily he is not having any side effects such as headache.  Last colonoscopy was in June 2018. No indication for sigmoidoscopy or further work-up.  He can use Mycolog-II cream on as-needed basis for pruritus ani.   Plan:  Sherren Mocha will continue Colace and Metamucil daily as before. He will continue to use diltiazem gel on as-needed basis. Mycolog-II cream to be applied to perianal area twice daily as needed. Office visit on as-needed basis. Next screening colonoscopy due in June 2028.

## 2018-10-05 NOTE — Patient Instructions (Signed)
Use Mycolog cream on as-needed basis

## 2018-10-27 ENCOUNTER — Other Ambulatory Visit: Payer: Self-pay

## 2018-10-27 ENCOUNTER — Ambulatory Visit (INDEPENDENT_AMBULATORY_CARE_PROVIDER_SITE_OTHER): Payer: BC Managed Care – PPO | Admitting: Family Medicine

## 2018-10-27 DIAGNOSIS — R42 Dizziness and giddiness: Secondary | ICD-10-CM

## 2018-10-27 DIAGNOSIS — R0789 Other chest pain: Secondary | ICD-10-CM | POA: Diagnosis not present

## 2018-10-27 NOTE — Progress Notes (Signed)
   Subjective:    Patient ID: Justin Estrada, male    DOB: [birth year], 53 y.o.   MRN: [id number]  HPI  Patient calls for a follow up on dizziness. Patient states it is doing better and only happening occasionally. Patient states he has appt with eye doctor today. He does get intermittent spells where he feels like the room is moving and other times where he feels slightly dizzy he states the headache part is actually been doing much better Occasionally gets left pectoralis muscle pain he states he has been doing some moving He also relates intermittent burping and belching but no chest tightness pressure pain with activity PMH benign Virtual Visit via Video Note  I connected with Justin Estrada on 10/27/18 at  8:30 AM EDT by a video enabled telemedicine application and verified that I am speaking with the correct person using two identifiers.  Location: Patient: home Provider: office   I discussed the limitations of evaluation and management by telemedicine and the availability of in person appointments. The patient expressed understanding and agreed to proceed.  History of Present Illness:    Observations/Objective:   Assessment and Plan:   Follow Up Instructions:    I discussed the assessment and treatment plan with the patient. The patient was provided an opportunity to ask questions and all were answered. The patient agreed with the plan and demonstrated an understanding of the instructions.   The patient was advised to call back or seek an in-person evaluation if the symptoms worsen or if the condition fails to improve as anticipated.  I provided 15  minutes of non-face-to-face time during this encounter.       Review of Systems     Objective:   Physical Exam  Patient had virtual visit Appears to be in no distress Atraumatic Neuro able to relate and oriented No apparent resp distress Color normal       Assessment & Plan:  Overall patient states that the  intermittent dizzy spells are less they seem to be triggered by movement-not only his movement but movement of things around him-he will monitor all of this closely  He also gets occasional gas and belching related issues but denies any type of major setbacks there-he denies substernal chest pressure tightness with walking  He plans on increasing walking to build cardiovascular fitness  He will be seeing the eye specialist coming up today will send [place] a report  Has occasional left pectoral muscle pain no need to do any interventions currently

## 2018-11-02 ENCOUNTER — Telehealth (INDEPENDENT_AMBULATORY_CARE_PROVIDER_SITE_OTHER): Payer: Self-pay | Admitting: Internal Medicine

## 2018-11-02 ENCOUNTER — Encounter (INDEPENDENT_AMBULATORY_CARE_PROVIDER_SITE_OTHER): Payer: Self-pay

## 2018-11-02 MED ORDER — PANTOPRAZOLE SODIUM 40 MG PO TBEC: 40.0000 mg | DELAYED_RELEASE_TABLET | Freq: Every day | ORAL | 3 refills | Status: DC

## 2018-11-02 NOTE — Telephone Encounter (Signed)
Patient's call returned. He is having chest discomfort like he had last year in March and he responded to pantoprazole.  He is not having any shortness of breath.  He has talk with Dr. Nicki Reaper looking and he does not feel it is cardiac. Patient is not taking NSAIDs. Patient advised to take famotidine OTC 40 mg tonight and 40 mg in the morning. He will start pantoprazole 40 mg daily before breakfast but he can take first dose tomorrow before lunch. Patient will call with progress report early next week.  If he is not feeling better we will plan to see him in the office.

## 2018-11-04 ENCOUNTER — Other Ambulatory Visit: Payer: Self-pay

## 2018-11-04 ENCOUNTER — Ambulatory Visit (INDEPENDENT_AMBULATORY_CARE_PROVIDER_SITE_OTHER): Payer: BC Managed Care – PPO | Admitting: Family Medicine

## 2018-11-04 DIAGNOSIS — R1013 Epigastric pain: Secondary | ICD-10-CM

## 2018-11-04 NOTE — Progress Notes (Signed)
Subjective:    Patient ID: Justin Estrada, male    DOB: 01-10-1966, 53 y.o.   MRN: [id number]  HPI Pt having discomfort in sternum-stomach area. Pt states he is not sure if it is acid reflux or what it is. Pt GI doctor, Dr.Rehman, placed him on Pantoprazole 40 mg one tablet po before breakfast. Pt started that yesterday. Pt states he has had some bouts of diarrhea.  Patient denies any bloody stools denies mucousy stools states that the Pepcid helped pantoprazole starting to help.  It does not wake him up at night.  He denies fever sweats or chills.  He denies overuse of caffeine but he does have caffeine in the morning and again at supper he also eats some unhealthy foods but for the most part trying to be healthy.  Patient understands he does need to lose some weight.  Denies any chest pressure tightness denies shortness of breath  Pt would also like to touch base on sleep study that was recommended. Pt states he is not able to sleep through the night. He wakes up and tosses and turns.  Patient also states at night he wakes up tosses and turns and then able to go back to sleep denies being anxious denies being overly worried about stuff.  He does state at times he feels like he does not breathe as deep as he might should but he does not know if he snores he denies falling asleep during the day but sometimes feels fatigued during the day  Virtual Visit via Video Note  I connected with Justin Estrada on 11/04/18 at  3:00 PM EDT by a video enabled telemedicine application and verified that I am speaking with the correct person using two identifiers.  Location: Patient: home Provider: office   I discussed the limitations of evaluation and management by telemedicine and the availability of in person appointments. The patient expressed understanding and agreed to proceed.  History of Present Illness:    Observations/Objective:   Assessment and Plan:   Follow Up Instructions:    I discussed  the assessment and treatment plan with the patient. The patient was provided an opportunity to ask questions and all were answered. The patient agreed with the plan and demonstrated an understanding of the instructions.   The patient was advised to call back or seek an in-person evaluation if the symptoms worsen or if the condition fails to improve as anticipated.  I provided 18 minutes of non-face-to-face time during this encounter.   Vicente Males, LPN   Review of Systems  Constitutional: Negative for activity change.  HENT: Negative for congestion and rhinorrhea.   Respiratory: Negative for cough and shortness of breath.   Cardiovascular: Negative for chest pain.  Gastrointestinal: Positive for diarrhea and nausea. Negative for abdominal pain and vomiting.  Genitourinary: Negative for dysuria and hematuria.  Neurological: Negative for weakness and headaches.  Psychiatric/Behavioral: Negative for behavioral problems and confusion.       Objective:   Physical Exam   Patient had virtual visit Appears to be in no distress Atraumatic Neuro able to relate and oriented No apparent resp distress Color normal      Assessment & Plan:  Probable dyspepsia versus gastritis the PPI is a good idea.  Should gradually help him.  May use Maalox or Mylanta as needed.  If not dramatically improved over the next 7 to 14 days I do recommend for him to follow-up with gastroenterology.  Nighttime difficulty with  sleep I think it would be best for this patient to cut back on caffeine.  He needs to be careful about portion control toward evening time.  Walking exercises during the day would be helpful.  May use melatonin as needed.  Avoid digital screen time close to bedtime.  Warning signs regarding sleep apnea and discussed.  His wife will observe him sleeping.  If ongoing troubles he will notify us we will set up sleep study.  Patient was counseled to watch portions exercise try to lose weight   Patient counseled to minimize caffeine chocolates and tomato based products

## 2018-11-05 ENCOUNTER — Encounter (INDEPENDENT_AMBULATORY_CARE_PROVIDER_SITE_OTHER): Payer: Self-pay

## 2018-11-11 ENCOUNTER — Encounter (INDEPENDENT_AMBULATORY_CARE_PROVIDER_SITE_OTHER): Payer: Self-pay | Admitting: Internal Medicine

## 2018-11-11 ENCOUNTER — Other Ambulatory Visit: Payer: Self-pay

## 2018-11-11 ENCOUNTER — Ambulatory Visit (INDEPENDENT_AMBULATORY_CARE_PROVIDER_SITE_OTHER): Payer: BC Managed Care – PPO | Admitting: Internal Medicine

## 2018-11-11 ENCOUNTER — Encounter (INDEPENDENT_AMBULATORY_CARE_PROVIDER_SITE_OTHER): Payer: Self-pay | Admitting: *Deleted

## 2018-11-11 VITALS — BP 127/87 | HR 64 | Temp 97.7°F | Ht 71.0 in | Wt 239.2 lb

## 2018-11-11 DIAGNOSIS — R140 Abdominal distension (gaseous): Secondary | ICD-10-CM | POA: Diagnosis not present

## 2018-11-11 DIAGNOSIS — K824 Cholesterolosis of gallbladder: Secondary | ICD-10-CM | POA: Diagnosis not present

## 2018-11-11 DIAGNOSIS — R1013 Epigastric pain: Secondary | ICD-10-CM | POA: Diagnosis not present

## 2018-11-11 MED ORDER — SIMETHICONE 180 MG PO CAPS: 180.0000 mg | ORAL_CAPSULE | Freq: Three times a day (TID) | ORAL | 0 refills | Status: DC | PRN

## 2018-11-11 NOTE — Progress Notes (Signed)
Presenting complaint;  Epigastric pain bloating and burping.  Subjective:  Justin Estrada is 53 year old Caucasian male who has history of Anal fissure and has been doing well with as needed diltiazem gel, Colace 100 mg daily and Mycolog-II cream as needed who was evaluated last year for chest pain of 3 months duration.  Cardiac evaluation was negative.  He underwent esophagogastroduodenoscopy on 10/12/2017 revealing gastric antral scar but no evidence of erosive reflux esophagitis.  It was felt his symptoms may have been secondary to NSAID use. He was doing well when he was last seen on 10/05/2018. Patient called our office on 11/02/2018 complaining of chest discomfort without shortness of breath.  He also noted mild epigastric discomfort bloating and frequent belching.  He states belching would provide him with relief.  He did not have nausea vomiting or melena.  He had not taken any NSAIDs this time.  He says the symptoms reminded him of symptoms that he had last year.  I recommended famotidine 40 mg at evening followed by pantoprazole 40 mg daily.  Patient says his chest pain resolved.  However he felt his bloating was worse.  He therefore stopped pantoprazole after 4 or 5 doses.  Now he is having intermittent bloating worse after meals.  He feels the best when he wakes up in the morning.  He states his lunch generally eats oatmeal.  He stays away from fatty and fried foods.  His bowels been very regular.  He denies melena or rectal bleeding.  His appetite is good and his weight has been stable.  He does not smoke cigarettes or drink alcohol.  He has a history of gallbladder polyp.  Last ultrasound was in October 2019 and was decided to bring him back for repeat study in October 2020.   Current Medications: Outpatient Encounter Medications as of 11/11/2018  Medication Sig  . diltiazem 2 % GEL Apply 1 application topically at bedtime as needed (anal fissure). Use as directed.  . docusate sodium (COLACE) 100  MG capsule Take 1 capsule (100 mg total) by mouth 2 (two) times daily.  Marland Kitchen nystatin-triamcinolone (MYCOLOG II) cream Apply 1 application topically 2 (two) times daily.  . psyllium (METAMUCIL SMOOTH TEXTURE) 28 % packet Take 1 packet by mouth at bedtime.  . pantoprazole (PROTONIX) 40 MG tablet Take 1 tablet (40 mg total) by mouth daily before breakfast. (Patient not taking: Reported on 11/11/2018)   No facility-administered encounter medications on file as of 11/11/2018.      Objective: Blood pressure 127/87, pulse 64, temperature 97.7 F (36.5 C), temperature source Oral, height '5\' 11"'  (1.803 m), weight 239 lb 3.2 oz (108.5 kg). Patient is alert and in no acute distress. He is wearing facial mask. Conjunctiva is pink. Sclera is nonicteric Oropharyngeal mucosa is normal. No neck masses or thyromegaly noted. Cardiac exam with regular rhythm normal S1 and S2. No murmur or gallop noted. Lungs are clear to auscultation. Abdomen is protuberant.  Bowel sounds are normal.  Percussion note is tympanitic and epigastric region.  No organomegaly or masses. No LE edema or clubbing noted.  Labs/studies Results:   Hepatic Function Latest Ref Rng & Units 05/01/2018 04/18/2017 07/23/2016  Total Protein 6.0 - 8.5 g/dL 6.5 6.7 7.4  Albumin 3.8 - 4.9 g/dL 4.4 4.4 5.0  AST 0 - 40 IU/L '10 15 20  ' ALT 0 - 44 IU/L '18 21 29  ' Alk Phosphatase 39 - 117 IU/L 83 77 74  Total Bilirubin 0.0 - 1.2 mg/dL 1.0 0.7 0.5  Bilirubin, Direct 0.00 - 0.40 mg/dL 0.22 0.14 -     Assessment:  #1.  Epigastric pain and bloating.  Symptoms are suggestive of gallbladder disease or unrelated to food intake.  Esophagogastroduodenoscopy in August last year revealed antral scarring but no active disease.  Biopsy was negative for H. pylori.  He will be further evaluated with abdominal ultrasound and H. pylori serology.  #2.  History of gallbladder polyp.  Ultrasound in October 2019 revealed the polyp size to be 7.7 mm.  He is due for  follow-up study next month but given his symptoms will change plans and do an ultrasound now.  If he has developed gallstones would recommend cholecystectomy.  #3.  Fatty liver.  His LFTs have always remained normal. His BMI is 33.36.  He needs to increase physical activity. If he does end up requiring cholecystectomy would ask for liver biopsy.  #4.  History of anal fissure.  He is presently not having any issues.  His last colonoscopy was in June 2018 revealing internal hemorrhoids and anal tags.   Plan:  Use pantoprazole 40 mg daily on as-needed basis. Phazyme 185 mg p.o. 3 times daily for a week or so and thereafter on as-needed basis if it helps. H. pylori serology. Office visit on as-needed basis.

## 2018-11-11 NOTE — Patient Instructions (Signed)
Can use pantoprazole on as-needed basis for heartburn. Phazyme 1 capsule up to 3 times a day as needed for bloating. Physician will call with results of blood test and ultrasound when completed.

## 2018-11-12 ENCOUNTER — Encounter (INDEPENDENT_AMBULATORY_CARE_PROVIDER_SITE_OTHER): Payer: Self-pay

## 2018-11-18 LAB — H. PYLORI ANTIBODY, IGG: H. pylori, IgG AbS: 0.1 Index Value (ref 0.00–0.79)

## 2018-11-19 ENCOUNTER — Other Ambulatory Visit: Payer: Self-pay

## 2018-11-19 ENCOUNTER — Ambulatory Visit (HOSPITAL_COMMUNITY)
Admission: RE | Admit: 2018-11-19 | Discharge: 2018-11-19 | Disposition: A | Discharge status: Home / Self Care | Payer: BC Managed Care – PPO | Source: Ambulatory Visit | Attending: Internal Medicine | Admitting: Internal Medicine

## 2018-11-19 DIAGNOSIS — K824 Cholesterolosis of gallbladder: Secondary | ICD-10-CM | POA: Insufficient documentation

## 2018-11-19 DIAGNOSIS — R1013 Epigastric pain: Secondary | ICD-10-CM | POA: Insufficient documentation

## 2018-11-23 ENCOUNTER — Other Ambulatory Visit: Payer: Self-pay

## 2018-11-23 DIAGNOSIS — Z20822 Contact with and (suspected) exposure to covid-19: Secondary | ICD-10-CM

## 2018-11-24 LAB — NOVEL CORONAVIRUS, NAA: SARS-CoV-2, NAA: NOT DETECTED

## 2018-11-29 ENCOUNTER — Ambulatory Visit (INDEPENDENT_AMBULATORY_CARE_PROVIDER_SITE_OTHER): Payer: BC Managed Care – PPO | Admitting: Family Medicine

## 2018-11-29 ENCOUNTER — Other Ambulatory Visit: Payer: Self-pay

## 2018-11-29 ENCOUNTER — Encounter: Payer: Self-pay | Admitting: Family Medicine

## 2018-11-29 DIAGNOSIS — R0789 Other chest pain: Secondary | ICD-10-CM | POA: Diagnosis not present

## 2018-11-29 MED ORDER — DICLOFENAC SODIUM 75 MG PO TBEC: DELAYED_RELEASE_TABLET | ORAL | 0 refills | Status: DC

## 2018-11-29 NOTE — Progress Notes (Signed)
   Subjective:  Audio plus video  Patient ID: Justin Estrada, male    DOB: January 24, 1966, 53 y.o.   MRN: [id number]  HPI Pt is having some discomfort in pectoral area.  Pt states he does not feel it is heart related. Has seen Dr.Scott for this issue back in September. Pt states he lifted a riding lawn mower recently. Pt also had a fall about 2 years ago and has some tears in pectoral muscle area. Pt states he does have some pain intermittently. Sometimes has pain in left arm when he has it in a resting position or if he puts pressure on it. Pt has not tried any treatments at this time due to pt has not felt the need for any medication.   Pt also states he is still having some issues sleeping and would like something to help him relax at night so he can sleep.  Virtual Visit via Video Note  I connected with Justin Estrada on 11/29/18 at  1:10 PM EDT by a video enabled telemedicine application and verified that I am speaking with the correct person using two identifiers.  Location: Patient: home Provider: office   I discussed the limitations of evaluation and management by telemedicine and the availability of in person appointments. The patient expressed understanding and agreed to proceed.  History of Present Illness:    Observations/Objective:   Assessment and Plan:   Follow Up Instructions:    I discussed the assessment and treatment plan with the patient. The patient was provided an opportunity to ask questions and all were answered. The patient agreed with the plan and demonstrated an understanding of the instructions.   The patient was advised to call back or seek an in-person evaluation if the symptoms worsen or if the condition fails to improve as anticipated.  I provided 25 minutes of non-face-to-face time during this encounter.   Vicente Males, LPN  Patient admits that the chest pain on his left side worrisome.  Sharp at times.  Achy at times.  Worse with certain motions.   Patient had the pain accentuated when he turned towards the left the other day.  Patient describes a remote injury in this area.  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      Assessment & Plan:  Impression 1 chest pain.  Complete office record reviewed today in presence of patient.  Negative EKG and stress test work-up last year via cardiologist.  Chest pain very atypical.  Certainly musculoskeletal features to its description.  Patient admits to high anxiety.  Not exercising much.  I think the chance of cardiac etiology is very low.  Discussed with patient.  Would recommend a trial of anti-inflammatory medicine.  Use short-term.  History of reflux which should be able to handle it and hopefully will calm down pain.  Discussed.

## 2018-12-03 ENCOUNTER — Ambulatory Visit: Payer: BC Managed Care – PPO | Admitting: Physician Assistant

## 2018-12-03 ENCOUNTER — Other Ambulatory Visit: Payer: Self-pay

## 2018-12-03 ENCOUNTER — Encounter: Payer: Self-pay | Admitting: Physician Assistant

## 2018-12-03 VITALS — BP 138/77 | HR 92 | Temp 96.8°F | Ht 71.0 in | Wt 241.0 lb

## 2018-12-03 DIAGNOSIS — R0789 Other chest pain: Secondary | ICD-10-CM | POA: Diagnosis not present

## 2018-12-03 DIAGNOSIS — R002 Palpitations: Secondary | ICD-10-CM
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
## 2018-12-03 NOTE — Patient Instructions (Signed)
Medication Instructions:  Your physician recommends that you continue on your current medications as directed. Please refer to the Current Medication list given to you today.  If you need a refill on your cardiac medications before your next appointment, please call your pharmacy.   Lab work: NONE  If you have labs (blood work) drawn today and your tests are completely normal, you will receive your results only by: . MyChart Message (if you have MyChart) OR . A paper copy in the mail If you have any lab test that is abnormal or we need to change your treatment, we will call you to review the results.  Testing/Procedures: NONE   Follow-Up: At [facility], you and your health needs are our priority.  As part of our continuing mission to provide you with exceptional heart care, we have created designated Provider Care Teams.  These Care Teams include your primary Cardiologist (physician) and Advanced Practice Providers (APPs -  Physician Assistants and Nurse Practitioners) who all work together to provide you with the care you need, when you need it. You will need a follow up appointment in 1 years.  Please call our office 2 months in advance to schedule this appointment.  You may see Suresh Koneswaran, MD or one of the following Advanced Practice Providers on your designated Care Team:   Brittany Strader, PA-C ([place] [facility]) . Michele Lenze, PA-C ([place] [facility])  Any Other Special Instructions Will Be Listed Below (If Applicable). Thank you for choosing [facility]!     

## 2018-12-15 ENCOUNTER — Ambulatory Visit (INDEPENDENT_AMBULATORY_CARE_PROVIDER_SITE_OTHER): Payer: BC Managed Care – PPO | Admitting: Family Medicine

## 2018-12-15 ENCOUNTER — Encounter: Payer: Self-pay | Admitting: Family Medicine

## 2018-12-15 ENCOUNTER — Other Ambulatory Visit: Payer: Self-pay

## 2018-12-15 VITALS — BP 136/84 | Temp 97.2°F | Wt 242.0 lb

## 2018-12-15 DIAGNOSIS — Z23 Encounter for immunization: Secondary | ICD-10-CM | POA: Diagnosis not present

## 2018-12-15 DIAGNOSIS — M545 Low back pain: Secondary | ICD-10-CM

## 2018-12-15 MED ORDER — ALPRAZOLAM 0.25 MG PO TABS: 0.2500 mg | ORAL_TABLET | Freq: Two times a day (BID) | ORAL | 0 refills | Status: DC | PRN

## 2018-12-15 NOTE — Progress Notes (Signed)
   Subjective:    Patient ID: Justin Estrada, male    DOB: 12/21/1965, 53 y.o.   MRN: [id number]  HPI Pt is having left side pain. Pt states this pain affects different muscle groups. Sometimes in the armpit area and under left breast area. Pt did take Diclofenac for about 5 days and that did help. Pt has been to gastro and cardiologist. Pt states his anxiety has calmed down.   Patient states intermittently have back pain sometimes in the upper back sometimes around the side of the left posterior neck in addition to this sometimes in the anterior chest sharp pain shoots across pectoral region sometimes into the axilla.  Denies any major issues.  Breathing doing okay.  Saw cardiologist got reassurance there.  No further testing recommended patient also relates saw gastroenterology they felt things were going well Patient is trying to start back and exercise trying to lose some weight trying to stay more active Review of Systems  Constitutional: Negative for activity change, fatigue and fever.  HENT: Negative for congestion and rhinorrhea.   Respiratory: Negative for cough and shortness of breath.   Cardiovascular: Negative for chest pain and leg swelling.  Gastrointestinal: Negative for abdominal pain, diarrhea and nausea.  Genitourinary: Negative for dysuria and hematuria.  Neurological: Negative for weakness and headaches.  Psychiatric/Behavioral: Negative for agitation and behavioral problems.       Objective:   Physical Exam Constitutional:      General: He is not in acute distress.    Appearance: He is well-developed.  HENT:     Head: Normocephalic.  Cardiovascular:     Rate and Rhythm: Normal rate and regular rhythm.     Heart sounds: Normal heart sounds. No murmur.  Pulmonary:     Effort: Pulmonary effort is normal.     Breath sounds: Normal breath sounds.  Skin:    General: Skin is warm and dry.  Neurological:     Mental Status: He is alert.  Psychiatric:        Behavior:  Behavior normal.    On examination the chest wall is nontender to palpation no masses are felt axilla on both sides is normal no masses upper trapezius left side feels fine.       Assessment & Plan:  Intermittent muscle related pains recommend stretching exercises anti-inflammatories when necessary but do not rely on anti-inflammatories on a regular basis  Patient also encouraged to exercise and try to lose weight  Patient does get stressed at times anxious at times states he would benefit from having mild nerve medication to use on a rare basis I did discuss with him only use this when at home not with driving Xanax 4.40 mg half tablet as needed twice daily as needed caution drowsiness home use only

## 2018-12-22 ENCOUNTER — Encounter: Payer: Self-pay | Admitting: Family Medicine

## 2018-12-30 ENCOUNTER — Other Ambulatory Visit: Payer: Self-pay

## 2018-12-30 DIAGNOSIS — Z20822 Contact with and (suspected) exposure to covid-19: Secondary | ICD-10-CM

## 2018-12-31 LAB — NOVEL CORONAVIRUS, NAA: SARS-CoV-2, NAA: NOT DETECTED

## 2019-01-03 ENCOUNTER — Other Ambulatory Visit (INDEPENDENT_AMBULATORY_CARE_PROVIDER_SITE_OTHER): Payer: Self-pay | Admitting: *Deleted

## 2019-01-03 ENCOUNTER — Encounter (INDEPENDENT_AMBULATORY_CARE_PROVIDER_SITE_OTHER): Payer: Self-pay

## 2019-01-03 DIAGNOSIS — K602 Anal fissure, unspecified: Secondary | ICD-10-CM

## 2019-01-03 MED ORDER — DILTIAZEM GEL 2 %: 1.0000 "application " | Freq: Every evening | CUTANEOUS | 1 refills | Status: DC | PRN

## 2019-01-07 ENCOUNTER — Encounter: Payer: Self-pay | Admitting: Family Medicine

## 2019-01-12 ENCOUNTER — Other Ambulatory Visit: Payer: Self-pay

## 2019-01-12 DIAGNOSIS — Z20822 Contact with and (suspected) exposure to covid-19: Secondary | ICD-10-CM

## 2019-01-14 ENCOUNTER — Other Ambulatory Visit: Payer: Self-pay

## 2019-01-14 ENCOUNTER — Ambulatory Visit (INDEPENDENT_AMBULATORY_CARE_PROVIDER_SITE_OTHER): Payer: BC Managed Care – PPO | Admitting: Family Medicine

## 2019-01-14 ENCOUNTER — Encounter: Payer: Self-pay | Admitting: Family Medicine

## 2019-01-14 DIAGNOSIS — U071 COVID-19: Secondary | ICD-10-CM | POA: Diagnosis not present

## 2019-01-14 LAB — NOVEL CORONAVIRUS, NAA: SARS-CoV-2, NAA: DETECTED — AB

## 2019-01-14 NOTE — Progress Notes (Signed)
   Subjective:    Patient ID: Justin Estrada, male    DOB: 19-Mar-1965, 53 y.o.   MRN: [id number]  HPI cough started Tuesday and noticed a fever that night. Got tested for covid 19 on Wednesday and got positive test result today. Runny nose started today. Body aches around waist line and back. Tried advil for fever. No fever now. Pt states he feels pretty good.  Denies any wheezing difficulty breathing relates some sore throat head congestion drainage coughing body aches PMH benign Virtual Visit via Video Note  I connected with Justin Estrada on 01/14/19 at  3:50 PM EST by a video enabled telemedicine application and verified that I am speaking with the correct person using two identifiers.  Location: Patient: home Provider: office   I discussed the limitations of evaluation and management by telemedicine and the availability of in person appointments. The patient expressed understanding and agreed to proceed.  History of Present Illness:    Observations/Objective:   Assessment and Plan:   Follow Up Instructions:    I discussed the assessment and treatment plan with the patient. The patient was provided an opportunity to ask questions and all were answered. The patient agreed with the plan and demonstrated an understanding of the instructions.   The patient was advised to call back or seek an in-person evaluation if the symptoms worsen or if the condition fails to improve as anticipated.  I provided 16 minutes of non-face-to-face time during this encounter.     Review of Systems  Constitutional: Negative for activity change, chills and fever.  HENT: Positive for congestion and rhinorrhea. Negative for ear pain.   Eyes: Negative for discharge.  Respiratory: Positive for cough. Negative for wheezing.   Cardiovascular: Negative for chest pain.  Gastrointestinal: Negative for nausea and vomiting.  Musculoskeletal: Negative for arthralgias.       Objective:   Physical Exam    Patient had virtual visit Appears to be in no distress Atraumatic Neuro able to relate and oriented No apparent resp distress Color normal      Assessment & Plan:  Covid infection Warnings discussed in detail When to go to ER discussed What to watch for If shortness of breath severe chest pain disorientation high fevers or worse go to ER

## 2019-01-14 NOTE — Telephone Encounter (Signed)
Nurses Please communicate with Sherren Mocha  Certainly starting Stanton Kidney has a symptoms it is important for him to watch closely for progressive illness because sometimes that can occur 7 to 10 days after becoming positive  Should he want to do a virtual visit we could work him in at the end of the day  Some standard recommendations are printed within the following  These are standard recommendations we give to patients that we see for mild Covid    The patient was counseled regarding the following.  Possibility exists that patient may have Covid19.  This is a virus that causes severe flulike symptoms.  The majority of individuals have mild illness and are able to recover at home.  There is no antibiotic or treatment for this.  No local testing exists for this.  Patient was educated regarding the warning signs.  Warning signs include trouble breathing, passing out or near syncope, persistent pain or pressure in the chest, new confusion or difficulty to arouse, bluish lips, unable to keep liquids down.  If emergency symptoms are occurring then-if patient feels they are having medical emergency they are to call 911.  Otherwise they need to go to the emergency department.  For milder cases home care is the best approach.  Home care minimizes exposure of the infected patient to others.  It is wise to self isolate. 10 ways to manage respiratory symptoms at home-per CDC guidelines  #1 stay at home-stay home from work and away from other public places.  If having to go out it is important to avoid public transportation ride sharing etc. #2 monitor your symptoms carefully-if symptoms worsen or show signs of emergent issues ER care may be necessary.  If more routine issues may call office for advice. #3 stay rested and stay hydrated. #4 if medical emergencies call 911 and notify dispatch personnel that you may have [phone] #5 cover your cough and sneezes #6 wash your hands often with soap and water for at least  20 seconds or use alcohol based hand sanitizer that contains at least 60% alcohol #7 as much as possible stay in a specific room at your home and stay away from other people in your home.  If possible please use separate bathroom.  If you have to be around other people in or outside of the home wear a facemask. #8 avoid sharing personal items-such as dishes, towels, bedding, do not drink after each other #9 clean all surfaces that are touched often like counters tabletops doorknobs use household cleaning sprays or wipes according to the label instructions #10 this illness can vary from person to person but most people over several days will gradually improve  Home self isolation guidelines CDC recommendations  Patients with symptoms consistent with Covid 19 should stay in self-isolation until: At least 3 days have passed since recovery-this is defined as no fevers (without medications), improvement in respiratory symptoms, and at least 7 days have passed since the symptoms first appeared.  Additional information available at http://www.wolf.info/ and  also www.C19check.com is a good website that educates when a person should consider going to the ER versus home care.  The patient would just have to put in various information and it will automatically give advice.  Should the patient need further advice from [place] to call.

## 2019-01-17 ENCOUNTER — Ambulatory Visit: Payer: BC Managed Care – PPO | Admitting: Family Medicine

## 2019-01-18 ENCOUNTER — Encounter: Payer: Self-pay | Admitting: Family Medicine

## 2019-01-28 ENCOUNTER — Encounter: Payer: Self-pay | Admitting: Family Medicine

## 2019-02-01 ENCOUNTER — Encounter: Payer: Self-pay | Admitting: Family Medicine

## 2019-02-01 ENCOUNTER — Other Ambulatory Visit: Payer: Self-pay

## 2019-02-01 ENCOUNTER — Ambulatory Visit (HOSPITAL_COMMUNITY)
Admission: RE | Admit: 2019-02-01 | Discharge: 2019-02-01 | Disposition: A | Discharge status: Home / Self Care | Payer: BC Managed Care – PPO | Source: Ambulatory Visit | Attending: Family Medicine | Admitting: Family Medicine

## 2019-02-01 ENCOUNTER — Ambulatory Visit: Payer: BC Managed Care – PPO | Admitting: Family Medicine

## 2019-02-01 ENCOUNTER — Encounter (HOSPITAL_COMMUNITY): Payer: Self-pay

## 2019-02-01 VITALS — BP 128/86 | Temp 98.0°F | Ht 71.0 in | Wt 243.0 lb

## 2019-02-01 DIAGNOSIS — R059 Cough, unspecified: Secondary | ICD-10-CM

## 2019-02-01 DIAGNOSIS — R0789 Other chest pain: Secondary | ICD-10-CM | POA: Diagnosis present

## 2019-02-01 DIAGNOSIS — M7918 Myalgia, other site: Secondary | ICD-10-CM | POA: Diagnosis not present

## 2019-02-01 DIAGNOSIS — R05 Cough: Secondary | ICD-10-CM

## 2019-02-01 DIAGNOSIS — R1011 Right upper quadrant pain: Secondary | ICD-10-CM | POA: Insufficient documentation

## 2019-02-01 MED ORDER — MELOXICAM 7.5 MG PO TABS: 7.5000 mg | ORAL_TABLET | Freq: Every day | ORAL | 0 refills | Status: DC

## 2019-02-01 NOTE — Progress Notes (Signed)
Subjective:    Patient ID: Justin Estrada, male    DOB: 03/06/1965, 53 y.o.   MRN: [id number]  HPIrecheck on dizziness and chest pain patient has intermittent dizziness spells is more of a funny full feeling in the for head region as well as the frontal sinuses lasts for anywhere from 3 to 5 seconds occur several different times per day denies any double vision or true vertigo with it.  . Pt states he has been having discomfortant on both sides of chest for about 8 weeks. Did pickup the front end of a lawn mower when it started but not getting any better. Saw dr Brett Canales on 10/5 and cardiology on 10/9.  They did not feel this was cardiac.  He has intermittent sharp pains in the chest sometimes lasts for a few seconds sometimes for several minutes they come and go denies any shortness of breath with it  Pt states dizziness is better. Has not had any in the past couple of days.   Patient also to some degree is lost sense of smell from recent Covid infection.  Energy level doing better still has occasional dry cough no shortness of breath  Patient also states the muscles in his back gets tight and feel like there is knots in them and wonders what he should do for that.  Patient also relates intermittent right upper quadrant discomfort that radiates to his flank has a history of gallbladder polyp  Review of Systems  Constitutional: Negative for activity change, chills and fever.  HENT: Negative for congestion, ear pain and rhinorrhea.   Eyes: Negative for discharge.  Respiratory: Positive for cough. Negative for shortness of breath and wheezing.   Cardiovascular: Negative for chest pain.  Gastrointestinal: Positive for abdominal pain. Negative for constipation, nausea and vomiting.  Musculoskeletal: Negative for arthralgias.       Objective:   Physical Exam Vitals signs reviewed.  Constitutional:      General: He is not in acute distress. HENT:     Head: Normocephalic and atraumatic.   Eyes:     General:        Right eye: No discharge.        Left eye: No discharge.  Neck:     Trachea: No tracheal deviation.  Cardiovascular:     Rate and Rhythm: Normal rate and regular rhythm.     Heart sounds: Normal heart sounds. No murmur.  Pulmonary:     Effort: Pulmonary effort is normal. No respiratory distress.     Breath sounds: Normal breath sounds.  Lymphadenopathy:     Cervical: No cervical adenopathy.  Skin:    General: Skin is warm and dry.  Neurological:     Mental Status: He is alert.     Coordination: Coordination normal.  Psychiatric:        Behavior: Behavior normal.    Some tightness noted in the rhomboid muscles bilateral No tenderness in the chest wall muscles Right upper quadrant no tenderness or guarding or rebound       Assessment & Plan:  Gallbladder polyp If he has ongoing troubles right upper quadrant discomfort HIDA test is next  Musculoskeletal chest pain I do not feel this is cardiac try Mobic over the course of the next couple weeks  Occasional popping noise in the back of his throat when he opens his mouth probably air released from the esophagus I find no evidence of any problems on physical exam  Intermittent tightness of the rhomboid  muscle stretches and massage recommended  Intermittent frontal headache/dizziness patient had an MRI in the past I really feel this is just more musculoskeletal versus sinus pressure may need further evaluation with ENT if ongoing troubles

## 2019-02-06 ENCOUNTER — Encounter: Payer: Self-pay | Admitting: Family Medicine

## 2019-02-06 DIAGNOSIS — R1011 Right upper quadrant pain: Secondary | ICD-10-CM

## 2019-02-09 ENCOUNTER — Telehealth: Payer: Self-pay | Admitting: Family Medicine

## 2019-02-09 ENCOUNTER — Other Ambulatory Visit: Payer: Self-pay | Admitting: Family Medicine

## 2019-02-09 ENCOUNTER — Encounter: Payer: Self-pay | Admitting: Family Medicine

## 2019-02-09 DIAGNOSIS — R079 Chest pain, unspecified: Secondary | ICD-10-CM

## 2019-02-09 MED ORDER — DEXILANT 60 MG PO CPDR: 60.0000 mg | DELAYED_RELEASE_CAPSULE | Freq: Every day | ORAL | 2 refills | Status: DC

## 2019-02-09 NOTE — Telephone Encounter (Signed)
Nurses please put in for a cardiology consult because of reoccurring chest pain

## 2019-02-09 NOTE — Telephone Encounter (Signed)
It should be noted that I had a good conversation with the patient on the phone regarding his reoccurring chest pain  We will stop meloxicam Start Dexilant Order HIDA Readdress a follow-up cardiology visit Even though the likelihood of cardiac disease is low patient is concerned and I believe it needs to be thoroughly ruled out More than likely this is musculoskeletal but he has not responded to anti-inflammatories plus also we are now looking into the possibility of esophageal versus gallbladder disease may well need to have GI consult again for EGD I do not feel CT scan of the chest is necessary

## 2019-02-09 NOTE — Telephone Encounter (Signed)
Nurses I had a good discussion with the patient by phone this evening  Patient having ongoing upper abdominal discomfort on the right side plus also having bilateral armpit pain worse on the left side than the right side  Patient was advised to stop meloxicam I will send in a PPI  Please set up patient for a HIDA scan because of right upper quadrant pain and discomfort-please set up and notify patient  I will also be communicating with cardiology regarding the patient I believe he will need to have some further test to absolutely rule out the possibility of cardiac disease but we will see what cardiology has to say

## 2019-02-10 ENCOUNTER — Encounter: Payer: Self-pay | Admitting: Family Medicine

## 2019-02-10 ENCOUNTER — Telehealth: Payer: Self-pay | Admitting: Cardiovascular Disease

## 2019-02-10 NOTE — Addendum Note (Signed)
Addended by: Vicente Males on: 02/10/2019 10:17 AM   Modules accepted: Orders

## 2019-02-10 NOTE — Addendum Note (Signed)
Addended by: Vicente Males on: 02/10/2019 01:11 PM   Modules accepted: Orders

## 2019-02-10 NOTE — Telephone Encounter (Signed)
Cardiology referral placed

## 2019-02-10 NOTE — Telephone Encounter (Signed)

## 2019-02-14 ENCOUNTER — Encounter: Payer: Self-pay | Admitting: Physician Assistant

## 2019-02-14 ENCOUNTER — Telehealth (INDEPENDENT_AMBULATORY_CARE_PROVIDER_SITE_OTHER): Payer: BC Managed Care – PPO | Admitting: Physician Assistant

## 2019-02-14 ENCOUNTER — Encounter: Payer: Self-pay | Admitting: *Deleted

## 2019-02-14 VITALS — Ht 71.0 in | Wt 235.0 lb

## 2019-02-14 DIAGNOSIS — R0789 Other chest pain: Secondary | ICD-10-CM

## 2019-02-14 DIAGNOSIS — R079 Chest pain, unspecified: Secondary | ICD-10-CM

## 2019-02-14 DIAGNOSIS — Z7189 Other specified counseling: Secondary | ICD-10-CM

## 2019-02-14 DIAGNOSIS — Z8619 Personal history of other infectious and parasitic diseases: Secondary | ICD-10-CM

## 2019-02-14 NOTE — Patient Instructions (Signed)
Medication Instructions:  Your physician recommends that you continue on your current medications as directed. Please refer to the Current Medication list given to you today.  *If you need a refill on your cardiac medications before your next appointment, please call your pharmacy*  Lab Work: NONE  If you have labs (blood work) drawn today and your tests are completely normal, you will receive your results only by: Marland Kitchen MyChart Message (if you have MyChart) OR . A paper copy in the mail If you have any lab test that is abnormal or we need to change your treatment, we will call you to review the results.  Testing/Procedures: Your physician has requested that you have a lexiscan myoview. For further information please visit [email or web address]. Please follow instruction sheet, as given.  Follow-Up: At [facility], you and your health needs are our priority.  As part of our continuing mission to provide you with exceptional heart care, we have created designated Provider Care Teams.  These Care Teams include your primary Cardiologist (physician) and Advanced Practice Providers (APPs -  Physician Assistants and Nurse Practitioners) who all work together to provide you with the care you need, when you need it.  Your next appointment:   2-3  month(s)  The format for your next appointment:   In Person  Provider:   Kate Sable, MD  Other Instructions Thank you for choosing [facility]!

## 2019-02-14 NOTE — Progress Notes (Signed)
Virtual Visit via Video Note   This visit type was conducted due to national recommendations for restrictions regarding the COVID-19 Pandemic (e.g. social distancing) in an effort to limit this patient's exposure and mitigate transmission in our community.  Due to his co-morbid illnesses, this patient is at least at moderate risk for complications without adequate follow up.  This format is felt to be most appropriate for this patient at this time.  All issues noted in this document were discussed and addressed.  A limited physical exam was performed with this format.  Please refer to the patient's chart for his consent to telehealth for [facility].  Evaluation Performed:  Follow-up visit  This visit type was conducted due to national recommendations for restrictions regarding the COVID-19 Pandemic (e.g. social distancing).  This format is felt to be most appropriate for this patient at this time.  All issues noted in this document were discussed and addressed.  No physical exam was performed (except for noted visual exam findings with Video Visits).  Please refer to the patient's chart (MyChart message for video visits and phone note for telephone visits) for the patient's consent to telehealth for [facility].  Date:  02/14/2019   ID:  Justin Estrada, DOB May 05, 1965, MRN [id number]  Patient Location:  home  Provider location:   Office  PCP:  Babs Sciara, MD  Cardiologist:  Prentice Docker, MD 09/23/2017 Justin Estrada, [facility] 12/03/2018 Electrophysiologist:  None   Chief Complaint:  Chest pain  History of Present Illness:    Justin Estrada is a 53 y.o. male who presents via audio/video conferencing for a telehealth visit today.    53 y.o. yo male who has a hx of chest pain s/p low risk stress echo 08/2017, seen 10/09 for CP, some sx better w/ NSAIDs, some better w/ GI rx, no further testing.   Dr Gerda Diss saw for RUQ pain, HIDA scan needed, also w/ recurrent CP>>Cards to  reassess  [name] states that his previous symptoms were likely related to anxiety, and have resolved.  He caught Covid in November and was not hospitalized, but did have some issues with that.  Since he has recovered from the Covid, he has been having problems with abdominal pain in the right upper quadrant and left-sided chest pain.  Dr. Gerda Diss feels that he needs a HIDA scan, but this has not been scheduled yet.  [name] has had recurrent chest pain that is in the left lateral rib/axillary region.  It seems to come around the lower edge of his ribs towards the front and also go around to his back at times.  He will also have chest pain in the upper pectoral area that kind of comes down towards the sternal area.  The symptoms are not exertional.  Higher dose NSAIDs seem to help, but his stomach began bothering him and so he had to stop taking them.  Lower dose NSAIDs do not help.  These are different from the chest pain symptoms for which she was previously evaluated.   The patient does not have symptoms concerning for COVID-19 infection (fever, chills, cough, or new shortness of breath).    Prior CV studies:   The following studies were reviewed today:  ECHO: 09/08/2017 - Stress ECG conclusions: The stress ECG was negative for ischemia. Duke scoring: exercise time of 8 min; maximum ST deviation of 0.5 mm; no angina; resulting score is 6. This score predicts a  low risk of cardiac events. - Staged echo: There was no diagnostic evidence for stress-induced ischemia. Stress results: Maximal heart rate during stress was 176 bpm (104% of maximal predicted heart rate). The maximal predicted heart rate was 169 bpm.The target heart rate was achieved. There was resting hypertension with a hypertensive response to stress. The rate-pressure product for the peak heart rate and blood pressure was 269/89 mm Hg/min. The patient experienced no chest pain during stress.  Stress ECG:  Sinus tachycardia. The stress ECG was negative for ischemia. Duke scoring: exercise time of 8 min; maximum ST deviation of 0.5 mm; no angina; resulting score is 6. This score predicts a low risk of cardiac events.    Past Medical History:  Diagnosis Date  . Anal fissure   . Chest pain, atypical 08/2017   Normal stress echo   Past Surgical History:  Procedure Laterality Date  . BIOPSY  10/12/2017   Procedure: BIOPSY;  Surgeon: Malissa Hippoehman, Najeeb U, MD;  [facility];  Service: Endoscopy;;  antral biopsy   . COLONOSCOPY     10 plus years ago by Dr.Rourk  . COLONOSCOPY N/A 07/28/2016   Procedure: COLONOSCOPY;  Surgeon: Malissa Hippoehman, Najeeb U, MD;  [facility];  Service: Endoscopy;  Laterality: N/A;  730-rescheduled to 6/4 same time per Dewayne HatchAnn  . ESOPHAGOGASTRODUODENOSCOPY N/A 10/12/2017   Procedure: ESOPHAGOGASTRODUODENOSCOPY (EGD);  Surgeon: Malissa Hippoehman, Najeeb U, MD;  [facility];  Service: Endoscopy;  Laterality: N/A;  730  . EXCISION OF SKIN TAG N/A 09/30/2017   Procedure: EXCISION OF PERIANAL SKIN TAGS;  Surgeon: Lucretia RoersBridges, Lindsay C, MD;  Location: [facility] ORS;  Service: General;  Laterality: N/A;  . foot surgery  for bone spurs Bilateral   . UPPER GASTROINTESTINAL ENDOSCOPY     10 plus years ago by Dr.Rourk     Current Meds  Medication Sig  . diltiazem 2 % GEL Apply 1 application topically at bedtime as needed (anal fissure). Use as directed.  . docusate sodium (COLACE) 100 MG capsule Take 1 capsule (100 mg total) by mouth 2 (two) times daily.  . psyllium (METAMUCIL SMOOTH TEXTURE) 28 % packet Take 1 packet by mouth at bedtime.     Allergies:   Patient has no known allergies.   Social History   Tobacco Use  . Smoking status: Never Smoker  . Smokeless tobacco: Never Used  Substance Use Topics  . Alcohol use: No    Alcohol/week: 0.0 standard drinks  . Drug use: No     Family Hx: The patient's family history includes Atrial fibrillation in his brother;  Congestive Heart Failure in his father; Hypertension in his father and mother; Liver cancer in his mother; Lung cancer in his mother.  ROS:   Please see the history of present illness.    All other systems reviewed and are negative.   Labs/Other Tests and Data Reviewed:    Recent Labs: 05/01/2018: ALT 18; BUN 12; Creatinine, Ser 1.21; Potassium 5.1; Sodium 148   CBC    Component Value Date/Time   WBC 5.7 09/23/2017 0826   RBC 4.97 09/23/2017 0826   HGB 15.3 09/23/2017 0826   HGB 15.7 07/23/2016 1656   HCT 44.6 09/23/2017 0826   HCT 47.7 07/23/2016 1656   PLT 250 09/23/2017 0826   PLT 292 07/23/2016 1656   MCV 89.7 09/23/2017 0826   MCV 90 07/23/2016 1656   MCH 30.8 09/23/2017 0826   MCHC 34.3 09/23/2017 0826   RDW 13.0 09/23/2017  0826   RDW 13.5 07/23/2016 1656   LYMPHSABS 1.5 09/23/2017 0826   LYMPHSABS 1.8 07/23/2016 1656   MONOABS 0.4 09/23/2017 0826   EOSABS 0.1 09/23/2017 0826   EOSABS 0.1 07/23/2016 1656   BASOSABS 0.1 09/23/2017 0826   BASOSABS 0.1 07/23/2016 1656    CMP Latest Ref Rng & Units 05/01/2018 09/23/2017 07/21/2017  Glucose 65 - 99 mg/dL 91 98 86  BUN 6 - 24 mg/dL 12 13 15   Creatinine 0.76 - 1.27 mg/dL 1.21 1.03 1.02  Sodium 134 - 144 mmol/L 148(H) 142 141  Potassium 3.5 - 5.2 mmol/L 5.1 4.1 3.9  Chloride 96 - 106 mmol/L 109(H) 109 106  CO2 20 - 29 mmol/L 25 28 26   Calcium 8.7 - 10.2 mg/dL 9.4 9.0 9.2  Total Protein 6.0 - 8.5 g/dL 6.5 - -  Total Bilirubin 0.0 - 1.2 mg/dL 1.0 - -  Alkaline Phos 39 - 117 IU/L 83 - -  AST 0 - 40 IU/L 10 - -  ALT 0 - 44 IU/L 18 - -     Recent Lipid Panel Lab Results  Component Value Date/Time   CHOL 139 05/01/2018 09:10 AM   TRIG 110 05/01/2018 09:10 AM   HDL 34 (L) 05/01/2018 09:10 AM   CHOLHDL 4.1 05/01/2018 09:10 AM   LDLCALC 83 05/01/2018 09:10 AM    Wt Readings from Last 3 Encounters:  02/14/19 106.6 kg  02/01/19 110.2 kg  12/15/18 109.8 kg     Objective:    Vital Signs:  Ht 5\' 11"  (1.803 m)   Wt  106.6 kg   BMI 32.78 kg/m    53 y.o. male in no acute distress by video conference   ASSESSMENT & PLAN:    1.  Chest pain: -Although his symptoms are atypical, he has multiple cardiac risk factors -Additionally, he may need a cholecystectomy and will need cardiac clearance for this. -We will schedule a Lexiscan Myoview.  If these results are normal, I would feel confident that his chest pain is noncardiac and no further work-up would be needed prior to surgery.  2.  Recent Covid infection -He appears generally well recovered from the Covid infection -As part of his infection, he had a lot of musculoskeletal pain -It may be that the chest pain is related to musculoskeletal issues from the Covid   COVID-19 Education: The signs and symptoms of COVID-19 were discussed with the patient and how to seek care for testing (follow up with PCP or arrange E-visit).  The importance of social distancing was discussed today.  Patient Risk:   After full review of this patient's clinical status, I feel that they are at least moderate risk at this time.  Time:   Today, I have spent 15 minutes with the patient with telehealth technology discussing chest pain and other health issues.     Medication Adjustments/Labs and Tests Ordered: Current medicines are reviewed at length with the patient today.  Concerns regarding medicines are outlined above.   Tests Ordered: No orders of the defined types were placed in this encounter.  Medication Changes: No orders of the defined types were placed in this encounter.   Disposition:  Follow up with Kate Sable, MD   Signed, Rosaria Ferries, PA-C  02/14/2019 1:19 PM    [facility]

## 2019-02-14 NOTE — Addendum Note (Signed)
Addended by: Levonne Hubert on: 02/14/2019 04:37 PM   Modules accepted: Orders

## 2019-02-15 ENCOUNTER — Encounter: Payer: Self-pay | Admitting: Family Medicine

## 2019-02-16 ENCOUNTER — Other Ambulatory Visit: Payer: Self-pay | Admitting: *Deleted

## 2019-02-16 MED ORDER — DEXLANSOPRAZOLE 60 MG PO CPDR: 60.0000 mg | DELAYED_RELEASE_CAPSULE | Freq: Every day | ORAL | 5 refills | Status: DC

## 2019-02-16 NOTE — Telephone Encounter (Signed)
Pt states he feels fine. Every now and then he will cough up clear mucus but not like he has a cold. No fever, no sob. States he feels great other than his stomach discomfort and chest discomfort. And both of those are about the same as when seen.

## 2019-02-16 NOTE — Telephone Encounter (Signed)
Med sent to pharm. Pt notified and pt states hida scan is Monday and stress test is Wednesday.

## 2019-02-16 NOTE — Telephone Encounter (Signed)
Also he asked if his acid blocker was sent in or if dr Nicki Reaper was waiting til after his hida scan.

## 2019-02-16 NOTE — Telephone Encounter (Signed)
Nurses Please send in [place] 60 mg 1 daily, #30, 5 refills  Overweight HIDA testing

## 2019-02-21 ENCOUNTER — Ambulatory Visit (HOSPITAL_COMMUNITY)
Admission: RE | Admit: 2019-02-21 | Discharge: 2019-02-21 | Disposition: A | Discharge status: Home / Self Care | Payer: BC Managed Care – PPO | Source: Ambulatory Visit | Attending: Family Medicine | Admitting: Family Medicine

## 2019-02-21 ENCOUNTER — Other Ambulatory Visit: Payer: Self-pay

## 2019-02-21 ENCOUNTER — Encounter (HOSPITAL_COMMUNITY): Payer: Self-pay

## 2019-02-21 DIAGNOSIS — R1011 Right upper quadrant pain: Secondary | ICD-10-CM | POA: Diagnosis present

## 2019-02-21 DIAGNOSIS — M549 Dorsalgia, unspecified: Secondary | ICD-10-CM | POA: Diagnosis not present

## 2019-02-21 MED ORDER — TECHNETIUM TC 99M MEBROFENIN IV KIT
5.0000 | PACK | Freq: Once | INTRAVENOUS | Status: AC | PRN
  Administered 2019-02-21: 4.9 via INTRAVENOUS

## 2019-02-23 ENCOUNTER — Encounter (HOSPITAL_COMMUNITY)
Admission: RE | Admit: 2019-02-23 | Discharge: 2019-02-23 | Disposition: A | Discharge status: Home / Self Care | Payer: BC Managed Care – PPO | Source: Ambulatory Visit | Attending: Physician Assistant | Admitting: Physician Assistant

## 2019-02-23 ENCOUNTER — Ambulatory Visit (HOSPITAL_COMMUNITY)
Admission: RE | Admit: 2019-02-23 | Discharge: 2019-02-23 | Disposition: A | Discharge status: Home / Self Care | Payer: BC Managed Care – PPO | Source: Ambulatory Visit | Attending: Physician Assistant | Admitting: Physician Assistant

## 2019-02-23 ENCOUNTER — Encounter (HOSPITAL_COMMUNITY): Payer: Self-pay

## 2019-02-23 ENCOUNTER — Other Ambulatory Visit: Payer: Self-pay

## 2019-02-23 DIAGNOSIS — R0789 Other chest pain: Secondary | ICD-10-CM | POA: Insufficient documentation

## 2019-02-23 DIAGNOSIS — R9439 Abnormal result of other cardiovascular function study: Secondary | ICD-10-CM | POA: Insufficient documentation

## 2019-02-23 DIAGNOSIS — R079 Chest pain, unspecified: Secondary | ICD-10-CM

## 2019-02-23 LAB — NM MYOCAR MULTI W/SPECT W/WALL MOTION / EF
LV dias vol: 130 mL (ref 62–150)
LV sys vol: 50 mL
Peak HR: 106 {beats}/min
RATE: 0.46
Rest HR: 57 {beats}/min
SDS: 2
SRS: 1
SSS: 3
TID: 1.05

## 2019-02-23 MED ORDER — TECHNETIUM TC 99M TETROFOSMIN IV KIT
30.0000 | PACK | Freq: Once | INTRAVENOUS | Status: AC | PRN
  Administered 2019-02-23: 09:00:00 33 via INTRAVENOUS

## 2019-02-23 MED ORDER — REGADENOSON 0.4 MG/5ML IV SOLN
INTRAVENOUS | Status: AC
  Administered 2019-02-23: 0.4 mg via INTRAVENOUS
  Filled 2019-02-23: qty 5

## 2019-02-23 MED ORDER — SODIUM CHLORIDE FLUSH 0.9 % IV SOLN
INTRAVENOUS | Status: AC
  Administered 2019-02-23: 09:00:00 10 mL via INTRAVENOUS
  Filled 2019-02-23: qty 10

## 2019-02-23 MED ORDER — TECHNETIUM TC 99M TETROFOSMIN IV KIT
10.0000 | PACK | Freq: Once | INTRAVENOUS | Status: AC | PRN
  Administered 2019-02-23: 08:00:00 11 via INTRAVENOUS

## 2019-02-24 ENCOUNTER — Telehealth: Payer: Self-pay

## 2019-02-24 NOTE — Telephone Encounter (Signed)
Pt made aware, appointment made.

## 2019-02-24 NOTE — Telephone Encounter (Signed)
-----   Message from Arnoldo Lenis, MD sent at 02/24/2019  3:27 PM EST ----- Stress test suggets a possible moderate area of blockage. Can we give him a virtual appointment with me next week to discuss, there some some slots midweek I believe  Zandra Abts MD

## 2019-02-28 ENCOUNTER — Ambulatory Visit: Payer: BC Managed Care – PPO | Admitting: Family Medicine

## 2019-02-28 ENCOUNTER — Other Ambulatory Visit: Payer: Self-pay

## 2019-02-28 NOTE — Telephone Encounter (Signed)
I think Wed appt is fine, Dr Kirtland Bouchard will actually be back and we can get him added on with him to discuss next steps. How are his symptoms doing, any changes?  Staci, Dr Kirtland Bouchard is cutting his vacation short and working this Wed to Friday. Can we get this patient an appt with Dr Kirtland Bouchard on Wed, I believe he will be doing virtual clinic.     Dominga Ferry MD

## 2019-03-02 ENCOUNTER — Telehealth (INDEPENDENT_AMBULATORY_CARE_PROVIDER_SITE_OTHER): Payer: BC Managed Care – PPO | Admitting: Cardiovascular Disease

## 2019-03-02 ENCOUNTER — Encounter: Payer: Self-pay | Admitting: Cardiology

## 2019-03-02 VITALS — BP 136/79 | HR 69 | Ht 71.0 in | Wt 234.0 lb

## 2019-03-02 DIAGNOSIS — R9439 Abnormal result of other cardiovascular function study: Secondary | ICD-10-CM | POA: Diagnosis not present

## 2019-03-02 DIAGNOSIS — R0789 Other chest pain: Secondary | ICD-10-CM

## 2019-03-02 NOTE — Patient Instructions (Addendum)
Medication Instructions:   Your physician has recommended you make the following change in your medication:   Start aspirin 81 mg by mouth daily  Start metoprolol tartrate 12.5 mg by mouth twice daily  Continue other medications the same  Labwork:  Your physician recommends that you return for non-fasting lab work on Tuesday, March 08, 2019 to have your pre-cath lab work done to check your BMET & CBC-lab orders faxed. After this is done, you will have your covid-19 testing done at the [facility] @1 :00 pm. You will go home and quarantine after this test until your cath is complete.  Testing/Procedures: Your physician has requested that you have a cardiac catheterization. Cardiac catheterization is used to diagnose and/or treat various heart conditions. Doctors may recommend this procedure for a number of different reasons. The most common reason is to evaluate chest pain. Chest pain can be a symptom of coronary artery disease (CAD), and cardiac catheterization can show whether plaque is narrowing or blocking your heart's arteries. This procedure is also used to evaluate the valves, as well as measure the blood flow and oxygen levels in different parts of your heart. For further information please visit . Please follow instruction sheet, as given.  Follow-Up:  Your physician recommends that you schedule a follow-up appointment in: 6 weeks (virtual).  Any Other Special Instructions Will Be Listed Below (If Applicable).  If you need a refill on your cardiac medications before your next appointment, please call your pharmacy.     [facility] CARDIOVASCULAR DIVISION [facility] [address] [place] Dept: [phone] Loc: [phone]  JAMAL PAVON  03/03/2019  You are scheduled for a Cardiac Catheterization on Friday, January 15 with Dr. January 17.  1. Please arrive at the  [facility] (Main Entrance A) at [facility]: [address] [place], [place] at 7:00 AM (This time is two hours before your procedure to ensure your preparation).  Special note: Every effort is made to have your procedure done on time. Please understand that emergencies sometimes delay scheduled procedures.  2. Diet: Do not eat solid foods after midnight.  The patient may have clear liquids until 5am upon the day of the procedure.  3. Labs: You will need to have blood drawn on Tuesday, January 12 at [facility] [place] [address], [place]  Open: 7am - 6pm, Sat 8am - 12 noon   Phone: [phone]. You do not need to be fasting.--Please have done before your covid test  4. Medication instructions in preparation for your procedure:   Contrast Allergy: No  On the morning of your procedure, take your Aspirin 81 mg and any morning medicines NOT listed above.  You may use sips of water.  5. Plan for one night stay--bring personal belongings. 6. Bring a current list of your medications and current insurance cards. 7. You MUST have a responsible person to drive you home. 8. Someone MUST be with you the first 24 hours after you arrive home or your discharge will be delayed. 9. Please wear clothes that are easy to get on and off and wear slip-on shoes.  Thank you for allowing [phone] to care for you!   -- [facility]

## 2019-03-02 NOTE — Progress Notes (Signed)
erroneous

## 2019-03-02 NOTE — H&P (View-Only) (Signed)
Virtual Visit via Telephone Note   This visit type was conducted due to national recommendations for restrictions regarding the COVID-19 Pandemic (e.g. social distancing) in an effort to limit this patient's exposure and mitigate transmission in our community.  Due to his co-morbid illnesses, this patient is at least at moderate risk for complications without adequate follow up.  This format is felt to be most appropriate for this patient at this time.  The patient did not have access to video technology/had technical difficulties with video requiring transitioning to audio format only (telephone).  All issues noted in this document were discussed and addressed.  No physical exam could be performed with this format.  Please refer to the patient's chart for his  consent to telehealth for [facility].   Date:  03/02/2019   ID:  [name], DOB 1965-07-05, MRN [id number]  Patient Location: Home Provider Location: Home  PCP:  Kathyrn Drown, MD  Cardiologist:  Kate Sable, MD  Electrophysiologist:  None   Evaluation Performed:  Follow-Up Visit  Chief Complaint:  Chest pain  History of Present Illness:    Justin Estrada is a 54 y.o. male with chest pain.  I last saw him in July 2019 at which time he was doing well.  He underwent a negative stress echocardiogram for inducible ischemia in July 2019.  He was then evaluated in our office on 02/14/2019 by R. Barrett PA-C for what was felt to be somewhat atypical chest pain.  He may need a cholecystectomy and thus a Lexiscan Myoview was ordered.  Nuclear stress test on 02/23/2019 demonstrated a moderate sized prior inferior myocardial infarction with moderate peri-infarct ischemia.  It was deemed an intermediate risk study.  LVEF was normal, 55 to 65%.  He underwent a normal HIDA scan on 02/21/2019.  He told me symptoms began after he and his son were lifting the front end of a riding mower.  Since that time he has had sporadic chest  pains on both the left and right side of his chest.  Symptoms last for 1 or 2 seconds and then spontaneously resolved.  They can occur on the left side of his chest and in the left axilla and in the left inframammary region.  They can also occur on the right pectoral side of his chest as well.  He took down Christmas decorations over the weekend and did other yard work without any exertional chest pain, dyspnea, or fatigue.    Social history: Both of his parents were my patients. They are deceased. His older brother Harrie Jeans used to come to those appointments with his parents. 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  Past Medical History:  Diagnosis Date  . Anal fissure   . Chest pain, atypical 08/2017   Normal stress echo   Past Surgical History:  Procedure Laterality Date  . BIOPSY  10/12/2017   Procedure: BIOPSY;  Surgeon: Rogene Houston, MD;  [facility];  Service: Endoscopy;;  antral biopsy   . COLONOSCOPY     10 plus years ago by Dr.Rourk  . COLONOSCOPY N/A 07/28/2016   Procedure: COLONOSCOPY;  Surgeon: Rogene Houston, MD;  [facility];  Service: Endoscopy;  Laterality: N/A;  730-rescheduled to 6/4 same time per Lelon Frohlich  . ESOPHAGOGASTRODUODENOSCOPY N/A 10/12/2017   Procedure: ESOPHAGOGASTRODUODENOSCOPY (EGD);  Surgeon: Rogene Houston, MD;  [facility];  Service: Endoscopy;  Laterality:  N/A;  730  . EXCISION OF SKIN TAG N/A 09/30/2017   Procedure: EXCISION OF PERIANAL SKIN TAGS;  Surgeon: Lucretia Roers, MD;  Location: [facility];  Service: General;  Laterality: N/A;  . foot surgery  for bone spurs Bilateral   . UPPER GASTROINTESTINAL ENDOSCOPY     10 plus years ago by Dr.Rourk     Current Meds  Medication Sig  . dexlansoprazole (DEXILANT) 60 MG capsule Take 1 capsule (60 mg total) by mouth daily.  Marland Kitchen diltiazem 2 % GEL Apply 1 application topically at bedtime as needed (anal  fissure). Use as directed.  . docusate sodium (COLACE) 100 MG capsule Take 1 capsule (100 mg total) by mouth 2 (two) times daily.  . psyllium (METAMUCIL SMOOTH TEXTURE) 28 % packet Take 1 packet by mouth at bedtime.     Allergies:   Patient has no known allergies.   Social History   Tobacco Use  . Smoking status: Never Smoker  . Smokeless tobacco: Never Used  Substance Use Topics  . Alcohol use: No    Alcohol/week: 0.0 standard drinks  . Drug use: No     Family Hx: The patient's family history includes Atrial fibrillation in his brother; Congestive Heart Failure in his father; Hypertension in his father and mother; Liver cancer in his mother; Lung cancer in his mother.  ROS:   Please see the history of present illness.     All other systems reviewed and are negative.   Prior CV studies:   The following studies were reviewed today:  Reviewed above  Labs/Other Tests and Data Reviewed:    EKG:  No ECG reviewed.  Recent Labs: 05/01/2018: ALT 18; BUN 12; Creatinine, Ser 1.21; Potassium 5.1; Sodium 148   Recent Lipid Panel Lab Results  Component Value Date/Time   CHOL 139 05/01/2018 09:10 AM   TRIG 110 05/01/2018 09:10 AM   HDL 34 (L) 05/01/2018 09:10 AM   CHOLHDL 4.1 05/01/2018 09:10 AM   LDLCALC 83 05/01/2018 09:10 AM    Wt Readings from Last 3 Encounters:  03/02/19 234 lb (106.1 kg)  02/14/19 235 lb (106.6 kg)  02/01/19 243 lb (110.2 kg)     Objective:    Vital Signs:  BP 136/79   Pulse 69   Ht 5\' 11"  (1.803 m)   Wt 234 lb (106.1 kg)   BMI 32.64 kg/m    VITAL SIGNS:  reviewed  ASSESSMENT & PLAN:    1.  Abnormal nuclear stress test/chest pain: Nuclear stress test reviewed above with a moderate sized prior inferior myocardial infarction with moderate peri-infarct ischemia and normal LV systolic function.  We had a lengthy discussion about options including initial medical management as symptoms indeed appear to be atypical in nature and are not provoked  with exertion.  I will start aspirin 81 mg daily and metoprolol tartrate 12.5 mg twice daily.  He would prefer to undergo cardiac catheterization sooner than later which I will arrange. Risks and benefits of cardiac catheterization have been discussed with the patient.  These include bleeding, infection, kidney damage, stroke, heart attack, death.  The patient understands these risks and is willing to proceed.     COVID-19 Education: The signs and symptoms of COVID-19 were discussed with the patient and how to seek care for testing (follow up with PCP or arrange E-visit).  The importance of social distancing was discussed today.  Time:   Today, I have spent 25 minutes with the patient with telehealth technology  discussing the above problems.     Medication Adjustments/Labs and Tests Ordered: Current medicines are reviewed at length with the patient today.  Concerns regarding medicines are outlined above.   Tests Ordered: No orders of the defined types were placed in this encounter.   Medication Changes: No orders of the defined types were placed in this encounter.   Follow Up:  Virtual Visit  in 6 week(s)  Signed, Prentice Docker, MD  03/02/2019 4:23 PM    [facility]

## 2019-03-02 NOTE — Progress Notes (Signed)
Virtual Visit via Telephone Note   This visit type was conducted due to national recommendations for restrictions regarding the COVID-19 Pandemic (e.g. social distancing) in an effort to limit this patient's exposure and mitigate transmission in our community.  Due to his co-morbid illnesses, this patient is at least at moderate risk for complications without adequate follow up.  This format is felt to be most appropriate for this patient at this time.  The patient did not have access to video technology/had technical difficulties with video requiring transitioning to audio format only (telephone).  All issues noted in this document were discussed and addressed.  No physical exam could be performed with this format.  Please refer to the patient's chart for his  consent to telehealth for [facility].   Date:  03/02/2019   ID:  Justin Estrada, DOB 1965-07-05, MRN [id number]  Patient Location: Home Provider Location: Home  PCP:  Kathyrn Drown, MD  Cardiologist:  Kate Sable, MD  Electrophysiologist:  None   Evaluation Performed:  Follow-Up Visit  Chief Complaint:  Chest pain  History of Present Illness:    Justin Estrada is a 54 y.o. male with chest pain.  I last saw him in July 2019 at which time he was doing well.  He underwent a negative stress echocardiogram for inducible ischemia in July 2019.  He was then evaluated in our office on 02/14/2019 by R. Barrett PA-C for what was felt to be somewhat atypical chest pain.  He may need a cholecystectomy and thus a Lexiscan Myoview was ordered.  Nuclear stress test on 02/23/2019 demonstrated a moderate sized prior inferior myocardial infarction with moderate peri-infarct ischemia.  It was deemed an intermediate risk study.  LVEF was normal, 55 to 65%.  He underwent a normal HIDA scan on 02/21/2019.  He told me symptoms began after he and his son were lifting the front end of a riding mower.  Since that time he has had sporadic chest  pains on both the left and right side of his chest.  Symptoms last for 1 or 2 seconds and then spontaneously resolved.  They can occur on the left side of his chest and in the left axilla and in the left inframammary region.  They can also occur on the right pectoral side of his chest as well.  He took down Christmas decorations over the weekend and did other yard work without any exertional chest pain, dyspnea, or fatigue.    Social history: Both of his parents were my patients. They are deceased. His older brother Harrie Jeans used to come to those appointments with his parents. 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  Past Medical History:  Diagnosis Date  . Anal fissure   . Chest pain, atypical 08/2017   Normal stress echo   Past Surgical History:  Procedure Laterality Date  . BIOPSY  10/12/2017   Procedure: BIOPSY;  Surgeon: Rogene Houston, MD;  Location: [facility];  Service: Endoscopy;;  antral biopsy   . COLONOSCOPY     10 plus years ago by Dr.Rourk  . COLONOSCOPY N/A 07/28/2016   Procedure: COLONOSCOPY;  Surgeon: Rogene Houston, MD;  Location: [facility];  Service: Endoscopy;  Laterality: N/A;  730-rescheduled to 6/4 same time per Lelon Frohlich  . ESOPHAGOGASTRODUODENOSCOPY N/A 10/12/2017   Procedure: ESOPHAGOGASTRODUODENOSCOPY (EGD);  Surgeon: Rogene Houston, MD;  Location: [facility];  Service: Endoscopy;  Laterality:  N/A;  730  . EXCISION OF SKIN TAG N/A 09/30/2017   Procedure: EXCISION OF PERIANAL SKIN TAGS;  Surgeon: Bridges, Lindsay C, MD;  Location: [facility] ORS;  Service: General;  Laterality: N/A;  . foot surgery  for bone spurs Bilateral   . UPPER GASTROINTESTINAL ENDOSCOPY     10 plus years ago by Dr.Rourk     Current Meds  Medication Sig  . dexlansoprazole (DEXILANT) 60 MG capsule Take 1 capsule (60 mg total) by mouth daily.  . diltiazem 2 % GEL Apply 1 application topically at bedtime as needed (anal  fissure). Use as directed.  . docusate sodium (COLACE) 100 MG capsule Take 1 capsule (100 mg total) by mouth 2 (two) times daily.  . psyllium (METAMUCIL SMOOTH TEXTURE) 28 % packet Take 1 packet by mouth at bedtime.     Allergies:   Patient has no known allergies.   Social History   Tobacco Use  . Smoking status: Never Smoker  . Smokeless tobacco: Never Used  Substance Use Topics  . Alcohol use: No    Alcohol/week: 0.0 standard drinks  . Drug use: No     Family Hx: The patient's family history includes Atrial fibrillation in his brother; Congestive Heart Failure in his father; Hypertension in his father and mother; Liver cancer in his mother; Lung cancer in his mother.  ROS:   Please see the history of present illness.     All other systems reviewed and are negative.   Prior CV studies:   The following studies were reviewed today:  Reviewed above  Labs/Other Tests and Data Reviewed:    EKG:  No ECG reviewed.  Recent Labs: 05/01/2018: ALT 18; BUN 12; Creatinine, Ser 1.21; Potassium 5.1; Sodium 148   Recent Lipid Panel Lab Results  Component Value Date/Time   CHOL 139 05/01/2018 09:10 AM   TRIG 110 05/01/2018 09:10 AM   HDL 34 (L) 05/01/2018 09:10 AM   CHOLHDL 4.1 05/01/2018 09:10 AM   LDLCALC 83 05/01/2018 09:10 AM    Wt Readings from Last 3 Encounters:  03/02/19 234 lb (106.1 kg)  02/14/19 235 lb (106.6 kg)  02/01/19 243 lb (110.2 kg)     Objective:    Vital Signs:  BP 136/79   Pulse 69   Ht 5' 11" (1.803 m)   Wt 234 lb (106.1 kg)   BMI 32.64 kg/m    VITAL SIGNS:  reviewed  ASSESSMENT & PLAN:    1.  Abnormal nuclear stress test/chest pain: Nuclear stress test reviewed above with a moderate sized prior inferior myocardial infarction with moderate peri-infarct ischemia and normal LV systolic function.  We had a lengthy discussion about options including initial medical management as symptoms indeed appear to be atypical in nature and are not provoked  with exertion.  I will start aspirin 81 mg daily and metoprolol tartrate 12.5 mg twice daily.  He would prefer to undergo cardiac catheterization sooner than later which I will arrange. Risks and benefits of cardiac catheterization have been discussed with the patient.  These include bleeding, infection, kidney damage, stroke, heart attack, death.  The patient understands these risks and is willing to proceed.     COVID-19 Education: The signs and symptoms of COVID-19 were discussed with the patient and how to seek care for testing (follow up with PCP or arrange E-visit).  The importance of social distancing was discussed today.  Time:   Today, I have spent 25 minutes with the patient with telehealth technology   discussing the above problems.     Medication Adjustments/Labs and Tests Ordered: Current medicines are reviewed at length with the patient today.  Concerns regarding medicines are outlined above.   Tests Ordered: No orders of the defined types were placed in this encounter.   Medication Changes: No orders of the defined types were placed in this encounter.   Follow Up:  Virtual Visit  in 6 week(s)  Signed, Prentice Docker, MD  03/02/2019 4:23 PM    [facility]

## 2019-03-03 ENCOUNTER — Telehealth: Payer: Self-pay | Admitting: Cardiovascular Disease

## 2019-03-03 ENCOUNTER — Other Ambulatory Visit: Payer: Self-pay | Admitting: Cardiovascular Disease

## 2019-03-03 DIAGNOSIS — R9439 Abnormal result of other cardiovascular function study: Secondary | ICD-10-CM

## 2019-03-03 MED ORDER — SODIUM CHLORIDE 0.9% FLUSH: 3.0000 mL | Freq: Two times a day (BID) | INTRAVENOUS | Status: DC

## 2019-03-03 MED ORDER — METOPROLOL TARTRATE 25 MG PO TABS: 12.5000 mg | ORAL_TABLET | Freq: Two times a day (BID) | ORAL | 3 refills | Status: DC

## 2019-03-03 MED ORDER — ASPIRIN EC 81 MG PO TBEC: 81.0000 mg | DELAYED_RELEASE_TABLET | Freq: Every day | ORAL | 3 refills | Status: DC

## 2019-03-03 NOTE — Telephone Encounter (Signed)
Left heart cath Friday, March 11, 2019 @9 :00 am with Dr. 01 dx: abnormal nuclear stress test

## 2019-03-03 NOTE — Telephone Encounter (Signed)
Heart Cath instructions in addition to lab work and covid testing discussed with patient in its entirety. Verbalized understanding of plan.

## 2019-03-03 NOTE — Addendum Note (Signed)
Addended by: Eustace Moore on: 03/03/2019 08:48 AM   Modules accepted: Orders

## 2019-03-08 ENCOUNTER — Telehealth: Payer: Self-pay | Admitting: *Deleted

## 2019-03-08 ENCOUNTER — Other Ambulatory Visit (HOSPITAL_COMMUNITY): Payer: BC Managed Care – PPO

## 2019-03-08 ENCOUNTER — Other Ambulatory Visit: Payer: Self-pay

## 2019-03-08 ENCOUNTER — Other Ambulatory Visit (HOSPITAL_COMMUNITY)
Admission: RE | Admit: 2019-03-08 | Discharge: 2019-03-08 | Disposition: A | Discharge status: Home / Self Care | Payer: BC Managed Care – PPO | Source: Ambulatory Visit | Attending: Cardiovascular Disease | Admitting: Cardiovascular Disease

## 2019-03-08 DIAGNOSIS — Z20822 Contact with and (suspected) exposure to covid-19: Secondary | ICD-10-CM | POA: Diagnosis not present

## 2019-03-08 DIAGNOSIS — Z01812 Encounter for preprocedural laboratory examination: Secondary | ICD-10-CM | POA: Insufficient documentation

## 2019-03-08 LAB — CBC
HCT: 46.5 % (ref 38.5–50.0)
Hemoglobin: 15.8 g/dL (ref 13.2–17.1)
MCH: 30.2 pg (ref 27.0–33.0)
MCHC: 34 g/dL (ref 32.0–36.0)
MCV: 88.7 fL (ref 80.0–100.0)
MPV: 9.1 fL (ref 7.5–12.5)
Platelets: 312 10*3/uL (ref 140–400)
RBC: 5.24 10*6/uL (ref 4.20–5.80)
RDW: 12.2 % (ref 11.0–15.0)
WBC: 6.2 10*3/uL (ref 3.8–10.8)

## 2019-03-08 LAB — BASIC METABOLIC PANEL
BUN: 13 mg/dL (ref 7–25)
CO2: 29 mmol/L (ref 20–32)
Calcium: 9.5 mg/dL (ref 8.6–10.3)
Chloride: 106 mmol/L (ref 98–110)
Creat: 1.27 mg/dL (ref 0.70–1.33)
Glucose, Bld: 99 mg/dL (ref 65–99)
Potassium: 5 mmol/L (ref 3.5–5.3)
Sodium: 142 mmol/L (ref 135–146)

## 2019-03-08 LAB — SARS CORONAVIRUS 2 (TAT 6-24 HRS): SARS Coronavirus 2: NEGATIVE

## 2019-03-08 NOTE — Telephone Encounter (Signed)
-----   Message from Laqueta Linden, MD sent at 03/08/2019  2:28 PM EST ----- Normal

## 2019-03-08 NOTE — Telephone Encounter (Signed)
Pt aware - routed to pcp  

## 2019-03-10 ENCOUNTER — Telehealth: Payer: Self-pay | Admitting: *Deleted

## 2019-03-10 NOTE — H&P (Signed)
Nuclear stress test with abnormal inferoapical perfusion. Atypical symptoms.

## 2019-03-10 NOTE — Telephone Encounter (Addendum)
Pt contacted pre-catheterization scheduled at [facility] for: Friday March 11, 2019 9 AM Verified arrival time and place: [facility] Main Entr[facility]) at: 7 AM   No solid food after midnight prior to cath, clear liquids until 5 AM day of procedure. Contrast allergy: no   AM meds can be  taken pre-cath with sip of water including: ASA 81 mg   Confirmed patient has responsible adult to drive home post procedure and observe 24 hours after arriving home: yes  Currently, due to Covid-19 pandemic, only one support person will be allowed with patient. Must be the same support person for that patient's entire stay, will be screened and required to wear a mask. They will be asked to wait in the waiting room for the duration of the patient's stay.  Patients are required to wear a mask when they enter the hospital.     COVID-19 Pre-Screening Questions:  . In the past 7 to 10 days have you had a cough,  shortness of breath, headache, congestion, fever (100 or greater) body aches, chills, sore throat, or sudden loss of taste or sense of smell? no . Have you been around anyone with known Covid 19? no . Have you been around anyone who is awaiting Covid 19 test results in the past 7 to 10 days? no . Have you been around anyone who has been exposed to Covid 19, or has mentioned symptoms of Covid 19 within the past 7 to 10 days? No  I reviewed procedure/mask/visitor instructions, Covid-19 screening questions with patient, he verbalized understanding, thanked me for call.

## 2019-03-11 ENCOUNTER — Other Ambulatory Visit: Payer: Self-pay

## 2019-03-11 ENCOUNTER — Encounter (HOSPITAL_COMMUNITY)
Admission: RE | Disposition: A | Discharge status: Home / Self Care | Payer: Self-pay | Source: Ambulatory Visit | Attending: Interventional Cardiology

## 2019-03-11 ENCOUNTER — Ambulatory Visit (HOSPITAL_COMMUNITY)
Admission: RE | Admit: 2019-03-11 | Discharge: 2019-03-11 | Disposition: A | Discharge status: Home / Self Care | Payer: BC Managed Care – PPO | Source: Ambulatory Visit | Attending: Interventional Cardiology | Admitting: Interventional Cardiology

## 2019-03-11 DIAGNOSIS — Z79899 Other long term (current) drug therapy: Secondary | ICD-10-CM | POA: Insufficient documentation

## 2019-03-11 DIAGNOSIS — R9439 Abnormal result of other cardiovascular function study: Secondary | ICD-10-CM

## 2019-03-11 DIAGNOSIS — I252 Old myocardial infarction: Secondary | ICD-10-CM | POA: Insufficient documentation

## 2019-03-11 DIAGNOSIS — R0789 Other chest pain: Secondary | ICD-10-CM | POA: Diagnosis not present

## 2019-03-11 HISTORY — PX: LEFT HEART CATH AND CORONARY ANGIOGRAPHY: CATH118249

## 2019-03-11 SURGERY — LEFT HEART CATH AND CORONARY ANGIOGRAPHY
Anesthesia: LOCAL

## 2019-03-11 MED ORDER — DILTIAZEM GEL 2 %: 1.0000 "application " | Freq: Every evening | CUTANEOUS | Status: DC | PRN

## 2019-03-11 MED ORDER — HEPARIN (PORCINE) IN NACL 1000-0.9 UT/500ML-% IV SOLN
INTRAVENOUS | Status: AC
  Filled 2019-03-11: qty 500

## 2019-03-11 MED ORDER — SODIUM CHLORIDE 0.9% FLUSH: 3.0000 mL | Freq: Two times a day (BID) | INTRAVENOUS | Status: DC

## 2019-03-11 MED ORDER — FAMOTIDINE 20 MG PO TABS: 10.0000 mg | ORAL_TABLET | Freq: Every day | ORAL | Status: DC | PRN

## 2019-03-11 MED ORDER — FENTANYL CITRATE (PF) 100 MCG/2ML IJ SOLN
INTRAMUSCULAR | Status: DC | PRN
  Administered 2019-03-11: 25 ug via INTRAVENOUS

## 2019-03-11 MED ORDER — HEPARIN SODIUM (PORCINE) 1000 UNIT/ML IJ SOLN
INTRAMUSCULAR | Status: AC
  Filled 2019-03-11: qty 1

## 2019-03-11 MED ORDER — PSYLLIUM 95 % PO PACK
1.0000 | PACK | Freq: Every day | ORAL | Status: DC
  Filled 2019-03-11: qty 1

## 2019-03-11 MED ORDER — LIDOCAINE HCL (PF) 1 % IJ SOLN
INTRAMUSCULAR | Status: DC | PRN
  Administered 2019-03-11: 2 mL

## 2019-03-11 MED ORDER — SODIUM CHLORIDE 0.9% FLUSH: 3.0000 mL | INTRAVENOUS | Status: DC | PRN

## 2019-03-11 MED ORDER — MIDAZOLAM HCL 2 MG/2ML IJ SOLN
INTRAMUSCULAR | Status: DC | PRN
  Administered 2019-03-11: 1 mg via INTRAVENOUS

## 2019-03-11 MED ORDER — ASPIRIN EC 81 MG PO TBEC: 81.0000 mg | DELAYED_RELEASE_TABLET | Freq: Every day | ORAL | Status: DC

## 2019-03-11 MED ORDER — SODIUM CHLORIDE 0.9 % IV SOLN: 250.0000 mL | INTRAVENOUS | Status: DC | PRN

## 2019-03-11 MED ORDER — SODIUM CHLORIDE 0.9 % WEIGHT BASED INFUSION: 1.0000 mL/kg/h | INTRAVENOUS | Status: DC

## 2019-03-11 MED ORDER — SODIUM CHLORIDE 0.9 % IV SOLN: INTRAVENOUS | Status: DC

## 2019-03-11 MED ORDER — FENTANYL CITRATE (PF) 100 MCG/2ML IJ SOLN
INTRAMUSCULAR | Status: AC
  Filled 2019-03-11: qty 2

## 2019-03-11 MED ORDER — ACETAMINOPHEN 325 MG PO TABS: 650.0000 mg | ORAL_TABLET | ORAL | Status: DC | PRN

## 2019-03-11 MED ORDER — MIDAZOLAM HCL 2 MG/2ML IJ SOLN
INTRAMUSCULAR | Status: AC
  Filled 2019-03-11: qty 2

## 2019-03-11 MED ORDER — ONDANSETRON HCL 4 MG/2ML IJ SOLN: 4.0000 mg | Freq: Four times a day (QID) | INTRAMUSCULAR | Status: DC | PRN

## 2019-03-11 MED ORDER — ASPIRIN 81 MG PO CHEW: 81.0000 mg | CHEWABLE_TABLET | ORAL | Status: DC

## 2019-03-11 MED ORDER — VERAPAMIL HCL 2.5 MG/ML IV SOLN
INTRAVENOUS | Status: AC
  Filled 2019-03-11: qty 2

## 2019-03-11 MED ORDER — DOCUSATE SODIUM 100 MG PO CAPS
100.0000 mg | ORAL_CAPSULE | Freq: Every day | ORAL | Status: DC
  Filled 2019-03-11: qty 1

## 2019-03-11 MED ORDER — LIDOCAINE HCL (PF) 1 % IJ SOLN
INTRAMUSCULAR | Status: AC
  Filled 2019-03-11: qty 30

## 2019-03-11 MED ORDER — HEPARIN (PORCINE) IN NACL 1000-0.9 UT/500ML-% IV SOLN
INTRAVENOUS | Status: AC
  Filled 2019-03-11: qty 1000

## 2019-03-11 MED ORDER — HEPARIN (PORCINE) IN NACL 1000-0.9 UT/500ML-% IV SOLN
INTRAVENOUS | Status: DC | PRN
  Administered 2019-03-11 (×2): 500 mL

## 2019-03-11 MED ORDER — METOPROLOL TARTRATE 12.5 MG HALF TABLET: 12.5000 mg | ORAL_TABLET | Freq: Two times a day (BID) | ORAL | Status: DC

## 2019-03-11 MED ORDER — LABETALOL HCL 5 MG/ML IV SOLN: 10.0000 mg | INTRAVENOUS | Status: DC | PRN

## 2019-03-11 MED ORDER — HEPARIN SODIUM (PORCINE) 1000 UNIT/ML IJ SOLN
INTRAMUSCULAR | Status: DC | PRN
  Administered 2019-03-11: 5000 [IU] via INTRAVENOUS

## 2019-03-11 MED ORDER — HYDRALAZINE HCL 20 MG/ML IJ SOLN: 10.0000 mg | INTRAMUSCULAR | Status: DC | PRN

## 2019-03-11 MED ORDER — SODIUM CHLORIDE 0.9 % WEIGHT BASED INFUSION
3.0000 mL/kg/h | INTRAVENOUS | Status: AC
  Administered 2019-03-11: 08:00:00 3 mL/kg/h via INTRAVENOUS

## 2019-03-11 MED ORDER — VERAPAMIL HCL 2.5 MG/ML IV SOLN
INTRAVENOUS | Status: DC | PRN
  Administered 2019-03-11: 10 mL via INTRA_ARTERIAL

## 2019-03-11 MED ORDER — IOHEXOL 350 MG/ML SOLN
INTRAVENOUS | Status: DC | PRN
  Administered 2019-03-11: 75 mL

## 2019-03-11 SURGICAL SUPPLY — 10 items
CATH 5FR JL3.5 JR4 ANG PIG MP (CATHETERS) ×1 IMPLANT
DEVICE RAD COMP TR BAND LRG (VASCULAR PRODUCTS) ×1 IMPLANT
GLIDESHEATH SLEND A-KIT 6F 22G (SHEATH) ×1 IMPLANT
GUIDEWIRE INQWIRE 1.5J.035X260 (WIRE) IMPLANT
INQWIRE 1.5J .035X260CM (WIRE) ×2
KIT HEART LEFT (KITS) ×2 IMPLANT
PACK CARDIAC CATHETERIZATION (CUSTOM PROCEDURE TRAY) ×2 IMPLANT
SHEATH PROBE COVER 6X72 (BAG) ×1 IMPLANT
TRANSDUCER W/STOPCOCK (MISCELLANEOUS) ×2 IMPLANT
TUBING CIL FLEX 10 FLL-RA (TUBING) ×2 IMPLANT

## 2019-03-11 NOTE — Discharge Instructions (Signed)
Radial Site Care  This sheet gives you information about how to care for yourself after your procedure. Your health care provider may also give you more specific instructions. If you have problems or questions, contact your health care provider. What can I expect after the procedure? After the procedure, it is common to have:  Bruising and tenderness at the catheter insertion area. Follow these instructions at home: Medicines  Take over-the-counter and prescription medicines only as told by your health care provider. Insertion site care  Follow instructions from your health care provider about how to take care of your insertion site. Make sure you: ? Wash your hands with soap and water before you change your bandage (dressing). If soap and water are not available, use hand sanitizer. ? Change your dressing as told by your health care provider. ? Leave stitches (sutures), skin glue, or adhesive strips in place. These skin closures may need to stay in place for 2 weeks or longer. If adhesive strip edges start to loosen and curl up, you may trim the loose edges. Do not remove adhesive strips completely unless your health care provider tells you to do that.  Check your insertion site every day for signs of infection. Check for: ? Redness, swelling, or pain. ? Fluid or blood. ? Pus or a bad smell. ? Warmth.  Do not take baths, swim, or use a hot tub until your health care provider approves.  You may shower 24 hours after the procedure, or as directed by your health care provider. ? Remove the dressing and gently wash the site with plain soap and water. ? Pat the area dry with a clean towel. ? Do not rub the site. That could cause bleeding.  Do not apply powder or lotion to the site. Activity   For 24 hours after the procedure, or as directed by your health care provider: ? Do not flex or bend the affected arm. ? Do not push or pull heavy objects with the affected arm. ? Do not drive  yourself home from the hospital or clinic. You may drive 24 hours after the procedure unless your health care provider tells you not to. ? Do not operate machinery or power tools.  Do not lift anything that is heavier than 10 lb (4.5 kg), for 5 days.  Ask your health care provider when it is okay to: ? Return to work or school. ? Resume usual physical activities or sports. ? Resume sexual activity. General instructions  If the catheter site starts to bleed, raise your arm and put firm pressure on the site. If the bleeding does not stop, get help right away. This is a medical emergency.  If you went home on the same day as your procedure, a responsible adult should be with you for the first 24 hours after you arrive home.  Keep all follow-up visits as told by your health care provider. This is important. Contact a health care provider if:  You have a fever.  You have redness, swelling, or yellow drainage around your insertion site. Get help right away if:  You have unusual pain at the radial site.  The catheter insertion area swells very fast.  The insertion area is bleeding, and the bleeding does not stop when you hold steady pressure on the area.  Your arm or hand becomes pale, cool, tingly, or numb. These symptoms may represent a serious problem that is an emergency. Do not wait to see if the symptoms will go  away. Get medical help right away. Call your local emergency services (911 in the [place] Do not drive yourself to the hospital. Summary  After the procedure, it is common to have bruising and tenderness at the site.  Follow instructions from your health care provider about how to take care of your radial site wound. Check the wound every day for signs of infection.  Do not lift anything that is heavier than 10 lb (4.5 kg), or the limit that you are told, until your health care provider says that it is safe. This information is not intended to replace advice given to you  by your health care provider. Make sure you discuss any questions you have with your health care provider. Document Revised: 03/18/2017 Document Reviewed: 03/18/2017 Elsevier Patient Education  2020 [employer].

## 2019-03-11 NOTE — CV Procedure (Signed)
   Left heart cath with coronary angiography and LV hemodynamics/hand-injection via right radial using real-time vascular ultrasound.  Normal coronary arteries.  Normal LV function and hemodynamics.

## 2019-03-11 NOTE — Interval H&P Note (Signed)
  Clinical Evaluation Leading to the Procedure:   ACS: No.  Non-ACS:    Anginal Classification: CCS II  Anti-ischemic medical therapy: Minimal Therapy (1 class of medications)  Non-Invasive Test Results: Intermediate-risk stress test findings: cardiac mortality 1-3%/year  Prior CABG: No previous CABG            History and Physical Interval Note:  03/11/2019 8:20 AM  Justin Estrada  has presented today for surgery, with the diagnosis of abnormal nuc stress test.  The various methods of treatment have been discussed with the patient and family. After consideration of risks, benefits and other options for treatment, the patient has consented to  Procedure(s): LEFT HEART CATH AND CORONARY ANGIOGRAPHY (N/A) as a surgical intervention.  The patient's history has been reviewed, patient examined, no change in status, stable for surgery.  I have reviewed the patient's chart and labs.  Questions were answered to the patient's satisfaction.     Lyn Records III

## 2019-03-16 ENCOUNTER — Encounter: Payer: Self-pay | Admitting: Family Medicine

## 2019-03-17 NOTE — Telephone Encounter (Signed)
Please connect with patient Patient may have in person visit Set up at their convenience anytime next week or in early February thank you

## 2019-03-23 ENCOUNTER — Ambulatory Visit: Payer: BC Managed Care – PPO | Admitting: Family Medicine

## 2019-03-23 ENCOUNTER — Other Ambulatory Visit: Payer: Self-pay

## 2019-03-23 ENCOUNTER — Encounter: Payer: Self-pay | Admitting: Family Medicine

## 2019-03-23 VITALS — BP 128/82 | Temp 97.6°F | Ht 71.0 in | Wt 245.8 lb

## 2019-03-23 DIAGNOSIS — R0789 Other chest pain: Secondary | ICD-10-CM

## 2019-03-23 MED ORDER — MELOXICAM 15 MG PO TABS: 15.0000 mg | ORAL_TABLET | Freq: Every day | ORAL | 0 refills | Status: DC

## 2019-03-23 NOTE — Progress Notes (Signed)
   Subjective:    Patient ID: Justin Estrada, male    DOB: 24-Feb-1966, 54 y.o.   MRN: [id number]  HPI Very nice patient here today for follow-up Patient arrives to follow up on chest pain. Patient has see cardiology and had a work up and would like to discuss it. Patient with intermittent sharp chest pains on the left pectoral region comes and goes intermittently.  Not severe enough to cause any shortness of breath or chest tightness.  Patient has had cardiac catheterization which was negative.  Patient also had chest x-ray which looks good also had ultrasound and HIDA test which did not show gallbladder issues patient has tried some anti-inflammatories without much success All of this started off when he lifted a heavy lawnmower several months ago denies any other particular troubles. Patient also with intermittent right-sided neck pain has seen chiropractor for adjustments which helped some denies radiation down the arm Patient does have twitching of the right eye intermittently but not frequent Review of Systems  Constitutional: Negative for diaphoresis and fatigue.  HENT: Negative for congestion and rhinorrhea.   Respiratory: Negative for cough and shortness of breath.   Cardiovascular: Negative for chest pain and leg swelling.  Gastrointestinal: Negative for abdominal pain and diarrhea.  Skin: Negative for color change and rash.  Neurological: Negative for dizziness and headaches.  Psychiatric/Behavioral: Negative for behavioral problems and confusion.  Chest wall pain left side     Objective:   Physical Exam Vitals reviewed.  Constitutional:      General: He is not in acute distress. HENT:     Head: Normocephalic and atraumatic.  Eyes:     General:        Right eye: No discharge.        Left eye: No discharge.  Neck:     Trachea: No tracheal deviation.  Cardiovascular:     Rate and Rhythm: Normal rate and regular rhythm.     Heart sounds: Normal heart sounds. No murmur.    Pulmonary:     Effort: Pulmonary effort is normal. No respiratory distress.     Breath sounds: Normal breath sounds.  Lymphadenopathy:     Cervical: No cervical adenopathy.  Skin:    General: Skin is warm and dry.  Neurological:     Mental Status: He is alert.     Coordination: Coordination normal.  Psychiatric:        Behavior: Behavior normal.    No twitching of the eye on today's exam Chest wall nontender on left side No adenopathy in the neck patient with tenderness in the paraspinal muscles on the right side     Assessment & Plan:  It is possible that the neck discomfort is mild arthralgias stretching exercises recommended information sheet given  Musculoskeletal left pectoralis chest pain anti-inflammatory prescribed Mobic over the course of the next 2 to 3 weeks not meant for long-term use  I do believe he would benefit from physical therapy evaluation and treatment to see if this might help his discomfort I would not recommend CT scan  Twitching of the eyes could be stress could be fatigue if it becomes progressive and persistent may need further evaluation

## 2019-03-30 ENCOUNTER — Encounter (INDEPENDENT_AMBULATORY_CARE_PROVIDER_SITE_OTHER): Payer: Self-pay

## 2019-03-31 ENCOUNTER — Telehealth (INDEPENDENT_AMBULATORY_CARE_PROVIDER_SITE_OTHER): Payer: Self-pay | Admitting: *Deleted

## 2019-03-31 NOTE — Telephone Encounter (Signed)
Patient's question answered.  

## 2019-04-06 ENCOUNTER — Encounter: Payer: Self-pay | Admitting: Family Medicine

## 2019-04-13 ENCOUNTER — Telehealth: Payer: BC Managed Care – PPO | Admitting: Cardiovascular Disease

## 2019-04-17 ENCOUNTER — Encounter: Payer: Self-pay | Admitting: Family Medicine

## 2019-04-22 ENCOUNTER — Ambulatory Visit: Payer: BC Managed Care – PPO | Admitting: Family Medicine

## 2019-04-22 ENCOUNTER — Ambulatory Visit (HOSPITAL_COMMUNITY)
Admission: RE | Admit: 2019-04-22 | Discharge: 2019-04-22 | Disposition: A | Discharge status: Home / Self Care | Payer: BC Managed Care – PPO | Source: Ambulatory Visit | Attending: Family Medicine | Admitting: Family Medicine

## 2019-04-22 ENCOUNTER — Encounter: Payer: Self-pay | Admitting: Family Medicine

## 2019-04-22 ENCOUNTER — Encounter (HOSPITAL_COMMUNITY): Payer: Self-pay

## 2019-04-22 ENCOUNTER — Other Ambulatory Visit: Payer: Self-pay

## 2019-04-22 VITALS — BP 140/86 | Temp 96.4°F | Wt 245.2 lb

## 2019-04-22 DIAGNOSIS — R519 Headache, unspecified: Secondary | ICD-10-CM

## 2019-04-22 DIAGNOSIS — R42 Dizziness and giddiness: Secondary | ICD-10-CM

## 2019-04-22 DIAGNOSIS — M542 Cervicalgia: Secondary | ICD-10-CM | POA: Diagnosis present

## 2019-04-22 NOTE — Progress Notes (Signed)
   Subjective:    Patient ID: Justin Estrada, male    DOB: 1965/09/18, 54 y.o.   MRN: [id number]  Dizziness This is a chronic problem. The current episode started more than 1 month ago. Pertinent negatives include no abdominal pain, chest pain, congestion, coughing, fatigue, headaches, nausea or vomiting. Associated symptoms comments: Dizziness is not constant, last week had no issues, pt is having frontal pain above eyes. Pt states that sometimes he feels like he is in a "fog". Pt having neck pain at base of skull also. Fog does not affect thinking or decision making skills. Exacerbated by: sudden movements.   Very nice patient Having several different issues Intermittent dizzy sensation where he feels lightheaded sometimes last few seconds sometimes a few minutes no true room spinning with it no nausea or vomiting no double vision but he has difficult time with items that are very close to his face sometimes the shower stall at his house is small and it visually throws him off  Patient also relates bilateral cervical pain and discomfort in the back of his neck feels stiff with rotating looking up looking down denies numbness or tingling radiating down the arms  Patient also relates a pressure headache sensation and now central forehead region that sometimes last several minutes sometimes several hours causes significant amount of discomfort no nausea or vomiting with it does not wake him up at night has had a normal MRI in the past  Patient does not sleep well due to a multitude of reasons including his wife snores also the dog wakes him up to take him outside so it has been interrupting his ability to rest well   Review of Systems  Constitutional: Negative for activity change, appetite change and fatigue.  HENT: Negative for congestion and rhinorrhea.   Respiratory: Negative for cough and shortness of breath.   Cardiovascular: Negative for chest pain and leg swelling.  Gastrointestinal:  Negative for abdominal pain, nausea and vomiting.  Neurological: Positive for dizziness. Negative for headaches.  Psychiatric/Behavioral: Negative for agitation and behavioral problems.  Positive for cervical neck discomfort     Objective:   Physical Exam  Lungs are clear respiratory rate normal heart regular no murmurs Blood pressure lying sitting standing no appreciable change finger-to-nose normal Romberg negative can walk without difficulty decent range of motion of the neck with subjective discomfort in the posterior regions left and right     Assessment & Plan:  1. Arthralgia, cervical spine Arthralgia recommend x-ray recommend gentle range of motion hold off on any type of MRI hold off on Botox injections neurology referral - [facility] Cervical Spine Complete  2. Lightheadedness This is transient patient will map this out over the next month do not feel any medications are indicated keep well-hydrated exercise regular basis  3. Frontal headache Intermittent frontal headaches could be a atypical migraine versus tension headache patient will watch it closely he will notify us if not seeing significant improvement he will keep a headache diary over the next 30 days and send it back to me Consider Topamax if ongoing troubles

## 2019-04-25 ENCOUNTER — Telehealth: Payer: Self-pay | Admitting: Family Medicine

## 2019-04-25 ENCOUNTER — Other Ambulatory Visit: Payer: Self-pay | Admitting: Family Medicine

## 2019-04-25 ENCOUNTER — Encounter: Payer: Self-pay | Admitting: Family Medicine

## 2019-04-25 NOTE — Telephone Encounter (Signed)
Nurses I had sent a message previous Patient needs headache calendar/diary forms He is requesting for these to be emailed to him if possible if not please make sure that they get mailed. Please connect with patient regarding this thank you

## 2019-04-25 NOTE — Telephone Encounter (Signed)
error 

## 2019-04-28 ENCOUNTER — Encounter: Payer: Self-pay | Admitting: Family Medicine

## 2019-04-28 DIAGNOSIS — R42 Dizziness and giddiness: Secondary | ICD-10-CM

## 2019-04-29 NOTE — Telephone Encounter (Signed)
Justin Albert, MD     Pt has been having intermittent multiple symptoms for quite some time. I really do not have much to intervene with on this symptom today, call pt and tell him this will be forwarded to his clinician dr Lorin Picket on monday

## 2019-05-01 NOTE — Telephone Encounter (Signed)
It would be fine to document this on his profile I would also recommend the patient do some blood work to make sure there is not nutritional deficiencies I recommend CBC, vitamin D, ferritin, B12.

## 2019-05-02 NOTE — Addendum Note (Signed)
Addended by: Marlowe Shores on: 05/02/2019 09:00 AM   Modules accepted: Orders

## 2019-05-03 LAB — CBC WITH DIFFERENTIAL/PLATELET
Basophils Absolute: 0.1 10*3/uL (ref 0.0–0.2)
Basos: 2 %
EOS (ABSOLUTE): 0.2 10*3/uL (ref 0.0–0.4)
Eos: 3 %
Hematocrit: 46.6 % (ref 37.5–51.0)
Hemoglobin: 16.3 g/dL (ref 13.0–17.7)
Immature Grans (Abs): 0 10*3/uL (ref 0.0–0.1)
Immature Granulocytes: 0 %
Lymphocytes Absolute: 1.9 10*3/uL (ref 0.7–3.1)
Lymphs: 26 %
MCH: 30.5 pg (ref 26.6–33.0)
MCHC: 35 g/dL (ref 31.5–35.7)
MCV: 87 fL (ref 79–97)
Monocytes Absolute: 0.7 10*3/uL (ref 0.1–0.9)
Monocytes: 10 %
Neutrophils Absolute: 4.5 10*3/uL (ref 1.4–7.0)
Neutrophils: 59 %
Platelets: 327 10*3/uL (ref 150–450)
RBC: 5.34 x10E6/uL (ref 4.14–5.80)
RDW: 12.7 % (ref 11.6–15.4)
WBC: 7.5 10*3/uL (ref 3.4–10.8)

## 2019-05-03 LAB — VITAMIN D 25 HYDROXY (VIT D DEFICIENCY, FRACTURES): Vit D, 25-Hydroxy: 18.7 ng/mL — ABNORMAL LOW (ref 30.0–100.0)

## 2019-05-03 LAB — FERRITIN: Ferritin: 103 ng/mL (ref 30–400)

## 2019-05-03 LAB — VITAMIN B12: Vitamin B-12: 505 pg/mL (ref 232–1245)

## 2019-05-04 ENCOUNTER — Encounter: Payer: Self-pay | Admitting: Family Medicine

## 2019-05-04 ENCOUNTER — Other Ambulatory Visit: Payer: Self-pay | Admitting: Family Medicine

## 2019-05-04 MED ORDER — VITAMIN D (ERGOCALCIFEROL) 1.25 MG (50000 UNIT) PO CAPS: ORAL_CAPSULE | ORAL | 2 refills | Status: DC

## 2019-05-04 NOTE — Telephone Encounter (Signed)
Called pt to get more information. He states he was seen recently for pain in his left arm pit. At that time he was also having pain in pectoral region. The pain went away after taking 2 anti inflammatories. The pain has come back only in his left arm pit about 2 - 3 days ago. States the pain is not always there. Does not notice it at night when he is sleeping and usually not there in the morning but as the day goes on he starts to have it. But he is not having the pain when I spoke to him at 3:55 pm. He has not tried anything for the pain and he states he is not having pain in chest or pectoral region

## 2019-05-05 ENCOUNTER — Encounter: Payer: Self-pay | Admitting: Family Medicine

## 2019-05-05 ENCOUNTER — Ambulatory Visit: Payer: BC Managed Care – PPO | Admitting: Cardiovascular Disease

## 2019-05-05 MED ORDER — MELOXICAM 7.5 MG PO TABS: ORAL_TABLET | ORAL | 0 refills | Status: DC

## 2019-05-05 NOTE — Telephone Encounter (Signed)
Nurses I would recommend if the pain occurs only occasionally can sporadically stretches massage warm compresses as well as may use meloxicam 7.5 mg 1 daily for maximum of 7 days in a row May send in a prescription for meloxicam 7.5 mg, #30, 1 daily as needed musculoskeletal pain not for frequent use  Certainly if it becomes persistent and more problematic I would recommend a follow-up  Patient may give [place] an update in the near future how things are going

## 2019-05-05 NOTE — Addendum Note (Signed)
Addended by: Marlowe Shores on: 05/05/2019 11:55 AM   Modules accepted: Orders

## 2019-05-26 ENCOUNTER — Ambulatory Visit: Payer: BC Managed Care – PPO | Admitting: Family Medicine

## 2019-05-26 ENCOUNTER — Other Ambulatory Visit: Payer: Self-pay

## 2019-05-26 VITALS — BP 122/74 | Temp 97.4°F | Ht 71.0 in | Wt 245.0 lb

## 2019-05-26 DIAGNOSIS — R42 Dizziness and giddiness: Secondary | ICD-10-CM

## 2019-05-26 DIAGNOSIS — R0789 Other chest pain: Secondary | ICD-10-CM

## 2019-05-26 NOTE — Progress Notes (Signed)
   Subjective:    Patient ID: [name], male    DOB: Apr 25, 1965, 54 y.o.   MRN: [id number]  HPI finished the last of the vitamin d and pt states dizziness is better.   Headaches are also better. Has had a couple of headaches that do not last long.  Patient stress levels are doing well  Still having some discomfort in chest. Only took two meloxicam and it seemed to help but didn't want to keep taking med.  He occasionally gets aching pain or sharp pain in the left axilla/pectoral area.  Not severe. His energy level doing well.  Denies chest tightness pressure pain Review of Systems     Objective:   Physical Exam  Lungs clear respiratory rate normal heart regular no murmurs extremities no edema axilla no lymph nodes noted ,neg cardiac cath Has had negative CXR, stress test    Assessment & Plan:  1. Lightheadedness Much better since vit d Go to 1000 iu vit d daily  2. Chest wall pain Musculoskeletal most likely Use mobic prn ifongoing notify us If worsening consider CT chest  Wellness in the summer

## 2019-06-24 ENCOUNTER — Ambulatory Visit: Payer: BC Managed Care – PPO | Admitting: Family Medicine

## 2019-06-24 ENCOUNTER — Encounter: Payer: Self-pay | Admitting: Family Medicine

## 2019-06-24 ENCOUNTER — Other Ambulatory Visit: Payer: Self-pay

## 2019-06-24 VITALS — BP 124/72 | Temp 97.0°F | Wt 245.6 lb

## 2019-06-24 DIAGNOSIS — M5432 Sciatica, left side: Secondary | ICD-10-CM

## 2019-06-24 DIAGNOSIS — Z125 Encounter for screening for malignant neoplasm of prostate: Secondary | ICD-10-CM | POA: Diagnosis not present

## 2019-06-24 DIAGNOSIS — E785 Hyperlipidemia, unspecified: Secondary | ICD-10-CM | POA: Diagnosis not present

## 2019-06-24 DIAGNOSIS — E559 Vitamin D deficiency, unspecified: Secondary | ICD-10-CM

## 2019-06-24 DIAGNOSIS — Z79899 Other long term (current) drug therapy: Secondary | ICD-10-CM

## 2019-06-24 DIAGNOSIS — R0789 Other chest pain: Secondary | ICD-10-CM

## 2019-06-24 DIAGNOSIS — G5602 Carpal tunnel syndrome, left upper limb: Secondary | ICD-10-CM

## 2019-06-24 DIAGNOSIS — G44209 Tension-type headache, unspecified, not intractable: Secondary | ICD-10-CM

## 2019-06-24 NOTE — Progress Notes (Signed)
   Subjective:    Patient ID: Justin Estrada, male    DOB: 06-16-1965, 54 y.o.   MRN: [id number] Very nice patient Comes in with multiple different issues HPI Pt here today for follow up. Pt states he is doing better. Pt has had slight touch of vertigo at times but does go away quickly. Standing still or sitting is when he really notices it. Pt is having tension in eye area.  Patient relates sometimes this happens when he is driving other times when he is working no nausea or vomiting or double vision he has had a stress test as well as cardiac catheterization which essentially was negative patient has ongoing sharp pains in the left side of the chest intermittent no pressure with it does not occur with activity denies any coughing sweats chills fever weight loss   patient has had an MRI of the head in the past which was negative  Patient has a history of intermittent chest pains and discomfort pt still having some discomfort in left shoulder at times. Tingling in left arm at night when sleeping at times. Pt has been wearing wrist brace to help with this.  Patient also relates occasionally having sharp pains and discomfort down the left lateral thigh toward the knee from the buttock region no leg weakness Intermittent numbness in the left hand related to the brace he is wearing  Review of Systems     Objective:   Physical Exam  Finger-to-nose normal Romberg negative no nystagmus neck no masses lungs clear respiratory rate normal left axilla no masses chest wall nontender abdomen is soft no rebound or tenderness decent range of motion      Assessment & Plan:  1. Vitamin D deficiency Patient will do lab work before follow-up visit - Vitamin D (25 hydroxy) - Basic Metabolic Panel (BMET) - Lipid Profile - PSA  2. Dyslipidemia Patient trying to work hard on eating habits will check lab work before his wellness in June - Vitamin D (25 hydroxy) - Basic Metabolic Panel (BMET) - Lipid  Profile - PSA  3. High risk medication use See labs - Vitamin D (25 hydroxy) - Basic Metabolic Panel (BMET) - Lipid Profile - PSA  4. Screening PSA (prostate specific antigen) PSA ordered - Vitamin D (25 hydroxy) - Basic Metabolic Panel (BMET) - Lipid Profile - PSA  5. Sciatica, left side Intermittent not severe enough to do imaging recommend stretching exercises  6. Musculoskeletal chest pain Pectoralis stretching recommended  7. Carpal tunnel syndrome of left wrist Continue brace use at night if progressive symptoms or worse notify us  8. Tension headache Tylenol as needed stress reduction techniques recommended good posture recommended regular physical activity and healthy eating Patient was given and GAD-7 questionnaire that he will fill out

## 2019-07-12 LAB — LIPID PANEL
Chol/HDL Ratio: 4.3 ratio (ref 0.0–5.0)
Cholesterol, Total: 156 mg/dL (ref 100–199)
HDL: 36 mg/dL — ABNORMAL LOW (ref >39)
LDL Chol Calc (NIH): 100 mg/dL — ABNORMAL HIGH (ref 0–99)
Triglycerides: 110 mg/dL (ref 0–149)
VLDL Cholesterol Cal: 20 mg/dL (ref 5–40)

## 2019-07-12 LAB — BASIC METABOLIC PANEL
BUN/Creatinine Ratio: 14 (ref 9–20)
BUN: 15 mg/dL (ref 6–24)
CO2: 22 mmol/L (ref 20–29)
Calcium: 9.3 mg/dL (ref 8.7–10.2)
Chloride: 105 mmol/L (ref 96–106)
Creatinine, Ser: 1.08 mg/dL (ref 0.76–1.27)
GFR calc Af Amer: 90 mL/min/{1.73_m2} (ref >59)
GFR calc non Af Amer: 78 mL/min/{1.73_m2} (ref >59)
Glucose: 98 mg/dL (ref 65–99)
Potassium: 4.7 mmol/L (ref 3.5–5.2)
Sodium: 143 mmol/L (ref 134–144)

## 2019-07-12 LAB — VITAMIN D 25 HYDROXY (VIT D DEFICIENCY, FRACTURES): Vit D, 25-Hydroxy: 41.4 ng/mL (ref 30.0–100.0)

## 2019-07-12 LAB — PSA: Prostate Specific Ag, Serum: 0.5 ng/mL (ref 0.0–4.0)

## 2019-07-22 ENCOUNTER — Other Ambulatory Visit: Payer: Self-pay | Admitting: *Deleted

## 2019-07-22 ENCOUNTER — Encounter: Payer: Self-pay | Admitting: Family Medicine

## 2019-07-22 MED ORDER — MELOXICAM 15 MG PO TABS
ORAL_TABLET | ORAL | 0 refills | Status: DC
Start: 2019-07-22 — End: 2019-10-04

## 2019-07-22 NOTE — Telephone Encounter (Signed)
Hopefully short-term use of anti-inflammatory would help   for short term use Mobic 15 mg 1 daily as needed to help with the discomfort, #14 Keep follow-up with Dr. Ladona Ridgel And keep regular scheduled follow-ups with myself thank you

## 2019-07-22 NOTE — Telephone Encounter (Signed)
I called pt to get more information and he states he is still having pain in left rib that is tender to touch. Soreness under left arm. Feels like he has worked out. States it is becoming more noticeable. He wanted appt today if possible late afternoon. I transferred to the front and they offered 3:50 but pt stats he cant make that time because his daughter drove him and she will still be at school. They offered him 3 other appts next week and none worked. But he did get one with dr Ladona Ridgel next week.

## 2019-07-22 NOTE — Telephone Encounter (Signed)
Called and discussed with pt. Pt verbalized understanding and mobic sent to [facility]

## 2019-07-27 ENCOUNTER — Ambulatory Visit: Payer: BC Managed Care – PPO | Admitting: Family Medicine

## 2019-08-02 ENCOUNTER — Encounter: Payer: Self-pay | Admitting: Family Medicine

## 2019-08-02 ENCOUNTER — Ambulatory Visit (INDEPENDENT_AMBULATORY_CARE_PROVIDER_SITE_OTHER): Payer: BC Managed Care – PPO | Admitting: Family Medicine

## 2019-08-02 ENCOUNTER — Other Ambulatory Visit: Payer: Self-pay

## 2019-08-02 VITALS — BP 130/86 | Temp 97.9°F | Ht 71.3 in | Wt 245.8 lb

## 2019-08-02 DIAGNOSIS — Z0000 Encounter for general adult medical examination without abnormal findings: Secondary | ICD-10-CM

## 2019-08-02 DIAGNOSIS — G4730 Sleep apnea, unspecified: Secondary | ICD-10-CM | POA: Diagnosis not present

## 2019-08-02 NOTE — Progress Notes (Signed)
Subjective:    Patient ID: Justin Estrada, male    DOB: [birth year], 54 y.o.   MRN: [id number]  HPI Very nice patient comes in for wellness  The patient comes in today for a wellness visit.  Patient is starting to do some walking watching his diet trying to lose weight He does have some musculoskeletal discomfort left upper chest has had previous card cardiac work-up which was negative also had chest x-ray which was negative denies any burning or pain in his back or down the arm.  No weight loss fever chills or sweats. Patient does have restless sleep difficulty sleeping at night multiple times waking up also snores a lot.  Feels fatigued during the day with heaviness in his eyes. A review of their health history was completed.  A review of medications was also completed.  Any needed refills; none  Eating habits: not great but getting better  Falls/  MVA accidents in past few months: none  Regular exercise: little but now that school is out he plans on walking 4 miles per day  Specialist pt sees on regular basis: none  Preventative health issues were discussed.   Additional concerns: Patient is still having some pain on his upper left side that he has mentioned before.   Also states he has continued to have trouble sleeping.  He also mentioned feeling as if his eyes are heavy at times, somewhat dizzy feeling.  Review of Systems  Constitutional: Negative for activity change.  HENT: Negative for congestion and rhinorrhea.   Respiratory: Negative for cough and shortness of breath.   Cardiovascular: Negative for chest pain.  Gastrointestinal: Negative for abdominal pain, diarrhea, nausea and vomiting.  Genitourinary: Negative for dysuria and hematuria.  Neurological: Negative for weakness and headaches.  Psychiatric/Behavioral: Negative for behavioral problems and confusion.       Objective:   Physical Exam Vitals reviewed.  Constitutional:      General: He is not in acute  distress. HENT:     Head: Normocephalic and atraumatic.  Eyes:     General:        Right eye: No discharge.        Left eye: No discharge.  Neck:     Trachea: No tracheal deviation.  Cardiovascular:     Rate and Rhythm: Normal rate and regular rhythm.     Heart sounds: Normal heart sounds. No murmur.  Pulmonary:     Effort: Pulmonary effort is normal. No respiratory distress.     Breath sounds: Normal breath sounds.  Genitourinary:    Prostate: Normal.  Lymphadenopathy:     Cervical: No cervical adenopathy.  Skin:    General: Skin is warm and dry.  Neurological:     Mental Status: He is alert.     Coordination: Coordination normal.  Psychiatric:        Behavior: Behavior normal.           Assessment & Plan:  Adult wellness-complete.wellness physical was conducted today. Importance of diet and exercise were discussed in detail.  In addition to this a discussion regarding safety was also covered. We also reviewed over immunizations and gave recommendations regarding current immunization needed for age.  In addition to this additional areas were also touched on including: Preventative health exams needed:  Colonoscopy due 2028  Patient was advised yearly wellness exam Referral for sleep study  Musculoskeletal upper left chest pain May use Mobic as needed stretching exercises recommended hold off on MRI of  thoracic spine but if ongoing troubles that are worsening that is the next step with referral to Dr. Nelva Bush for further evaluation of the possibility of an impingement of the nerve

## 2019-08-08 ENCOUNTER — Other Ambulatory Visit: Payer: Self-pay | Admitting: *Deleted

## 2019-08-08 DIAGNOSIS — G4730 Sleep apnea, unspecified: Secondary | ICD-10-CM

## 2019-08-15 ENCOUNTER — Encounter: Payer: Self-pay | Admitting: Family Medicine

## 2019-08-17 ENCOUNTER — Other Ambulatory Visit (HOSPITAL_BASED_OUTPATIENT_CLINIC_OR_DEPARTMENT_OTHER): Payer: Self-pay

## 2019-09-08 ENCOUNTER — Other Ambulatory Visit: Payer: Self-pay

## 2019-09-08 ENCOUNTER — Encounter: Payer: Self-pay | Admitting: Family Medicine

## 2019-09-08 ENCOUNTER — Ambulatory Visit: Payer: BC Managed Care – PPO | Admitting: Family Medicine

## 2019-09-08 VITALS — BP 128/82 | Temp 97.8°F | Wt 241.8 lb

## 2019-09-08 DIAGNOSIS — R0789 Other chest pain: Secondary | ICD-10-CM | POA: Diagnosis not present

## 2019-09-08 NOTE — Progress Notes (Signed)
   Subjective:    Patient ID: Justin Estrada, male    DOB: 19-Oct-1965, 54 y.o.   MRN: [id number]  HPI  Patient arrives with right sided rib/back pain for 2-3 days Rib pain discomfort on the right side hurts with certain movements and rotations.  Denies high fever chills or sweats. [place] nausea vomiting heartburn. Energy level overall doing halfway well  Review of Systems  Constitutional: Negative for activity change, fatigue and fever.  HENT: Negative for congestion and rhinorrhea.   Respiratory: Negative for cough and shortness of breath.   Cardiovascular: Negative for chest pain and leg swelling.  Gastrointestinal: Negative for abdominal pain, diarrhea and nausea.  Genitourinary: Negative for dysuria and hematuria.  Neurological: Negative for weakness and headaches.  Psychiatric/Behavioral: Negative for agitation and behavioral problems.       Objective:   Physical Exam Lungs clear respiratory rate normal heart regular flanks nontender to percussion abdomen is soft no guarding or rebound subjective discomfort on the right side of the lower ribs with movements.   Warning signs were discussed of high fever severe pain rash any unusual symptoms notify us right away    Assessment & Plan:  Very unlikely to be kidney stone Unlikely to be gallbladder disease with recent test being normal More likely to be musculoskeletal May do stretches on a regular basis If progressive troubles or worse to follow-up

## 2019-09-12 ENCOUNTER — Other Ambulatory Visit (HOSPITAL_COMMUNITY): Payer: BC Managed Care – PPO

## 2019-10-04 ENCOUNTER — Ambulatory Visit: Payer: BC Managed Care – PPO | Admitting: Family Medicine

## 2019-10-04 VITALS — BP 128/78 | Temp 98.0°F | Wt 243.6 lb

## 2019-10-04 DIAGNOSIS — M533 Sacrococcygeal disorders, not elsewhere classified: Secondary | ICD-10-CM | POA: Diagnosis not present

## 2019-10-04 MED ORDER — ETODOLAC 300 MG PO CAPS: 300.0000 mg | ORAL_CAPSULE | Freq: Two times a day (BID) | ORAL | 0 refills | Status: DC

## 2019-10-04 NOTE — Progress Notes (Signed)
   Subjective:    Patient ID: Justin Estrada, male    DOB: 08-04-1965, 54 y.o.   MRN: [id number]  HPI  Patient arrives with lower back pain for 2 weeks Lumbar pain discomfort hurts with sitting and certain movements hurts when he wakes up in the morning does not radiate down the legs denies any injury denies any falls does not do any stretching has not had any other issues no pain down the legs  Patient has had a couple times right thoracic region radiated around toward the right upper quadrant describes as a sharp pain that comes and goes  Please see previous testing Review of Systems  Constitutional: Negative for diaphoresis and fatigue.  HENT: Negative for congestion and rhinorrhea.   Respiratory: Negative for cough and shortness of breath.   Cardiovascular: Negative for chest pain and leg swelling.  Gastrointestinal: Positive for abdominal pain. Negative for diarrhea.  Musculoskeletal: Positive for back pain. Negative for arthralgias.  Skin: Negative for color change and rash.  Neurological: Negative for dizziness and headaches.  Psychiatric/Behavioral: Negative for behavioral problems and confusion.       Objective:   Physical Exam Vitals reviewed.  Constitutional:      General: He is not in acute distress. HENT:     Head: Normocephalic and atraumatic.  Eyes:     General:        Right eye: No discharge.        Left eye: No discharge.  Neck:     Trachea: No tracheal deviation.  Cardiovascular:     Rate and Rhythm: Normal rate and regular rhythm.     Heart sounds: Normal heart sounds. No murmur heard.   Pulmonary:     Effort: Pulmonary effort is normal. No respiratory distress.     Breath sounds: Normal breath sounds.  Lymphadenopathy:     Cervical: No cervical adenopathy.  Skin:    General: Skin is warm and dry.  Neurological:     Mental Status: He is alert.     Coordination: Coordination normal.  Psychiatric:        Behavior: Behavior normal.    Abdomen soft  no guarding rebound or tenderness Subjective discomfort in the lower back Limited range of motion of the hip Negative straight leg raise strength good      Assessment & Plan:  Sacroiliac inflammation recommend stretching exercises recommended anti-inflammatory over the course the next couple weeks if ongoing troubles consider x-rays follow-up if progressive troubles  Also may have thoracic nerve impingement if worsens may need MRI of the thoracic spine this is been off and on for months  Patient is to give [place] feedback in the next few weeks how he is doing

## 2019-10-05 ENCOUNTER — Ambulatory Visit: Payer: BC Managed Care – PPO | Attending: Family Medicine | Admitting: Neurology

## 2019-10-05 ENCOUNTER — Other Ambulatory Visit: Payer: Self-pay

## 2019-10-05 DIAGNOSIS — G4730 Sleep apnea, unspecified: Secondary | ICD-10-CM | POA: Diagnosis present

## 2019-10-05 DIAGNOSIS — R0683 Snoring: Secondary | ICD-10-CM | POA: Diagnosis not present

## 2019-10-05 DIAGNOSIS — Z79899 Other long term (current) drug therapy: Secondary | ICD-10-CM | POA: Diagnosis not present

## 2019-10-05 DIAGNOSIS — F518 Other sleep disorders not due to a substance or known physiological condition: Secondary | ICD-10-CM | POA: Diagnosis not present

## 2019-10-08 NOTE — Procedures (Signed)
  [facility] Tyrika Newman A. Gerilyn Pilgrim, MD     [email or web address]             NOCTURNAL POLYSOMNOGRAPHY   LOCATION: [facility]    Patient Name: Justin Estrada, Justin Estrada Date: 10/05/2019 Gender: Male D.O.B: April 23, 1965 Age (years): 45 Referring Provider: Lilyan Punt Height (inches): 71 Interpreting Physician: Beryle Beams MD, ABSM Weight (lbs): 235 RPSGT: Peak, Robert BMI: 33 MRN: [id number] Neck Size: 17.50 CLINICAL INFORMATION Sleep Study Type: NPSG     Indication for sleep study: N/A     Epworth Sleepiness Score: 10     SLEEP STUDY TECHNIQUE As per the AASM Manual for the Scoring of Sleep and Associated Events v2.3 (April 2016) with a hypopnea requiring 4% desaturations.  The channels recorded and monitored were frontal, central and occipital EEG, electrooculogram (EOG), submentalis EMG (chin), nasal and oral airflow, thoracic and abdominal wall motion, anterior tibialis EMG, snore microphone, electrocardiogram, and pulse oximetry.  MEDICATIONS Medications self-administered by patient taken the night of the study : N/A  Current Outpatient Medications:  .  diltiazem 2 % GEL, Apply 1 application topically at bedtime as needed (anal fissure). Use as directed., Disp: 30 g, Rfl: 1 .  docusate sodium (COLACE) 100 MG capsule, Take 1 capsule (100 mg total) by mouth 2 (two) times daily. (Patient taking differently: Take 100 mg by mouth at bedtime. ), Disp: 60 capsule, Rfl: 0 .  etodolac (LODINE) 300 MG capsule, Take 1 capsule (300 mg total) by mouth 2 (two) times daily., Disp: 30 capsule, Rfl: 0 .  psyllium (METAMUCIL SMOOTH TEXTURE) 28 % packet, Take 1 packet by mouth at bedtime., Disp: , Rfl:      SLEEP ARCHITECTURE The study was initiated at 10:18:28 PM and ended at 5:02:12 AM.  Sleep onset time was 5.9 minutes and the sleep efficiency was 67.2%%. The total sleep time was 271.4 minutes.  Stage REM latency was 171.0 minutes.  The patient spent 2.2%% of the night  in stage N1 sleep, 82.5%% in stage N2 sleep, 4.4%% in stage N3 and 10.9% in REM.  Alpha intrusion was absent.  Supine sleep was 0.00%.  RESPIRATORY PARAMETERS The overall apnea/hypopnea index (AHI) was 0.0 per hour. There were 0 total apneas, including 0 obstructive, 0 central and 0 mixed apneas. There were 0 hypopneas and 0 RERAs.  The AHI during Stage REM sleep was 0.0 per hour.  AHI while supine was N/A per hour.  The mean oxygen saturation was 94.6%. The minimum SpO2 during sleep was 82.0%.  soft snoring was noted during this study.  CARDIAC DATA The 2 lead EKG demonstrated sinus rhythm. The mean heart rate was 52.8 beats per minute. Other EKG findings include: None.  LEG MOVEMENT DATA The total PLMS were 0 with a resulting PLMS index of 0.0. Associated arousal with leg movement index was 0.0.  IMPRESSIONS 1.  This study shows reduced deficiency and frequent arousal. Otherwise, the recording is unremarkable.   Argie Ramming, MD Diplomate, American Board of Sleep Medicine.   ELECTRONICALLY SIGNED ON:  10/08/2019, 5:07 PM [facility] PH: [phone]   FX: [phone] ACCREDITED BY THE AMERICAN ACADEMY OF SLEEP MEDICINE

## 2019-10-11 ENCOUNTER — Ambulatory Visit (INDEPENDENT_AMBULATORY_CARE_PROVIDER_SITE_OTHER): Payer: BC Managed Care – PPO | Admitting: Internal Medicine

## 2019-10-11 ENCOUNTER — Telehealth: Payer: Self-pay | Admitting: Family Medicine

## 2019-10-11 NOTE — Telephone Encounter (Signed)
Sleep study negative No sign of any sleep apnea. No stoppage of breathing. I will send patient some information regarding sleep and behavioral techniques Patient should give [place] feedback within the next 3 to 4 weeks how his sleep is doing Nurses please Call patient

## 2019-10-12 NOTE — Telephone Encounter (Signed)
Patient notified and verbalized understanding. 

## 2019-10-12 NOTE — Telephone Encounter (Signed)
Discussed with pt. Pt verbalized understanding.  °

## 2019-10-12 NOTE — Telephone Encounter (Signed)
Sleep info packet may be mailed to the patient.  Please see documents in the green form folder

## 2019-11-02 ENCOUNTER — Ambulatory Visit (INDEPENDENT_AMBULATORY_CARE_PROVIDER_SITE_OTHER): Payer: BC Managed Care – PPO

## 2019-11-02 ENCOUNTER — Ambulatory Visit: Payer: BC Managed Care – PPO | Admitting: Podiatry

## 2019-11-02 ENCOUNTER — Other Ambulatory Visit: Payer: Self-pay

## 2019-11-02 ENCOUNTER — Encounter: Payer: Self-pay | Admitting: Podiatry

## 2019-11-02 DIAGNOSIS — M205X9 Other deformities of toe(s) (acquired), unspecified foot: Secondary | ICD-10-CM

## 2019-11-02 DIAGNOSIS — M778 Other enthesopathies, not elsewhere classified: Secondary | ICD-10-CM | POA: Diagnosis not present

## 2019-11-03 NOTE — Progress Notes (Signed)
Subjective:   Patient ID: Justin Estrada, male   DOB: 54 y.o.   MRN: [id number]   HPI Patient states he developed pain in his left big toe and had surgery on it seven or 8 years ago. States since then he is done pretty well and also the right one had surgery and is doing well but this started to occur around a month ago with no history of change in activities. Patient does not smoke likes to be active   Review of Systems  All other systems reviewed and are negative.       Objective:  Physical Exam Vitals and nursing note reviewed.  Constitutional:      Appearance: He is well-developed.  Pulmonary:     Effort: Pulmonary effort is normal.  Musculoskeletal:        General: Normal range of motion.  Skin:    General: Skin is warm.  Neurological:     Mental Status: He is alert.     Neurovascular status intact muscle strength adequate range of motion within normal limits. Patient is found to have healed incision sites left and right first metatarsal with good range of motion of the joint surface no crepitus of the joint and I did note mild to moderate discomfort at the MPJ and the inner phalangeal joint of the left big toe with soreness against the surface and with movement but no indications of arthritis. 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

## 2019-11-14 ENCOUNTER — Ambulatory Visit (INDEPENDENT_AMBULATORY_CARE_PROVIDER_SITE_OTHER): Payer: BC Managed Care – PPO | Admitting: Internal Medicine

## 2019-11-15 ENCOUNTER — Encounter (INDEPENDENT_AMBULATORY_CARE_PROVIDER_SITE_OTHER): Payer: Self-pay

## 2019-11-15 ENCOUNTER — Ambulatory Visit (INDEPENDENT_AMBULATORY_CARE_PROVIDER_SITE_OTHER): Payer: BC Managed Care – PPO | Admitting: Internal Medicine

## 2019-11-15 ENCOUNTER — Encounter (INDEPENDENT_AMBULATORY_CARE_PROVIDER_SITE_OTHER): Payer: Self-pay | Admitting: Internal Medicine

## 2019-11-15 ENCOUNTER — Other Ambulatory Visit: Payer: Self-pay

## 2019-11-15 VITALS — BP 132/78 | HR 71 | Temp 96.8°F | Ht 71.0 in | Wt 243.1 lb

## 2019-11-15 DIAGNOSIS — K824 Cholesterolosis of gallbladder: Secondary | ICD-10-CM | POA: Diagnosis not present

## 2019-11-15 DIAGNOSIS — R1011 Right upper quadrant pain: Secondary | ICD-10-CM

## 2019-11-15 NOTE — Progress Notes (Signed)
Presenting complaint;  Epigastric and right upper quadrant abdominal pain and bloating. History of anal fissure.  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  He was last seen in September 2020. He states he has not had any flareup with fissure in ano.  He has not used diltiazem gel in over 3 to 4 months.  He complains of abdominal rumbling rare heartburn.  He generally has 2 bowel movements.  His bowel movements occur like clockwork.  He may have occasional diarrhea.  No bleeding or melena.  He has intermittent right upper quadrant pain.  He did have an episode of sharp pain which seem to get better as he stretches body.  He wonders if that episode was due to pulled muscle.  He has not experienced nausea or vomiting with this pain.  He had colonoscopy in June 2018 for screening purposes.  He had internal hemorrhoids and anal tags. His last EGD was in August 2019 for chest and epigastric pain.  He had prepyloric scar and gastritis.  Gastric biopsy was negative for H. pylori infection.  Last abdominal ultrasound was in September 2020 revealing 7 mm gallbladder polyp. He had HIDA scan with [facility] in December 2020 and was normal.  Current Medications: Outpatient Encounter Medications as of 11/15/2019  Medication Sig  . diltiazem 2 % GEL Apply 1 application topically at bedtime as needed (anal fissure). Use as directed.  . docusate sodium (COLACE) 100 MG capsule Take 1 capsule (100 mg total) by mouth 2 (two) times daily. (Patient taking differently: Take 100 mg by mouth at bedtime. )  . psyllium (METAMUCIL SMOOTH TEXTURE) 28 % packet Take 1 packet by mouth at bedtime.  Marland Kitchen etodolac (LODINE) 300 MG capsule Take 1 capsule (300 mg total) by mouth 2 (two) times daily. (Patient not taking: Reported on  11/15/2019)  . etodolac (LODINE) 400 MG tablet Take 400 mg by mouth 2 (two) times daily. (Patient not taking: Reported on 11/15/2019)   No facility-administered encounter medications on file as of 11/15/2019.     Objective: Blood pressure 132/78, pulse 71, temperature (!) 96.8 F (36 C), temperature source Temporal, height _0  (1.803 m), weight 243 lb 1.6 oz (110.3 kg). Patient is alert and in no acute distress. He is wearing a mask. Conjunctiva is pink. Sclera is nonicteric Oropharyngeal mucosa is normal. No neck masses or thyromegaly noted. Cardiac exam with regular rhythm normal S1 and S2. No murmur or gallop noted. Lungs are clear to auscultation. Abdomen is full.  On palpation is soft and nontender with no organomegaly or masses. No LE edema or clubbing noted.  Labs/studies Results:  CBC Latest Ref Rng & Units 05/02/2019 03/08/2019 09/23/2017  WBC 3.4 - 10.8 x10E3/uL 7.5 6.2 5.7  Hemoglobin 13.0 - 17.7 g/dL 16.3 15.8 15.3  Hematocrit 37.5 - 51.0 % 46.6 46.5 44.6  Platelets 150 - 450 x10E3/uL 327 312 250    CMP Latest Ref Rng & Units 07/11/2019 03/08/2019 05/01/2018  Glucose 65 - 99 mg/dL 98 99 91  BUN 6 - 24 mg/dL _1 Creatinine 0.76 - 1.27 mg/dL 1.08 1.27 1.21  Sodium 134 - 144 mmol/L 143 142 148(H)  Potassium 3.5 - 5.2 mmol/L 4.7 5.0 5.1  Chloride 96 - 106 mmol/L 105 106 109(H)  CO2 20 - 29 mmol/L 22  29 25  Calcium 8.7 - 10.2 mg/dL 9.3 9.5 9.4  Total Protein 6.0 - 8.5 g/dL - - 6.5  Total Bilirubin 0.0 - 1.2 mg/dL - - 1.0  Alkaline Phos 39 - 117 IU/L - - 83  AST 0 - 40 IU/L - - 10  ALT 0 - 44 IU/L - - 18    Hepatic Function Latest Ref Rng & Units 05/01/2018 04/18/2017 07/23/2016  Total Protein 6.0 - 8.5 g/dL 6.5 6.7 7.4  Albumin 3.8 - 4.9 g/dL 4.4 4.4 5.0  AST 0 - 40 IU/L _0 ALT 0 - 44 IU/L _1 Alk Phosphatase 39 - 117 IU/L 83 77 74  Total Bilirubin 0.0 - 1.2 mg/dL 1.0 0.7 0.5  Bilirubin, Direct 0.00 - 0.40 mg/dL 0.22 0.14 -      Assessment:  #1.   History of epigastric/right upper quadrant abdominal pain.  Symptoms not typical of biliary tract disease.  However he does have gallbladder polyp and is due for follow-up exam.  It remains to be seen if his intermittent symptoms are due to gallbladder disease.  If pain becomes more frequent and associated with nausea and vomiting will consider further evaluation  #2.  History of gallbladder polyp.  Gallbladder polyp measured 7 mm on the study 1 year ago.  He is due for follow-up exam.   Plan:  Right upper quadrant abdominal ultrasound. Patient will call if he has change in his symptoms. Office visit in 1 year.

## 2019-11-15 NOTE — Patient Instructions (Signed)
Physician will call results of ultrasound when completed

## 2019-11-18 ENCOUNTER — Other Ambulatory Visit: Payer: Self-pay

## 2019-11-18 ENCOUNTER — Ambulatory Visit (HOSPITAL_COMMUNITY)
Admission: RE | Admit: 2019-11-18 | Discharge: 2019-11-18 | Disposition: A | Discharge status: Home / Self Care | Payer: BC Managed Care – PPO | Source: Ambulatory Visit | Attending: Internal Medicine | Admitting: Internal Medicine

## 2019-11-18 DIAGNOSIS — K824 Cholesterolosis of gallbladder: Secondary | ICD-10-CM | POA: Diagnosis not present

## 2019-11-18 DIAGNOSIS — R1011 Right upper quadrant pain: Secondary | ICD-10-CM | POA: Insufficient documentation

## 2019-12-13 ENCOUNTER — Other Ambulatory Visit: Payer: Self-pay

## 2019-12-13 ENCOUNTER — Encounter: Payer: Self-pay | Admitting: Family Medicine

## 2019-12-13 ENCOUNTER — Ambulatory Visit: Payer: BC Managed Care – PPO | Admitting: Family Medicine

## 2019-12-13 VITALS — BP 122/74 | HR 83 | Temp 97.1°F | Ht 71.0 in | Wt 245.0 lb

## 2019-12-13 DIAGNOSIS — R42 Dizziness and giddiness: Secondary | ICD-10-CM

## 2019-12-13 DIAGNOSIS — M7702 Medial epicondylitis, left elbow: Secondary | ICD-10-CM

## 2019-12-13 DIAGNOSIS — G479 Sleep disorder, unspecified: Secondary | ICD-10-CM | POA: Diagnosis not present

## 2019-12-13 NOTE — Progress Notes (Signed)
   Subjective:    Patient ID: Justin Estrada, male    DOB: Jan 12, 1966, 54 y.o.   MRN: [id number]  HPIfollow up on sleep issues and dizziness.  Patient has had intermittent times where he feels slight lightheadedness as well as a pressure feeling in the front part of his sinuses this lasts for anywhere from 10 to 30 seconds to sometimes several minutes denies any double vision denies any nausea or vomiting.  Sometimes this is once a day sometimes several times a day sometimes does not happen at all.  It occurs sometimes in the morning hours does not wake him up at night no unilateral numbness or weakness.  No nausea vomiting or diarrhea.  Moods are doing good denies being stressed denies feeling anxious  Patient with sleep issues with frequent awakenings having trouble with staying asleep.  At times states he tosses and turns denies restless legs.  Denies breathing difficulties no shortness of breath does relate fatigue from waking up so many times denies feeling anxious keeps caffeine to a minimum never has it beyond lunchtime tries to get up every day at the same time tries to stay active.  Patient mildly overweight working on [employer] and walking.  Left elbow intermittent inner left elbow pain medial aspect present with certain movements.  Present for about a week  Review of Systems See above    Objective:   Physical Exam Lungs clear respiratory rate normal heart regular no murmurs abdomen is soft Left internal elbow medial aspect tender consistent with medial epicondylitis Neurologic grossly normal.  Blood pressure laying sitting standing normal.       Assessment & Plan:  1. Lightheaded I do not find any evidence of any neuro issues of severe nature currently.  I do not want to put him on any daily medicine currently because it is unlikely that this will necessarily help He has had a previous MRI I would not recommend further imaging at this time I will reach out to the neurologist  that he saw to see if they have any input Also recommend healthy diet regular activity No lab work today Patient will keep a diary of his symptoms and send it to [place] in several weeks  2. Sleep disturbance He had a normal sleep study AHI negative for any sleep apnea. His frequent awakenings hard to know if this is just age-related or a sign of disturbance patient denies being anxious or stressed or depressed. Currently I do not want to put him on any medication I will reach out to a sleep specialist to see if any other recommendations As for the approach currently I believe the best approach would be behavioral approaches  3. Medial epicondylitis of left elbow Mild medial epicondylitis recommend Voltaren gel as needed follow-up if ongoing troubles cool compresses frequently stretching exercises notify us if ongoing issues

## 2020-01-02 ENCOUNTER — Encounter: Payer: Self-pay | Admitting: Family Medicine

## 2020-01-04 ENCOUNTER — Encounter: Payer: Self-pay | Admitting: Family Medicine

## 2020-01-23 ENCOUNTER — Encounter: Payer: Self-pay | Admitting: Family Medicine

## 2020-01-23 ENCOUNTER — Other Ambulatory Visit: Payer: Self-pay | Admitting: *Deleted

## 2020-01-23 DIAGNOSIS — G4730 Sleep apnea, unspecified: Secondary | ICD-10-CM

## 2020-01-23 DIAGNOSIS — G479 Sleep disorder, unspecified: Secondary | ICD-10-CM

## 2020-01-23 NOTE — Addendum Note (Signed)
Addended by: Marlowe Shores on: 01/23/2020 03:05 PM   Modules accepted: Orders

## 2020-01-23 NOTE — Telephone Encounter (Signed)
I will put in order for sleep study since pt did not hear back from the order put in back in June. But I do not see referral to neurology. Do you want to put in referral to a specific dr. Note in October states you will reach out to who he saw in the past.

## 2020-01-23 NOTE — Telephone Encounter (Signed)
Nurses Please put in a consult for this patient for Dr.Saima Athar. I consulted with her a couple weeks ago and she sent back a reply stating that she feels the next best step would be for her to evaluate him to see if he needs a specialized sleep study  Please go ahead with this consultation and please notify [name] thank you

## 2020-01-30 ENCOUNTER — Ambulatory Visit: Payer: BC Managed Care – PPO | Admitting: Neurology

## 2020-01-30 ENCOUNTER — Encounter: Payer: Self-pay | Admitting: Neurology

## 2020-01-30 ENCOUNTER — Other Ambulatory Visit: Payer: Self-pay

## 2020-01-30 VITALS — BP 150/100 | HR 63 | Ht 71.0 in | Wt 246.0 lb

## 2020-01-30 DIAGNOSIS — R030 Elevated blood-pressure reading, without diagnosis of hypertension: Secondary | ICD-10-CM | POA: Diagnosis not present

## 2020-01-30 DIAGNOSIS — G4700 Insomnia, unspecified: Secondary | ICD-10-CM | POA: Diagnosis not present

## 2020-01-30 DIAGNOSIS — R0683 Snoring: Secondary | ICD-10-CM

## 2020-01-30 DIAGNOSIS — G478 Other sleep disorders: Secondary | ICD-10-CM

## 2020-01-30 DIAGNOSIS — Z9189 Other specified personal risk factors, not elsewhere classified: Secondary | ICD-10-CM | POA: Diagnosis not present

## 2020-01-30 DIAGNOSIS — E669 Obesity, unspecified: Secondary | ICD-10-CM

## 2020-01-30 DIAGNOSIS — Z820 Family history of epilepsy and other diseases of the nervous system: Secondary | ICD-10-CM

## 2020-01-30 DIAGNOSIS — G4719 Other hypersomnia: Secondary | ICD-10-CM

## 2020-01-30 DIAGNOSIS — G4763 Sleep related bruxism: Secondary | ICD-10-CM

## 2020-01-30 NOTE — Patient Instructions (Signed)
Thank you for choosing [facility] for your sleep related care! It was nice to meet you today! I appreciate that you entrust me with your sleep related healthcare concerns. I hope, I was able to address at least some of your concerns today, and that I can help you feel reassured and also get better.    Here is what we discussed today and what we came up with as our plan for you:    Based on your symptoms and your exam I believe you are at risk for obstructive sleep apnea (aka OSA), and I think we should proceed with a sleep study to determine whether you do or do not have OSA and how severe it is. Even, if you have mild OSA, I may want you to consider treatment with CPAP, as treatment of even borderline or mild sleep apnea can result and improvement of symptoms such as sleep disruption, daytime sleepiness, nighttime bathroom breaks, restless leg symptoms, improvement of headache syndromes, even improved mood disorder.   As explained, an attended sleep study meaning you get to stay overnight in the sleep lab, lets [place] monitor sleep-related behaviors such as sleep talking and leg movements in sleep, in addition to monitoring for sleep apnea.  A home sleep test is a screening tool for sleep apnea only, and unfortunately does not help with any other sleep-related diagnoses.  Since you had a recent negative sleep study in the sleep lab in August 2021, I recommend that we pursue a home sleep test at this time.  It may help you sleep a little easier at home.   Please remember, the long-term risks and ramifications of untreated moderate to severe obstructive sleep apnea are: increased Cardiovascular disease, including congestive heart failure, stroke, difficult to control hypertension, treatment resistant obesity, arrhythmias, especially irregular heartbeat commonly known as A. Fib. (atrial fibrillation); even type 2 diabetes has been linked to untreated OSA.   Sleep apnea can cause disruption  of sleep and sleep deprivation in most cases, which, in turn, can cause recurrent headaches, problems with memory, mood, concentration, focus, and vigilance. Most people with untreated sleep apnea report excessive daytime sleepiness, which can affect their ability to drive. Please do not drive if you feel sleepy. Patients with sleep apnea can also develop difficulty initiating and maintaining sleep (aka insomnia).   Having sleep apnea may increase your risk for other sleep disorders, including involuntary behaviors sleep such as sleep terrors, sleep talking, sleepwalking.    Having sleep apnea can also increase your risk for restless leg syndrome and leg movements at night.   Please note that untreated obstructive sleep apnea may carry additional perioperative morbidity. Patients with significant obstructive sleep apnea (typically, in the moderate to severe degree) should receive, if possible, perioperative PAP (positive airway pressure) therapy and the surgeons and particularly the anesthesiologists should be informed of the diagnosis and the severity of the sleep disordered breathing.   I will likely see you back after your sleep study to go over the test results and where to go from there. We will call you after your sleep study to advise about the results (most likely, you will hear from [facility], my nurse) and to set up an appointment at the time, as necessary.    Our sleep lab administrative assistant will call you to schedule your sleep study and give you further instructions, regarding the check in process for the sleep study, arrival time, what to bring, when you can expect to leave after  the study, etc., and to answer any other logistical questions you may have. If you don't hear back from her by about 2 weeks from now, please feel free to call her direct line at [phone] or you can call our general clinic number, or email [place] through My Chart.

## 2020-01-30 NOTE — Progress Notes (Signed)
Subjective:    Patient ID: Justin Estrada is a 54 y.o. male.  HPI     Huston Foley, MD, PhD [facility] [address] [address] [place], [place] [address]  Dear Dr. Gerda Diss,   I saw your patient, Justin Estrada, upon your kind request in the sleep clinic today for initial consultation of his sleep disorder, in particular, sleep disruption and difficulty maintaining sleep at night, daytime somnolence.  The patient is unaccompanied today.  As you know Justin Estrada is a 54 year old right-handed gentleman with an underlying medical history of low back pain and obesity, who reports chronic difficulty with a past 2 years at least with sleep maintenance difficulty.  He goes to sleep fairly well but wakes up in the middle of the night for no apparent reason at times.  He denies any significant pain or telltale restless leg syndrome or sensation of gasping for air.  He does snore.  His father had sleep apnea and had a CPAP machine.  I reviewed your office note from 12/13/2019.  His Epworth sleepiness score is 12 out of 24, fatigue severity score is 27 out of 63.  He had a baseline sleep study on 10/05/2019 and I reviewed the results: Sleep efficiency was 67.2%, sleep latency 5.9 minutes, REM latency 171 minutes.  He had an increased percentage of stage II sleep and a reduced percentage of REM sleep at 10.9%.  Total AHI was 0/h.  He had no significant PLM's.  Average oxygen saturation was 92.3%, nadir was 82%. Interpreting physician was Dr. Beryle Beams. He denies being sleepy for years, does not have any history of parasomnias such as sleepwalking or sleep talking and denies any vivid dreams or dream enactment behavior, in fact, does not recall drinking very much at all. He goes to bed between 10 and 11 and rise time is around 5:15 AM.  He teaches at [employer].  He tried melatonin which did not help.  He limits his caffeine to 1 cup of coffee in the morning and  an occasional second caffeine-containing drink in the afternoon.  He does have a history of bruxism and has a dentist may bite guard.  He does not typically watch TV in the bedroom.  They have 1 puppy in the household.  He lives with his family which includes his wife and 2 grown children, 55 year old daughter and 35 year old son.  He denies any recent changes in his medication regimen but has had stressors in the past 2 to 3 years.  He has rare morning headaches and no night to night nocturia.  He admits that he did not sleep very well at the time of his sleep study.  His Past Medical History Is Significant For: Past Medical History:  Diagnosis Date  . Anal fissure   . Chest pain, atypical 08/2017   Normal stress echo    His Past Surgical History Is Significant For: Past Surgical History:  Procedure Laterality Date  . BIOPSY  10/12/2017   Procedure: BIOPSY;  Surgeon: Malissa Hippo, MD;  Location: [facility];  Service: Endoscopy;;  antral biopsy   . COLONOSCOPY     10 plus years ago by Dr.Rourk  . COLONOSCOPY N/A 07/28/2016   Procedure: COLONOSCOPY;  Surgeon: Malissa Hippo, MD;  Location: [facility];  Service: Endoscopy;  Laterality: N/A;  730-rescheduled to 6/4 same time per Dewayne Hatch  . ESOPHAGOGASTRODUODENOSCOPY N/A 10/12/2017   Procedure: ESOPHAGOGASTRODUODENOSCOPY (EGD);  Surgeon: Karilyn Cota,  Joline Maxcy, MD;  Location: [facility];  Service: Endoscopy;  Laterality: N/A;  730  . EXCISION OF SKIN TAG N/A 09/30/2017   Procedure: EXCISION OF PERIANAL SKIN TAGS;  Surgeon: Lucretia Roers, MD;  Location: [facility] OR[facility];  Service: General;  Laterality: N/A;  . foot surgery  for bone spurs Bilateral   . LEFT HEART CATH AND CORONARY ANGIOGRAPHY N/A 03/11/2019   Procedure: LEFT HEART CATH AND CORONARY ANGIOGRAPHY;  Surgeon: Lyn Records, MD;  Location: [facility];  Service: Cardiovascular;  Laterality: N/A;  . UPPER GASTROINTESTINAL ENDOSCOPY     10 plus years ago by Dr.Rourk    His Family  History Is Significant For: Family History  Problem Relation Age of Onset  . Hypertension Mother   . Lung cancer Mother   . Liver cancer Mother   . Hypertension Father   . Congestive Heart Failure Father   . Atrial fibrillation Brother     His Social History Is Significant For: Social History   Socioeconomic History  . Marital status: Married    Spouse name: Jama  . Number of children: 2  . Years of education: 16  . Highest education level: Bachelor's degree (e.g., BA, AB, BS)  Occupational History  . Occupation: Magazine features editor: [employer]  Tobacco Use  . Smoking status: Never Smoker  . Smokeless tobacco: Never Used  Vaping Use  . Vaping Use: Never used  Substance and Sexual Activity  . Alcohol use: No    Alcohol/week: 0.0 standard drinks  . Drug use: No  . Sexual activity: Yes    Birth control/protection: None  Other Topics Concern  . Not on file  Social History Narrative   Patient is right-handed. He lives with his wife and 2 children in a 2 level home with a  Basement. He drinks two cups of coffee and one 16 oz bottle of soda daily. He walks occasionally.   Social Determinants of Health   Financial Resource Strain:   . Difficulty of Paying Living Expenses: Not on file  Food Insecurity:   . Worried About Programme researcher, broadcasting/film/video in the Last Year: Not on file  . Ran Out of Food in the Last Year: Not on file  Transportation Needs:   . Lack of Transportation (Medical): Not on file  . Lack of Transportation (Non-Medical): Not on file  Physical Activity:   . Days of Exercise per Week: Not on file  . Minutes of Exercise per Session: Not on file  Stress:   . Feeling of Stress : Not on file  Social Connections:   . Frequency of Communication with Friends and Family: Not on file  . Frequency of Social Gatherings with Friends and Family: Not on file  . Attends Religious Services: Not on file  . Active Member of Clubs or Organizations: Not on file  .  Attends Banker Meetings: Not on file  . Marital Status: Not on file    His Allergies Are:  No Known Allergies:   His Current Medications Are:  Outpatient Encounter Medications as of 01/30/2020  Medication Sig  . diltiazem 2 % GEL Apply 1 application topically at bedtime as needed (anal fissure). Use as directed.  . docusate sodium (COLACE) 100 MG capsule Take 1 capsule (100 mg total) by mouth 2 (two) times daily. (Patient taking differently: Take 100 mg by mouth at bedtime. )  . psyllium (METAMUCIL SMOOTH TEXTURE) 28 % packet Take 1  packet by mouth at bedtime.   No facility-administered encounter medications on file as of 01/30/2020.  :  Review of Systems:  Out of a complete 14 point review of systems, all are reviewed and negative with the exception of these symptoms as listed below: Review of Systems  Neurological:       Pt presents today to discuss his sleep. Pt had a PSG in August and it was negative for osa, per pt's report. Pt does endorse snoring.  Epworth Sleepiness Scale 0= would never doze 1= slight chance of dozing 2= moderate chance of dozing 3= high chance of dozing  Sitting and reading: 2 Watching TV: 3 Sitting inactive in a public place (ex. Theater or meeting): 1 As a passenger in a car for an hour without a break: 2 Lying down to rest in the afternoon: 3 Sitting and talking to someone: 0 Sitting quietly after lunch (no alcohol): 1 In a car, while stopped in traffic: 0 Total: 12     Objective:  Neurological Exam  Physical Exam Physical Examination:   Vitals:   01/30/20 1343  BP: (!) 150/100  Pulse: 63    General Examination: The patient is a very pleasant 54 y.o. male in no acute distress. He appears well-developed and well-nourished and well groomed.   HEENT: Normocephalic, atraumatic, pupils are equal, round and reactive to light, extraocular tracking is good without limitation to gaze excursion or nystagmus noted. Hearing is  grossly intact. Face is symmetric with normal facial animation. Speech is clear with no dysarthria noted. There is no hypophonia. There is no lip, neck/head, jaw or voice tremor. Neck is supple with full range of passive and active motion. There are no carotid bruits on auscultation. Oropharynx exam reveals: mild mouth dryness, adequate dental hygiene and mild airway crowding, due to small airway entry.  Tonsils are on the smaller side, Mallampati class II, neck circumference of 19-1/8 inches.  He has a minimal overbite.  Tongue protrudes centrally and palate elevates symmetrically.  Chest: Clear to auscultation without wheezing, rhonchi or crackles noted.  Heart: S1+S2+0, regular and normal without murmurs, rubs or gallops noted.   Abdomen: Soft, non-tender and non-distended with normal bowel sounds appreciated on auscultation.  Extremities: There is no pitting edema in the distal lower extremities bilaterally.   Skin: Warm and dry without trophic changes noted.   Musculoskeletal: exam reveals no obvious joint deformities, tenderness or joint swelling or erythema.   Neurologically:  Mental status: The patient is awake, alert and oriented in all 4 spheres. His immediate and remote memory, attention, language skills and fund of knowledge are appropriate. There is no evidence of aphasia, agnosia, apraxia or anomia. Speech is clear with normal prosody and enunciation. Thought process is linear. Mood is normal and affect is normal.  Cranial nerves II - XII are as described above under HEENT exam.  Motor exam: Normal bulk, strength and tone is noted. There is no tremor, Romberg is negative. Fine motor skills and coordination: grossly intact.  Cerebellar testing: No dysmetria or intention tremor. There is no truncal or gait ataxia.  Sensory exam: intact to light touch in the upper and lower extremities.  Gait, station and balance: He stands easily. No veering to one side is noted. No leaning to one  side is noted. Posture is age-appropriate and stance is narrow based. Gait shows normal stride length and normal pace. No problems turning are noted. Tandem walk is unremarkable.  Assessment and Plan:   In summary, NORIEL GUTHRIE is a very pleasant 54 y.o.-year old male  with an underlying medical history of low back pain and obesity, who presents for evaluation of his sleep disturbance, ongoing concern for underlying obstructive sleep apnea despite a laboratory attended sleep study negative in August 2021.  He did not sleep very well that night and it is possible that there was an underestimation of his sleep disordered breathing.  He did have oxygen desaturations at times into the lower 80s.  Soft intermittent snoring was noted at the time, he did not have any supine sleep.  I would recommend reevaluation with a home sleep test.  His history is not suggestive of an underlying significant hypersomnolence disorder, such as narcolepsy.   I had a long chat with the patient about my findings and the diagnosis of OSA, its prognosis and treatment options. We talked about medical treatments, surgical interventions and non-pharmacological approaches. I explained in particular the risks and ramifications of untreated moderate to severe OSA, especially with respect to developing cardiovascular disease down the Road, including congestive heart failure, difficult to treat hypertension, cardiac arrhythmias, or stroke. Even type 2 diabetes has, in part, been linked to untreated OSA. Symptoms of untreated OSA include daytime sleepiness, memory problems, mood irritability and mood disorder such as depression and anxiety, lack of energy, as well as recurrent headaches, especially morning headaches. We talked about trying to maintain a healthy lifestyle in general, as well as the importance of weight control. We also talked about the importance of good sleep hygiene. I recommended the following at this time: sleep  study.   I explained the sleep test procedure to the patient and also outlined possible surgical and non-surgical treatment options of OSA, including the use of a custom-made dental device (which would require a referral to a specialist dentist or oral surgeon), upper airway surgical options, such as traditional UPPP or a novel less invasive surgical option in the form of Inspire hypoglossal nerve stimulation (which would involve a referral to an ENT surgeon). I also explained the CPAP treatment option to the patient, who indicated that he would be willing to try CPAP or AutoPap if the need arises. I answered all his questions today and the patient was in agreement. I plan to see him back after the sleep study is completed and encouraged him to call with any interim questions, concerns, problems or updates.   Thank you very much for allowing me to participate in the care of this nice patient. If I can be of any further assistance to you please do not hesitate to call me at [phone].  Sincerely,   Huston Foley, MD, PhD

## 2020-02-01 ENCOUNTER — Encounter: Payer: Self-pay | Admitting: Family Medicine

## 2020-02-01 ENCOUNTER — Other Ambulatory Visit: Payer: Self-pay

## 2020-02-01 ENCOUNTER — Ambulatory Visit (INDEPENDENT_AMBULATORY_CARE_PROVIDER_SITE_OTHER): Payer: BC Managed Care – PPO | Admitting: Family Medicine

## 2020-02-01 DIAGNOSIS — J019 Acute sinusitis, unspecified: Secondary | ICD-10-CM

## 2020-02-01 DIAGNOSIS — Z23 Encounter for immunization: Secondary | ICD-10-CM

## 2020-02-01 MED ORDER — AMOXICILLIN-POT CLAVULANATE 875-125 MG PO TABS: 1.0000 | ORAL_TABLET | Freq: Two times a day (BID) | ORAL | 0 refills | Status: DC

## 2020-02-01 NOTE — Telephone Encounter (Signed)
Called and scheduled appt for today.  °

## 2020-02-01 NOTE — Telephone Encounter (Signed)
Nurses Please reach out to Justin Estrada him know I reviewed over his note We can help I can do a virtual visit with the patient or a in person visit with the patient at 4:20 PM-4:30 PM Please connect with patient and see what he would prefer thank you

## 2020-02-01 NOTE — Progress Notes (Signed)
   Subjective:    Patient ID: Justin Estrada, male    DOB: 02/18/1966, 54 y.o.   MRN: [id number]  HPI Significant head congestion drainage coughing sinus pressure past for 5 days had a rapid test at home having ongoing troubles with sinus pressure pain discomfort rapid Covid test was negative he has been working the past several days denies wheezing difficulty breathing has had his Covid vaccine previously   Review of Systems Please see above    Objective:   Physical Exam  Lungs clear respiratory rate normal mild sinus tenderness eardrums normal neck no masses Flu shot today patient not running fever flu vaccine was given in the left deltoid without troubles proper aspiration and sterile technique used     Assessment & Plan:  Acute rhinosinusitis antibiotic prescribed warning signs discussed follow-up if progressive troubles or worse

## 2020-02-08 ENCOUNTER — Telehealth: Payer: Self-pay

## 2020-02-08 NOTE — Telephone Encounter (Signed)
LVM for pt to call me back to schedule sleep study  

## 2020-02-13 ENCOUNTER — Other Ambulatory Visit: Payer: Self-pay

## 2020-02-13 ENCOUNTER — Ambulatory Visit: Payer: BC Managed Care – PPO | Admitting: Family Medicine

## 2020-02-13 ENCOUNTER — Encounter: Payer: Self-pay | Admitting: Family Medicine

## 2020-02-13 VITALS — BP 134/86 | HR 67 | Temp 97.6°F | Wt 247.8 lb

## 2020-02-13 DIAGNOSIS — Z131 Encounter for screening for diabetes mellitus: Secondary | ICD-10-CM

## 2020-02-13 DIAGNOSIS — M792 Neuralgia and neuritis, unspecified: Secondary | ICD-10-CM

## 2020-02-13 DIAGNOSIS — Z1329 Encounter for screening for other suspected endocrine disorder: Secondary | ICD-10-CM

## 2020-02-13 NOTE — Progress Notes (Signed)
   Subjective:    Patient ID: Justin Estrada, male    DOB: 1965/09/17, 54 y.o.   MRN: [id number]  HPI Patient has concerns of a tingling/burning feeling of pins and needles in his left toes x several months and gradually getting worse.  He describes this as a tingling in the toes as well as a discomfort that comes and goes worse in the evening time worse when his shoes are on denies any injury to his foot denies any weakness to the leg. Review of Systems  Constitutional: Negative for activity change.  HENT: Negative for congestion and rhinorrhea.   Respiratory: Negative for cough and shortness of breath.   Cardiovascular: Negative for chest pain.  Gastrointestinal: Negative for abdominal pain, diarrhea, nausea and vomiting.  Genitourinary: Negative for dysuria and hematuria.  Neurological: Negative for weakness and headaches.  Psychiatric/Behavioral: Negative for behavioral problems and confusion.       Objective:   Physical Exam Lungs clear respiratory rate normal heart regular chest wall nontender  Foot normal monofilament normal strength normal pulses     Assessment & Plan:  Neuralgia in the foot recommend lab testing await the results I would not recommend nerve conduction studies currently if progressive troubles or worse follow-up  Sharp chest pains intermittent I doubt these are cardiac next step would be referral to physical medicine sports medicine potentially Dr. [name] await patient's input regarding this he states he will let us now

## 2020-02-16 LAB — VITAMIN B12: Vitamin B-12: 611 pg/mL (ref 232–1245)

## 2020-02-16 LAB — TSH: TSH: 1.38 u[IU]/mL (ref 0.450–4.500)

## 2020-02-16 LAB — GLUCOSE, RANDOM: Glucose: 97 mg/dL (ref 65–99)

## 2020-02-23 ENCOUNTER — Encounter: Payer: Self-pay | Admitting: Family Medicine

## 2020-02-27 ENCOUNTER — Other Ambulatory Visit: Payer: Self-pay | Admitting: *Deleted

## 2020-02-27 DIAGNOSIS — R079 Chest pain, unspecified: Secondary | ICD-10-CM

## 2020-02-27 NOTE — Telephone Encounter (Signed)
Nurses-the doctor who I would like for him to see is Dr. [name] sports medicine for further evaluation  If Dr. [name] is not on his insurance then physical medicine specialist Dr. Fritzi Mandes

## 2020-02-29 ENCOUNTER — Ambulatory Visit (INDEPENDENT_AMBULATORY_CARE_PROVIDER_SITE_OTHER): Payer: BC Managed Care – PPO

## 2020-02-29 ENCOUNTER — Other Ambulatory Visit: Payer: Self-pay

## 2020-02-29 ENCOUNTER — Ambulatory Visit (INDEPENDENT_AMBULATORY_CARE_PROVIDER_SITE_OTHER): Payer: BC Managed Care – PPO | Admitting: Podiatry

## 2020-02-29 DIAGNOSIS — M79671 Pain in right foot: Secondary | ICD-10-CM | POA: Diagnosis not present

## 2020-02-29 DIAGNOSIS — M7671 Peroneal tendinitis, right leg: Secondary | ICD-10-CM | POA: Diagnosis not present

## 2020-02-29 MED ORDER — TRIAMCINOLONE ACETONIDE 10 MG/ML IJ SUSP
10.0000 mg | Freq: Once | INTRAMUSCULAR | Status: AC
  Administered 2020-02-29: 10 mg

## 2020-03-01 NOTE — Progress Notes (Signed)
Subjective:   Patient ID: Justin Estrada, male   DOB: 55 y.o.   MRN: [id number]   HPI Patient states he is developed a lot of pain in the outside of his right foot over the last month and does not remember injury.  The other area we worked on has done very well from about 5 months ago neuro   ROS      Objective:  Physical Exam  Vascular status intact with patient found to have inflammation pain of the peroneal group right with history of hallux limitus surgery which is done very well bilateral     Assessment:  Peroneal tendinitis right with inflammation     Plan:  H&P precautionary x-ray done went ahead did sterile prep injected the tendon complex right 3 mg Kenalog 5 mg Xylocaine advised on ice therapy reduced activity and this should heal uneventfully  X-rays indicate history of hallux limitus surgery right with the patient found to have no indication stress fracture arthritis lateral side right foot

## 2020-03-05 ENCOUNTER — Ambulatory Visit (INDEPENDENT_AMBULATORY_CARE_PROVIDER_SITE_OTHER): Payer: BC Managed Care – PPO | Admitting: Neurology

## 2020-03-05 DIAGNOSIS — E669 Obesity, unspecified: Secondary | ICD-10-CM

## 2020-03-05 DIAGNOSIS — G4719 Other hypersomnia: Secondary | ICD-10-CM

## 2020-03-05 DIAGNOSIS — G478 Other sleep disorders: Secondary | ICD-10-CM

## 2020-03-05 DIAGNOSIS — Z9189 Other specified personal risk factors, not elsewhere classified: Secondary | ICD-10-CM

## 2020-03-05 DIAGNOSIS — G4733 Obstructive sleep apnea (adult) (pediatric): Secondary | ICD-10-CM

## 2020-03-05 DIAGNOSIS — Z820 Family history of epilepsy and other diseases of the nervous system: Secondary | ICD-10-CM

## 2020-03-05 DIAGNOSIS — R030 Elevated blood-pressure reading, without diagnosis of hypertension: Secondary | ICD-10-CM

## 2020-03-05 DIAGNOSIS — G4700 Insomnia, unspecified: Secondary | ICD-10-CM

## 2020-03-05 DIAGNOSIS — R0683 Snoring: Secondary | ICD-10-CM

## 2020-03-05 DIAGNOSIS — G4763 Sleep related bruxism: Secondary | ICD-10-CM

## 2020-03-06 NOTE — Progress Notes (Signed)
° °

## 2020-03-07 NOTE — Procedures (Signed)
   [facility]  HOME SLEEP TEST (Watch PAT)  STUDY DATE: 03/05/20  DOB: 05/19/1965  MRN: [id number]  ORDERING CLINICIAN: Huston Foley, MD, PhD   REFERRING CLINICIAN: Babs Sciara, MD   CLINICAL INFORMATION/HISTORY: 55 year old man with a history of low back pain and obesity, who reports chronic difficulty with sleep maintenance. He goes to sleep fairly well but wakes up in the middle of the night for no apparent reason at times.  He denies any significant pain or telltale restless leg syndrome or sensation of gasping for air.  He does snore.    He had a baseline sleep study in August 2021 which showed an AHI of 0/h.  Epworth sleepiness score: 12/24.  BMI: 34.6kg/m  FINDINGS:   Total Record Time (hours, min): 7hr  Total Sleep Time (hours, min):  6hr   Percent REM (%):    36.7   Calculated pAHI (per hour):  3.9     REM pAHI: 9.8   NREM pAHI: 0.5  Supine AHI: 4.4   Oxygen Saturation (%) Mean: 96 Minimum oxygen saturation (%):  88   O2 Saturation Range (%): 88 - 99  O2Saturation (minutes) <=88%  0.0  Pulse Mean (bpm): 57  Pulse Range (48 - 95)   IMPRESSION: Primary snoring  RECOMMENDATION:  This home sleep test does not demonstrate any significant obstructive or central sleep disordered breathing.  Mild intermittent snoring was noted. Other causes of the patient's symptoms, including circadian rhythm disturbances, an underlying mood disorder, medication effect and/or an underlying medical problem cannot be ruled out based on this test. Clinical correlation is recommended. The patient should be cautioned not to drive, work at heights, or operate dangerous or heavy equipment when tired or sleepy. Review and reiteration of good sleep hygiene measures should be pursued with any patient. The patient can follow up with his referring provider, who will be notified of the test results.   I certify that I have reviewed the raw data recording prior to the  issuance of this report in accordance with the standards of the American Academy of Sleep Medicine (AASM).   INTERPRETING PHYSICIAN:    Huston Foley, MD, PhD  Board Certified in Neurology and Sleep Medicine  [facility] [address] [place], [place] [address] [phone]

## 2020-03-07 NOTE — Progress Notes (Signed)
Patient referred by Dr. Gerda Diss for re-eval of his sleep disturbance, after a neg PSG with Dr. Gerilyn Pilgrim in August 2021. I saw him on 01/30/20, HST on 03/05/20.   Please call and notify the patient that the recent home sleep test did not show any significant obstructive sleep apnea.  He had mild intermittent snoring.  At this juncture, given that he had 2 sleep studies in the recent past, we can exclude obstructive sleep apnea as a cause of his sleep disruption.  I would recommend follow-up with Dr. Gerda Diss as scheduled.  He can try Melatonin at night for sleep: take 1 mg to 3 mg, one to 2 hours before your bedtime. He can go up to 5 mg if needed. It is over the counter and comes in pill form, chewable form and spray.

## 2020-03-09 ENCOUNTER — Encounter: Payer: Self-pay | Admitting: Family Medicine

## 2020-03-09 NOTE — Telephone Encounter (Signed)
Nurses Please assist Todd  Please also forward the message to him Hi Todd-sorry your family has sickness.  Breakthrough infections are becoming very common.  The good news is that most people do not get severe illness.  Antibiotics truly do not do anything for this.  If over the next 5 to 7 days your symptoms progress or get worse I can always see you or do a virtual visit or send you in some medicine but currently right now I would not recommend medication. More than likely everybody has a virus.  Although the likelihood of this being COVID among each of you is quite possible not everybody may have the same illness.  Nonetheless, I would recommend self-isolation for everybody at least 5 days.  Hope this helps in regards to the information thanks-Scott Additional information regarding COVID is as follows:   Covid infection This is a viral process.  Mild cases are treated with supportive measures at home such as Tylenol rest fluids.  In some situations monoclonal antibodies may be appropriate depending on the patient's risk criteria.  The patient was educated regarding progressive illness including respiratory, persistent vomiting, change in mental status.  If any of these occur ER evaluation is recommended. Patient was educated about the following as well Covid-19 respiratory warning: Covid-19 is a virus that causes hypoxia (low oxygen level in blood) in some people. If you develop any changes in your usual breathing pattern: difficulty catching your breath, more short winded with activity or with resting, or anything that concerns you about your breathing, do not hesitate to go to the emergency department immediately for evaluation. Please do not delay to get treatment.   Agrees with plan of care discussed today. Understands warning signs to seek further care: Chest pain, shortness of breath, mental confusion, profuse vomiting, any significant change in health. Understands to follow-up if  symptoms do not improve, or worsen.

## 2020-03-13 ENCOUNTER — Telehealth: Payer: Self-pay

## 2020-03-13 NOTE — Telephone Encounter (Signed)
-----   Message from Huston Foley, MD sent at 03/07/2020  4:54 PM EST ----- Patient referred by Dr. Gerda Diss for re-eval of his sleep disturbance, after a neg PSG with Dr. Gerilyn Pilgrim in August 2021. I saw him on 01/30/20, HST on 03/05/20.   Please call and notify the patient that the recent home sleep test did not show any significant obstructive sleep apnea.  He had mild intermittent snoring.  At this juncture, given that he had 2 sleep studies in the recent past, we can exclude obstructive sleep apnea as a cause of his sleep disruption.  I would recommend follow-up with Dr. Gerda Diss as scheduled.  He can try Melatonin at night for sleep: take 1 mg to 3 mg, one to 2 hours before your bedtime. He can go up to 5 mg if needed. It is over the counter and comes in pill form, chewable form and spray.

## 2020-03-13 NOTE — Telephone Encounter (Signed)
I called patient to discuss.  No answer, left a voicemail asking him to call us back. 

## 2020-03-14 ENCOUNTER — Encounter: Payer: Self-pay | Admitting: Family Medicine

## 2020-03-14 NOTE — Telephone Encounter (Signed)
I called the pt back and we reviewed his results. He verbalized understanding. Report has been sent to Dr. Gerda Diss.

## 2020-03-14 NOTE — Telephone Encounter (Signed)
Pt returned phone call. Nurse will call Pt back.

## 2020-03-20 ENCOUNTER — Ambulatory Visit: Payer: BC Managed Care – PPO | Admitting: Family Medicine

## 2020-03-21 ENCOUNTER — Ambulatory Visit: Payer: BC Managed Care – PPO | Admitting: Cardiology

## 2020-03-21 ENCOUNTER — Other Ambulatory Visit: Payer: Self-pay

## 2020-03-21 ENCOUNTER — Encounter: Payer: Self-pay | Admitting: Cardiology

## 2020-03-21 VITALS — BP 134/82 | HR 72 | Ht 71.0 in | Wt 245.0 lb

## 2020-03-21 DIAGNOSIS — Z87898 Personal history of other specified conditions: Secondary | ICD-10-CM

## 2020-03-21 NOTE — Patient Instructions (Signed)
Medication Instructions:  Your physician recommends that you continue on your current medications as directed. Please refer to the Current Medication list given to you today.  *If you need a refill on your cardiac medications before your next appointment, please call your pharmacy*   Lab Work: NONE   If you have labs (blood work) drawn today and your tests are completely normal, you will receive your results only by: MyChart Message (if you have MyChart) OR A paper copy in the mail If you have any lab test that is abnormal or we need to change your treatment, we will call you to review the results.   Testing/Procedures: NONE    Follow-Up: At [facility], you and your health needs are our priority.  As part of our continuing mission to provide you with exceptional heart care, we have created designated Provider Care Teams.  These Care Teams include your primary Cardiologist (physician) and Advanced Practice Providers (APPs -  Physician Assistants and Nurse Practitioners) who all work together to provide you with the care you need, when you need it.  We recommend signing up for the patient portal called "MyChart".  Sign up information is provided on this After Visit Summary.  MyChart is used to connect with patients for Virtual Visits (Telemedicine).  Patients are able to view lab/test results, encounter notes, upcoming appointments, etc.  Non-urgent messages can be sent to your provider as well.   To learn more about what you can do with MyChart, go to [email or web address].    Your next appointment:   1 year(s)  The format for your next appointment:   In Person  Provider:   Samuel McDowell, MD   Other Instructions Thank you for choosing [facility]!    

## 2020-03-21 NOTE — Addendum Note (Signed)
Addended by: Kerney Elbe on: 03/21/2020 04:55 PM   Modules accepted: Orders

## 2020-03-21 NOTE — Progress Notes (Signed)
Cardiology Office Note  Date: 03/21/2020   ID: Justin Estrada, DOB 07-Mar-1965, MRN [id number]  PCP:  Babs Sciara, MD  Cardiologist:  Nona Dell, MD Electrophysiologist:  None   Chief Complaint  Patient presents with  . Cardiac follow-up    History of Present Illness: Justin Estrada is a 55 y.o. male former patient of Dr. Purvis Sheffield now presenting to establish follow-up with me.  I reviewed his records and updated the chart.  He was last assessed via telehealth encounter in January 2021.  He presents today reporting no recurring chest discomfort or other exertional symptoms.  He is a Arts development officer, Scientist, forensic.  He and his wife have started a diet plan and he also intends to increase walking for exercise.  He would like to lose weight, potentially as much as 50 pounds in a controlled manner.  I reviewed his medications which are outlined below.  He continues to follow regularly with Dr. Gerda Diss.  I reviewed his ECG today which shows sinus rhythm, Q-wave in lead III likely normal variant.   Past Medical History:  Diagnosis Date  . Anal fissure   . History of cardiac catheterization    Normal coronary arteries January 2021    Past Surgical History:  Procedure Laterality Date  . BIOPSY  10/12/2017   Procedure: BIOPSY;  Surgeon: Malissa Hippo, MD;  Location: [facility];  Service: Endoscopy;;  antral biopsy   . COLONOSCOPY     10 plus years ago by Dr.Rourk  . COLONOSCOPY N/A 07/28/2016   Procedure: COLONOSCOPY;  Surgeon: Malissa Hippo, MD;  Location: [facility];  Service: Endoscopy;  Laterality: N/A;  730-rescheduled to 6/4 same time per Dewayne Hatch  . ESOPHAGOGASTRODUODENOSCOPY N/A 10/12/2017   Procedure: ESOPHAGOGASTRODUODENOSCOPY (EGD);  Surgeon: Malissa Hippo, MD;  Location: [facility];  Service: Endoscopy;  Laterality: N/A;  730  . EXCISION OF SKIN TAG N/A 09/30/2017   Procedure: EXCISION OF PERIANAL SKIN TAGS;  Surgeon: Lucretia Roers, MD;  Location:  [facility];  Service: General;  Laterality: N/A;  . foot surgery  for bone spurs Bilateral   . LEFT HEART CATH AND CORONARY ANGIOGRAPHY N/A 03/11/2019   Procedure: LEFT HEART CATH AND CORONARY ANGIOGRAPHY;  Surgeon: Lyn Records, MD;  Location: [facility];  Service: Cardiovascular;  Laterality: N/A;  . UPPER GASTROINTESTINAL ENDOSCOPY     10 plus years ago by Dr.Rourk    Current Outpatient Medications  Medication Sig Dispense Refill  . diltiazem 2 % GEL Apply 1 application topically at bedtime as needed (anal fissure). Use as directed. 30 g 1  . docusate sodium (COLACE) 100 MG capsule Take 1 capsule (100 mg total) by mouth 2 (two) times daily. (Patient taking differently: Take 100 mg by mouth at bedtime.) 60 capsule 0  . psyllium (METAMUCIL SMOOTH TEXTURE) 28 % packet Take 1 packet by mouth at bedtime.     No current facility-administered medications for this visit.   Allergies:  Patient has no known allergies.   ROS:   No palpitations or syncope.  Physical Exam: VS:  BP 134/82   Pulse 72   Ht 5\' 11"  (1.803 m)   Wt 245 lb (111.1 kg)   SpO2 98%   BMI 34.17 kg/m , BMI Body mass index is 34.17 kg/m.  Wt Readings from Last 3 Encounters:  03/21/20 245 lb (111.1 kg)  02/13/20 247 lb 12.8 oz (112.4 kg)  01/30/20 246 lb (111.6 kg)  General: Patient appears comfortable at rest. HEENT: Conjunctiva and lids normal, wearing a mask. Neck: Supple, no elevated JVP or carotid bruits, no thyromegaly. Lungs: Clear to auscultation, nonlabored breathing at rest. Cardiac: Regular rate and rhythm, no S3 or significant systolic murmur, no pericardial rub. Extremities: No pitting edema.  ECG:  An ECG dated 03/11/2019 was personally reviewed today and demonstrated:  Normal sinus rhythm.  Recent Labwork: 05/02/2019: Hemoglobin 16.3; Platelets 327 07/11/2019: BUN 15; Creatinine, Ser 1.08; Potassium 4.7; Sodium 143 02/15/2020: TSH 1.380     Component Value Date/Time   CHOL 156 07/11/2019  0802   TRIG 110 07/11/2019 0802   HDL 36 (L) 07/11/2019 0802   CHOLHDL 4.3 07/11/2019 0802   LDLCALC 100 (H) 07/11/2019 0802    Other Studies Reviewed Today:  Cardiac catheterization 03/11/2019:  Left dominant coronary anatomy.  Angiographically normal coronary arteries.  Normal left ventricular size and function.  EF 60%.  Normal LVEDP.  Assessment and Plan:  Previous history of chest pain with reassuring cardiac work-up.  Cardiac catheterization in January 2021 revealed normal coronary arteries and normal LVEF.  No additional cardiac testing indicated at this time.  I reviewed his ECG.  Agree with plans for diet and exercise regimen.  Medication Adjustments/Labs and Tests Ordered: Current medicines are reviewed at length with the patient today.  Concerns regarding medicines are outlined above.   Tests Ordered: No orders of the defined types were placed in this encounter.   Medication Changes: No orders of the defined types were placed in this encounter.   Disposition:  Follow up 1 year in the [place] office.  Signed, Jonelle Sidle, MD, [facility] 03/21/2020 2:44 PM    [facility] [address] [place], [place] [address] Phone: [phone]; Fax: [phone]

## 2020-03-23 ENCOUNTER — Ambulatory Visit (INDEPENDENT_AMBULATORY_CARE_PROVIDER_SITE_OTHER): Payer: BC Managed Care – PPO | Admitting: Family Medicine

## 2020-03-23 ENCOUNTER — Other Ambulatory Visit: Payer: Self-pay

## 2020-03-23 ENCOUNTER — Encounter: Payer: Self-pay | Admitting: Family Medicine

## 2020-03-23 VITALS — BP 122/82 | Temp 97.0°F | Ht 71.0 in | Wt 245.0 lb

## 2020-03-23 DIAGNOSIS — R0789 Other chest pain: Secondary | ICD-10-CM | POA: Diagnosis not present

## 2020-03-23 DIAGNOSIS — G479 Sleep disorder, unspecified: Secondary | ICD-10-CM | POA: Diagnosis not present

## 2020-03-23 DIAGNOSIS — R519 Headache, unspecified: Secondary | ICD-10-CM

## 2020-03-23 NOTE — Progress Notes (Signed)
   Subjective:    Patient ID: Justin Estrada, male    DOB: 07-09-1965, 55 y.o.   MRN: [id number]  HPI  Patient arrives to follow up on recent sleep study thru neurology. Very nice patient At times he feels like he does not sleep well other times he does feel like he sleeps well patient finds himself feeling tired and fatigued during the day at times this affects his cognitive skills but does not fall asleep with work or driving  He also relates he gets a frequent unusual sensation in the frontal part of his head near where the frontal sinuses are it feels like a very intense pressure or tightness it will last sometimes for a few minutes sometimes for 30 minutes then it seems to go away.  He does not really related to any type of postnasal drainage coughing or sneezing.  He feels like he can breathe through his nose fairly well.  He has seen Dr. Jenne Pane in the past.  He is also seeing Dr. Everlena Cooper as well  In addition to this patient has a intermittent sharp chest pain on the left pectoral area that has been bothering him off and on for couple years now he has had a cardiac work-up which has been negative for any coronary artery disease.  He will be seeing a musculoskeletal specialist coming up for further evaluation  Patient also relates he has had a couple times where he had unusual lites in the left upper quadrant of his visual field but he is uncertain if it is just in one eye or both eyes he will keep track of his symptoms and notify us in the future what this is showing Review of Systems Please see above    Objective:   Physical Exam Lungs clear respiratory rate normal heart regular no murmurs extremities no edema neurologic grossly normal no focal signs are noted       Assessment & Plan:  1. Frontal headache It is hard to know if this is due to sinus or possibly migraine equivalent.  Patient will keep track of his symptoms.  I will connect with Dr. Everlena Cooper to see if he feels that this type of  symptoms could be migraine or more likely sinus.  If it is felt more likely sinus we can set him up with Dr. Jenne Pane  2. Sleep disturbance May use melatonin his sleep study was negative supportive measures discussed  3. Musculoskeletal chest pain To see sports medicine specialist to see if there could be underlying musculoskeletal origin to this.  He has had a very thorough work-up of gastroenterology cardiology plus also chest x-ray in the past was negative. Should also be noted that this area seem to cause him trouble after lifting on a lawnmower if I remember correctly

## 2020-03-26 ENCOUNTER — Encounter: Payer: Self-pay | Admitting: Family Medicine

## 2020-03-28 NOTE — Progress Notes (Unsigned)
[name] [address] [place] [address] Phone: [phone] Subjective:   Bruce Donath, am serving as a scribe for Dr. [name]. This visit occurred during the SARS-CoV-2 public health emergency.  Safety protocols were in place, including screening questions prior to the visit, additional usage of staff PPE, and extensive cleaning of exam room while observing appropriate contact time as indicated for disinfecting solutions.   I'm seeing this patient by the request  of:  Babs Sciara, MD  CC: Left-sided chest wall pain  DGL:[id number]  ALEXX MCBURNEY is a 55 y.o. male coming in with complaint of left chest pain since March 2020. Pain developed after lifting a lawn mower. Pain is sharp when he reaches down for something on floor. Achiness at rest. Did try NSAIDs which helped pain initially. Does mention hand being numb at night on left side. History of "muscular tear" in left upper arm after tripping over ottoman in 2018.  Patient has been trying to find associations.  Has not noticed any association with food or certain activities.  This seems when he reaches down for his arm continue to have the pain as well as at night when he lays on the side.  Patient had seen a orthopedic provider and was worked up for more of the shoulder pain.  Did have an MRI in 2019 showing some tendinopathy and interstitial tearing of the intra-articular long head of the bicep.  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   Patient still states that does not have significant amount of pain in the shoulder.   Patient has been worked up for cardiac issues.  Patient did have a chemical stress test done in 2020 that did not have a potential consistent finding of a moderate size inferior myocardial infarction, patient went under a left-sided heart cath done on March 11, 2019 though that showed  that patient had normal anatomy with normal coronary arteries.  Recently saw cardiology on March 21, 2020 and they did not feel that this was cardiac in nature.  Sent here for further evaluation.  Patient did have neck x-rays done February 2021.  These were independently visualized by me showing no significant bony abnormality either.  Past Medical History:  Diagnosis Date  . Anal fissure   . History of cardiac catheterization    Normal coronary arteries January 2021   Past Surgical History:  Procedure Laterality Date  . BIOPSY  10/12/2017   Procedure: BIOPSY;  Surgeon: Malissa Hippo, MD;  [facility];  Service: Endoscopy;;  antral biopsy   . COLONOSCOPY     10 plus years ago by Dr.Rourk  . COLONOSCOPY N/A 07/28/2016   Procedure: COLONOSCOPY;  Surgeon: Malissa Hippo, MD;  [facility];  Service: Endoscopy;  Laterality: N/A;  730-rescheduled to 6/4 same time per Dewayne Hatch  . ESOPHAGOGASTRODUODENOSCOPY N/A 10/12/2017   Procedure: ESOPHAGOGASTRODUODENOSCOPY (EGD);  Surgeon: Malissa Hippo, MD;  [facility];  Service: Endoscopy;  Laterality: N/A;  730  . EXCISION OF SKIN TAG N/A 09/30/2017   Procedure: EXCISION OF PERIANAL SKIN TAGS;  Surgeon: Lucretia Roers, MD;  Location: [facility];  Service: General;  Laterality: N/A;  . foot surgery  for bone spurs Bilateral   . LEFT HEART CATH AND CORONARY ANGIOGRAPHY N/A 03/11/2019   Procedure: LEFT HEART CATH AND CORONARY ANGIOGRAPHY;  Surgeon: Lyn Records, MD;  [facility];  Service: Cardiovascular;  Laterality: N/A;  . UPPER GASTROINTESTINAL ENDOSCOPY     10 plus years ago by Dr.Rourk   Social History   Socioeconomic History  . Marital status: Married    Spouse name: Jama  . Number of children: 2  . Years of education: 16  . Highest education level: Bachelor's degree (e.g., BA, AB, BS)  Occupational History  . Occupation: Magazine features editor: [employer]  Tobacco Use  .  Smoking status: Never Smoker  . Smokeless tobacco: Never Used  Vaping Use  . Vaping Use: Never used  Substance and Sexual Activity  . Alcohol use: No    Alcohol/week: 0.0 standard drinks  . Drug use: No  . Sexual activity: Yes    Birth control/protection: None  Other Topics Concern  . Not on file  Social History Narrative   Patient is right-handed. He lives with his wife and 2 children in a 2 level home with a  Basement. He drinks two cups of coffee and one 16 oz bottle of soda daily. He walks occasionally.   Social Determinants of Health   Financial Resource Strain: Not on file  Food Insecurity: Not on file  Transportation Needs: Not on file  Physical Activity: Not on file  Stress: Not on file  Social Connections: Not on file   No Known Allergies Family History  Problem Relation Age of Onset  . Hypertension Mother   . Lung cancer Mother   . Liver cancer Mother   . Hypertension Father   . Congestive Heart Failure Father   . Atrial fibrillation Brother      Current Outpatient Medications (Cardiovascular):  .  diltiazem 2 % GEL*, Apply 1 application topically at bedtime as needed (anal fissure). Use as directed.     Current Outpatient Medications (Other):  .  diltiazem 2 % GEL*, Apply 1 application topically at bedtime as needed (anal fissure). Use as directed. .  docusate sodium (COLACE) 100 MG capsule, Take 1 capsule (100 mg total) by mouth 2 (two) times daily. (Patient taking differently: Take 100 mg by mouth at bedtime.) .  gabapentin (NEURONTIN) 100 MG capsule, Take 2 capsules (200 mg total) by mouth at bedtime. .  psyllium (METAMUCIL SMOOTH TEXTURE) 28 % packet, Take 1 packet by mouth at bedtime. * These medications belong to multiple therapeutic classes and are listed under each applicable group.   Reviewed prior external information including notes and imaging from  primary care provider As well as notes that were available from care everywhere and other  healthcare systems.  Past medical history, social, surgical and family history all reviewed in electronic medical record.  No pertanent information unless stated regarding to the chief complaint.   Review of Systems:  No headache, visual changes, nausea, vomiting, diarrhea, constipation, dizziness, abdominal pain, skin rash, fevers, chills, night sweats, weight loss, swollen lymph nodes, body aches, joint swelling,  shortness of breath, mood changes. POSITIVE muscle aches  Objective  Blood pressure 110/78, pulse 60, height 5\' 11"  (1.803 m), weight 238 lb (108 kg), SpO2 97 %.   General: No apparent distress alert and oriented x3 mood and affect normal, dressed appropriately.  HEENT: Pupils equal, extraocular movements intact  Respiratory: Patient's speak in full sentences and does not appear short of breath  Cardiovascular: No lower extremity edema, non tender, no erythema  Gait normal with good balance and coordination.  MSK: Left shoulder exam shows the patient does have  near full range of motion.  No sign of impingement.  5 out of 5 strength of the rotator cuff.  Negative crossover as well as negative O'Brien's test.  Patient does have 5 out of 5 strength of the upper extremity on the side.  Patient does have a mild positive Tinel's sign though on the left compared to the right.  Good grip strength.  Deep tendon reflexes of the upper extremities are intact.  Patient's neck exam does have some very mild loss of lordosis.  Negative Spurling's.  On exam chest exam does not have any significant abnormality.  Patient does have some mild tenderness noted between the fourth and fifth ribs anteriorly.  No masses appreciated noted.  Patient can take a large breath without any increasing discomfort.  No pain over the sternal border itself.    Impression and Recommendations:     The above documentation has been reviewed and is accurate and complete Judi Saa, DO

## 2020-03-29 ENCOUNTER — Ambulatory Visit (INDEPENDENT_AMBULATORY_CARE_PROVIDER_SITE_OTHER): Payer: BC Managed Care – PPO

## 2020-03-29 ENCOUNTER — Ambulatory Visit (INDEPENDENT_AMBULATORY_CARE_PROVIDER_SITE_OTHER): Payer: BC Managed Care – PPO | Admitting: Family Medicine

## 2020-03-29 ENCOUNTER — Other Ambulatory Visit: Payer: Self-pay

## 2020-03-29 ENCOUNTER — Encounter: Payer: Self-pay | Admitting: Family Medicine

## 2020-03-29 VITALS — BP 110/78 | HR 60 | Ht 71.0 in | Wt 238.0 lb

## 2020-03-29 DIAGNOSIS — R0789 Other chest pain: Secondary | ICD-10-CM | POA: Diagnosis not present

## 2020-03-29 DIAGNOSIS — M25512 Pain in left shoulder: Secondary | ICD-10-CM

## 2020-03-29 DIAGNOSIS — R0781 Pleurodynia: Secondary | ICD-10-CM

## 2020-03-29 DIAGNOSIS — G8929 Other chronic pain: Secondary | ICD-10-CM | POA: Diagnosis not present

## 2020-03-29 DIAGNOSIS — M79602 Pain in left arm: Secondary | ICD-10-CM | POA: Diagnosis not present

## 2020-03-29 MED ORDER — GABAPENTIN 100 MG PO CAPS: 200.0000 mg | ORAL_CAPSULE | Freq: Every day | ORAL | 0 refills | Status: DC

## 2020-03-29 NOTE — Assessment & Plan Note (Addendum)
Patient has had difficulty with this for quite some time.  Patient states that this could have started in 2020.  Reviewing patient's chart in 2019 was also seen for more of the left shoulder pain the patient did have some tendinopathy noted the patient on exam today has full range of motion of the shoulder with no significant pain and 5 out of 5 strength of the rotator cuff.  Patient is moderately tender to palpation on the chest wall but no true masses appreciated.  Patient does have signs and symptoms consistent more with carpal tunnel distally and then wondering if there is any brachial plexus aspect or the severity of carpal tunnel that could be causing some referred pain especially with it being so intermittent.  At this point I would like to get a nerve conduction study to further evaluate.  Bracing at night for the carpal tunnel, gabapentin given for short course.  Differential includes a potential cervical radiculopathy or thoracic outlet syndrome.  Patient has had this pain for at least 2 years at this time and if continuing to have discomfort I would consider potentially even an MRI of the chest and shoulder to further evaluate.  My hope is with the conservative therapy patient will make improvement.  Patient is in agreement with the plan.  Follow-up with me again in 6 to 8 weeks.  Patient has been seen by cardiology and catheterization was unremarkable per patient patient is also had what appears to be an EGD in 2019

## 2020-03-29 NOTE — Patient Instructions (Addendum)
Gabapentin 200mg  at night EMG-they will call to schedule  Xray L shoulder and L rib today See me again in 4 weeks

## 2020-03-30 ENCOUNTER — Other Ambulatory Visit: Payer: Self-pay

## 2020-03-30 ENCOUNTER — Encounter: Payer: Self-pay | Admitting: Neurology

## 2020-03-30 DIAGNOSIS — R202 Paresthesia of skin: Secondary | ICD-10-CM

## 2020-04-11 ENCOUNTER — Encounter: Payer: Self-pay | Admitting: Family Medicine

## 2020-04-12 ENCOUNTER — Other Ambulatory Visit: Payer: Self-pay

## 2020-04-12 ENCOUNTER — Ambulatory Visit: Payer: BC Managed Care – PPO | Admitting: Podiatry

## 2020-04-12 ENCOUNTER — Encounter: Payer: Self-pay | Admitting: Family Medicine

## 2020-04-12 DIAGNOSIS — I7300 Raynaud's syndrome without gangrene: Secondary | ICD-10-CM | POA: Diagnosis not present

## 2020-04-12 DIAGNOSIS — M7671 Peroneal tendinitis, right leg: Secondary | ICD-10-CM | POA: Diagnosis not present

## 2020-04-16 NOTE — Progress Notes (Signed)
Subjective:   Patient ID: Justin Estrada, male   DOB: 55 y.o.   MRN: [id number]   HPI Patient presents concerned about some discoloration of the digits with redness and also has had some inflammation outside of the right foot   ROS      Objective:  Physical Exam  Neurovascular status is found to be intact with good digital perfusion but there was some chilliness to the distal aspects of the toes with some discoloration of the lesser digits but no active drainage noted.  Mild discomfort peroneal tendon improved     Assessment:  Probability for vascular incident with Raynaud's phenomena that occurred bilateral with tendinitis     Plan:  H&P education rendered discussed the importance of protecting the feet and wearing warm socks and thick shoes and if any issues were to occur may eventually ulcerate we will have to treat but at this point premature and also went ahead and discussed continued ice for peroneal tendinitis as needed

## 2020-04-30 ENCOUNTER — Other Ambulatory Visit: Payer: Self-pay

## 2020-04-30 ENCOUNTER — Ambulatory Visit: Payer: BC Managed Care – PPO | Admitting: Family Medicine

## 2020-04-30 ENCOUNTER — Encounter: Payer: Self-pay | Admitting: Family Medicine

## 2020-04-30 DIAGNOSIS — R0789 Other chest pain: Secondary | ICD-10-CM | POA: Diagnosis not present

## 2020-04-30 DIAGNOSIS — G5602 Carpal tunnel syndrome, left upper limb: Secondary | ICD-10-CM

## 2020-04-30 NOTE — Patient Instructions (Signed)
Tart cherry extract 1200mg  at night Still do EMG Gabapentin can go down to 100mg , if no diff then discontinue  If worsening then restart 200mg  of gabapentin See me again in 5-6 weeks

## 2020-04-30 NOTE — Assessment & Plan Note (Signed)
Patient has been making improvement at this time.  Discussed with patient about the gabapentin.  Still could be potentially from the carpal tunnel and is still awaiting the nerve conduction study which is scheduled at the end of this month.  Patient will start to titrate off the gabapentin and see how patient responds.  Follow-up with me again 6 weeks

## 2020-04-30 NOTE — Progress Notes (Signed)
[name] [address] [place] [address] Phone: [phone] Subjective:   Justin Estrada, am serving as a scribe for Dr. [name]. This visit occurred during the SARS-CoV-2 public health emergency.  Safety protocols were in place, including screening questions prior to the visit, additional usage of staff PPE, and extensive cleaning of exam room while observing appropriate contact time as indicated for disinfecting solutions.   I'm seeing this patient by the request  of:  Babs Sciara, MD  CC: Left chest wall tightness follow-up  HEN:[id number]   03/29/2020 Patient has had difficulty with this for quite some time.  Patient states that this could have started in 2020.  Reviewing patient's chart in 2019 was also seen for more of the left shoulder pain the patient did have some tendinopathy noted the patient on exam today has full range of motion of the shoulder with no significant pain and 5 out of 5 strength of the rotator cuff.  Patient is moderately tender to palpation on the chest wall but no true masses appreciated.  Patient does have signs and symptoms consistent more with carpal tunnel distally and then wondering if there is any brachial plexus aspect or the severity of carpal tunnel that could be causing some referred pain especially with it being so intermittent.  At this point I would like to get a nerve conduction study to further evaluate.  Bracing at night for the carpal tunnel, gabapentin given for short course.  Differential includes a potential cervical radiculopathy or thoracic outlet syndrome.  Patient has had this pain for at least 2 years at this time and if continuing to have discomfort I would consider potentially even an MRI of the chest and shoulder to further evaluate.  My hope is with the conservative therapy patient will make improvement.  Patient is in agreement with the plan.  Follow-up with me again in 6 to 8 weeks.  Patient  has been seen by cardiology and catheterization was unremarkable per patient patient is also had what appears to be an EGD in 2019   Update 04/30/2020 Justin Estrada is a 55 y.o. male coming in with complaint of left arm and chest pain. Patient states that he is improving. Dull pain in the left lat. No longer having chest/pec pain.  Patient states a double sensation but no any true chest discomfort at this time.  Nothing that is stopping him from activity has full range of motion  Using brace at night but does not notice a difference. Does take 200mg  of gabapentin at night.  Patient states that overall is feeling about 85% better.    Past Medical History:  Diagnosis Date  . Anal fissure   . History of cardiac catheterization    Normal coronary arteries January 2021   Past Surgical History:  Procedure Laterality Date  . BIOPSY  10/12/2017   Procedure: BIOPSY;  Surgeon: 10/14/2017, MD;  [facility];  Service: Endoscopy;;  antral biopsy   . COLONOSCOPY     10 plus years ago by Dr.Rourk  . COLONOSCOPY N/A 07/28/2016   Procedure: COLONOSCOPY;  Surgeon: 09/27/2016, MD;  [facility];  Service: Endoscopy;  Laterality: N/A;  730-rescheduled to 6/4 same time per 8/4  . ESOPHAGOGASTRODUODENOSCOPY N/A 10/12/2017   Procedure: ESOPHAGOGASTRODUODENOSCOPY (EGD);  Surgeon: 10/14/2017, MD;  [facility];  Service: Endoscopy;  Laterality: N/A;  730  . EXCISION OF SKIN  TAG N/A 09/30/2017   Procedure: EXCISION OF PERIANAL SKIN TAGS;  Surgeon: Lucretia Roers, MD;  Location: [facility];  Service: General;  Laterality: N/A;  . foot surgery  for bone spurs Bilateral   . LEFT HEART CATH AND CORONARY ANGIOGRAPHY N/A 03/11/2019   Procedure: LEFT HEART CATH AND CORONARY ANGIOGRAPHY;  Surgeon: Lyn Records, MD;  [facility];  Service: Cardiovascular;  Laterality: N/A;  . UPPER GASTROINTESTINAL ENDOSCOPY     10 plus years ago by Dr.Rourk   Social  History   Socioeconomic History  . Marital status: Married    Spouse name: Jama  . Number of children: 2  . Years of education: 16  . Highest education level: Bachelor's degree (e.g., BA, AB, BS)  Occupational History  . Occupation: Magazine features editor: [employer]  Tobacco Use  . Smoking status: Never Smoker  . Smokeless tobacco: Never Used  Vaping Use  . Vaping Use: Never used  Substance and Sexual Activity  . Alcohol use: No    Alcohol/week: 0.0 standard drinks  . Drug use: No  . Sexual activity: Yes    Birth control/protection: None  Other Topics Concern  . Not on file  Social History Narrative   Patient is right-handed. He lives with his wife and 2 children in a 2 level home with a  Basement. He drinks two cups of coffee and one 16 oz bottle of soda daily. He walks occasionally.   Social Determinants of Health   Financial Resource Strain: Not on file  Food Insecurity: Not on file  Transportation Needs: Not on file  Physical Activity: Not on file  Stress: Not on file  Social Connections: Not on file   No Known Allergies Family History  Problem Relation Age of Onset  . Hypertension Mother   . Lung cancer Mother   . Liver cancer Mother   . Hypertension Father   . Congestive Heart Failure Father   . Atrial fibrillation Brother      Current Outpatient Medications (Cardiovascular):  .  diltiazem 2 % GEL*, Apply 1 application topically at bedtime as needed (anal fissure). Use as directed.     Current Outpatient Medications (Other):  .  diltiazem 2 % GEL*, Apply 1 application topically at bedtime as needed (anal fissure). Use as directed. .  docusate sodium (COLACE) 100 MG capsule, Take 1 capsule (100 mg total) by mouth 2 (two) times daily. (Patient taking differently: Take 100 mg by mouth at bedtime.) .  gabapentin (NEURONTIN) 100 MG capsule, Take 2 capsules (200 mg total) by mouth at bedtime. .  psyllium (METAMUCIL SMOOTH TEXTURE) 28 % packet, Take 1  packet by mouth at bedtime. * These medications belong to multiple therapeutic classes and are listed under each applicable group.   Reviewed prior external information including notes and imaging from  primary care provider As well as notes that were available from care everywhere and other healthcare systems.  Past medical history, social, surgical and family history all reviewed in electronic medical record.  No pertanent information unless stated regarding to the chief complaint.   Review of Systems:  No headache, visual changes, nausea, vomiting, diarrhea, constipation, dizziness, abdominal pain, skin rash, fevers, chills, night sweats, weight loss, swollen lymph nodes, body aches, joint swelling, chest pain, shortness of breath, mood changes.   Objective  Blood pressure 122/68, pulse 75, height 5\' 11"  (1.803 m), weight 233 lb (105.7 kg), SpO2 96 %.   General:  No apparent distress alert and oriented x3 mood and affect normal, dressed appropriately.  HEENT: Pupils equal, extraocular movements intact  Respiratory: Patient's speak in full sentences and does not appear short of breath  Cardiovascular: No lower extremity edema, non tender, no erythema  Gait normal with good balance and coordination.  MSK:  Non tender with full range of motion and good stability and symmetric strength and tone of elbows, wrist, hip, knee and ankles bilaterally.  Left shoulder exam has full range of motion noted.  Wrist exam is actually improved as well.  Patient does have good range of motion of the shoulder with 5 out of 5 strength.  Nontender on the chest wall and no true masses appreciated.   Impression and Recommendations:     The above documentation has been reviewed and is accurate and complete Judi Saa, DO

## 2020-04-30 NOTE — Assessment & Plan Note (Signed)
Patient does not know if this is really contributing to the pain.  Patient noted is feeling better.  Wants to know if he can get off the gabapentin.  No side effects but does not want to take it regularly.  Patient is feeling much better overall and had been doing the bracing at night.  Awaiting nerve conduction study.  Follow-up again in 4 to 6 weeks

## 2020-05-02 ENCOUNTER — Other Ambulatory Visit: Payer: Self-pay

## 2020-05-02 ENCOUNTER — Ambulatory Visit: Payer: BC Managed Care – PPO | Admitting: Family Medicine

## 2020-05-02 ENCOUNTER — Encounter: Payer: Self-pay | Admitting: Family Medicine

## 2020-05-02 VITALS — BP 128/82 | Temp 98.0°F | Ht 71.0 in | Wt 235.0 lb

## 2020-05-02 DIAGNOSIS — F410 Panic disorder [episodic paroxysmal anxiety] without agoraphobia: Secondary | ICD-10-CM

## 2020-05-02 DIAGNOSIS — F419 Anxiety disorder, unspecified: Secondary | ICD-10-CM | POA: Diagnosis not present

## 2020-05-02 DIAGNOSIS — R519 Headache, unspecified: Secondary | ICD-10-CM

## 2020-05-02 MED ORDER — FLUOXETINE HCL 10 MG PO CAPS: 10.0000 mg | ORAL_CAPSULE | Freq: Every day | ORAL | 3 refills | Status: DC

## 2020-05-02 NOTE — Progress Notes (Signed)
   Subjective:    Patient ID: Justin Estrada, male    DOB: 02/08/1966, 55 y.o.   MRN: [id number]  HPI  Very nice patient Coming in with some complex issues He is having spells where he feels a tightness on his forehead along with some slight discomfort.  He has had a normal MRI.  Has seen neurology.  They told him that it is possible it could be migraines.  In addition to this he has spells where certain events will trigger a anxious feeling almost to the point of having a panic attack.  More so when he is driving.  Especially if he is driving over a bridge or in a wide open area. For a while also had some times where he felt panicky when in large crowds of people but that is getting better Stress levels overall are good Sleep pattern is not good mainly because at home have been asleep some on the couch because they are in the process of getting a new bed Has had sleep studies which were negative for sleep apnea Denies being depressed   Patient arrives to follow up on headaches and discuss anxiety with driving.   Review of Systems     Objective:   Physical Exam Today's exam was deferred, visit was fully discussion and counseling       Assessment & Plan:  1. Panic attack I believe patient would benefit from cognitive behavioral therapy to try to help equip him with mental health approaches to help work through these feelings when he has some happen with certain events such as driving - Ambulatory referral to Psychology  2. Anxiousness 4 potentially the next 4 to 6 months I would recommend fluoxetine 10 mg 1 daily it could take 3 to 4 weeks to see improvement but by helping serotonin levels it should help his level of trouble that he is having we did discuss side effects if he feels like the medication is not being well-tolerated he is to let us know he is to give [place] an update in a few weeks time  3. Nonintractable episodic headache, unspecified headache type Normal MRI no need for  any type of Imitrex or Topamax currently monitor accordingly  Follow-up in 8 to 12 weeks

## 2020-05-10 ENCOUNTER — Encounter: Payer: Self-pay | Admitting: Family Medicine

## 2020-05-16 ENCOUNTER — Ambulatory Visit: Payer: BC Managed Care – PPO | Admitting: Neurology

## 2020-05-16 ENCOUNTER — Other Ambulatory Visit: Payer: Self-pay

## 2020-05-16 DIAGNOSIS — R202 Paresthesia of skin: Secondary | ICD-10-CM

## 2020-05-16 DIAGNOSIS — G5622 Lesion of ulnar nerve, left upper limb: Secondary | ICD-10-CM

## 2020-05-16 NOTE — Procedures (Signed)
[facility]  [address] [place], [address]  [place], [place] [address] Tel: [phone] Fax:  [phone] Test Date:  05/16/2020  Patient: Justin Estrada DOB: 12-Dec-1965 Physician: Nita Sickle, DO  Sex: Male Height: 5\' 11"  Ref Phys: , DO  ID#: [name]   Technician:    Patient Complaints: This is a 55 year old man referred for evaluation of left arm paresthesias.  NCV & EMG Findings: Extensive electrodiagnostic testing of the left upper extremity shows:  1. Left median and radial sensory responses are within normal limits.  Left ulnar sensory response shows prolonged latency (3.7 ms). 2. Left median motor responses within normal limits.  Left ulnar motor response shows slowed conduction velocity across the elbow (A Elbow-B Elbow, 38 m/s).   3. There is no evidence of active or chronic motor axonal loss changes affecting any of the tested muscles.  Motor unit configuration and recruitment pattern is within normal limits.     Impression: 1. Left ulnar neuropathy with slowing across the elbow, purely demyelinating, moderate. 2. There is no evidence of carpal tunnel syndrome or cervical radiculopathy affecting the left upper extremity   ___________________________ 40, DO    Nerve Conduction Studies Anti Sensory Summary Table   Stim Site NR Peak (ms) Norm Peak (ms) P-T Amp (V) Norm P-T Amp  Left Median Anti Sensory (2nd Digit)  33C  Wrist    3.3 <3.6 15.7 >15  Left Radial Anti Sensory (Base 1st Digit)  33C  Wrist    1.9 <2.7 14.6 >14  Left Ulnar Anti Sensory (5th Digit)  33C  Wrist    3.7 <3.1 17.1 >10   Motor Summary Table   Stim Site NR Onset (ms) Norm Onset (ms) O-P Amp (mV) Norm O-P Amp Site1 Site2 Delta-0 (ms) Dist (cm) Vel (m/s) Norm Vel (m/s)  Left Median Motor (Abd Poll Brev)  33C  Wrist    3.6 <4.0 8.6 >6 Elbow Wrist 4.8 29.0 60 >50  Elbow    8.4  8.1         Left Ulnar Motor (Abd Dig Minimi)  33C  Wrist    2.2 <3.1 8.8 >7 B  Elbow Wrist 3.4 22.0 65 >50  B Elbow    5.6  7.8  A Elbow B Elbow 2.6 10.0 38 >50  A Elbow    8.2  7.2          EMG   Side Muscle Ins Act Fibs Psw Fasc Number Recrt Dur Dur. Amp Amp. Poly Poly. Comment  Left 1stDorInt Nml Nml Nml Nml Nml Nml Nml Nml Nml Nml Nml Nml N/A  Left Abd Poll Brev Nml Nml Nml Nml Nml Nml Nml Nml Nml Nml Nml Nml N/A  Left ABD Dig Min Nml Nml Nml Nml Nml Nml Nml Nml Nml Nml Nml Nml N/A  Left PronatorTeres Nml Nml Nml Nml Nml Nml Nml Nml Nml Nml Nml Nml N/A  Left Biceps Nml Nml Nml Nml Nml Nml Nml Nml Nml Nml Nml Nml N/A  Left Triceps Nml Nml Nml Nml Nml Nml Nml Nml Nml Nml Nml Nml N/A  Left Deltoid Nml Nml Nml Nml Nml Nml Nml Nml Nml Nml Nml Nml N/A  Left Infraspinatus Nml Nml Nml Nml Nml Nml Nml Nml Nml Nml Nml Nml N/A      Waveforms:

## 2020-05-28 ENCOUNTER — Telehealth: Payer: Self-pay | Admitting: Family Medicine

## 2020-05-28 ENCOUNTER — Encounter: Payer: Self-pay | Admitting: Family Medicine

## 2020-05-28 DIAGNOSIS — R519 Headache, unspecified: Secondary | ICD-10-CM

## 2020-05-28 NOTE — Telephone Encounter (Signed)
Called and discussed with pt. Pt states he has a follow up with Dr. Katrinka Blazing next week on the 14th. States he did speak with him and was told to follow up to discuss some different options.

## 2020-05-28 NOTE — Telephone Encounter (Signed)
Nurses Please connect with Justin Estrada He was sent to neurology for nerve conduction study We received the results. What I cannot tell from the documentation is the following: Did neurology let the patient know the results of his tests? If so did they recommend any type of evaluation or treatment from this point?  His test shows some ulnar nerve compression in the elbow.  This can give him some numbness into the hand.  No sign of impingement in the neck.  Orthopedic consultation would typically be the next step.

## 2020-05-29 ENCOUNTER — Encounter: Payer: Self-pay | Admitting: Family Medicine

## 2020-05-29 NOTE — Telephone Encounter (Signed)
Nurses Please go ahead with referral to Dr. Jeris Penta ENT And forward the following message to [facility] I do believe it is wise to go ahead and be seen by ENT for further evaluation. They can help judge if they feel that this issue is ENT related then more than likely they will initiate treatment If they do not feel it is ENT related then at least we have appropriately addressed it from a ENT perspective and can go in a different direction. We will go ahead with the referral.  Recently many of the offices are multiple weeks behind because of high volume but we will send them the referral in hopes that they will reach out to you soon to set you up an appointment.  If you feel this is getting worse to the point where you would like to be seen by myself let me know I will do what I can to work you into the schedule in both    thank you-Coda Filler

## 2020-05-29 NOTE — Telephone Encounter (Signed)
Nurses Patient could be worked in on Thursday at 330 This would be the only day in the next 2 weeks that I would be able to work him into the schedule thank you Next week would not be possible for myself because I have not scheduled in the office at all for next week He could have a visit with Clydie Braun next week or we could try to work him in the following week

## 2020-05-29 NOTE — Addendum Note (Signed)
Addended by: Marlowe Shores on: 05/29/2020 01:18 PM   Modules accepted: Orders

## 2020-05-31 ENCOUNTER — Ambulatory Visit: Payer: BC Managed Care – PPO | Admitting: Family Medicine

## 2020-05-31 ENCOUNTER — Encounter: Payer: Self-pay | Admitting: Family Medicine

## 2020-05-31 ENCOUNTER — Other Ambulatory Visit: Payer: Self-pay

## 2020-05-31 VITALS — BP 112/74 | Temp 97.2°F | Wt 234.8 lb

## 2020-05-31 DIAGNOSIS — R519 Headache, unspecified: Secondary | ICD-10-CM | POA: Diagnosis not present

## 2020-05-31 MED ORDER — TOPIRAMATE 50 MG PO TABS: 50.0000 mg | ORAL_TABLET | Freq: Two times a day (BID) | ORAL | 3 refills | Status: DC

## 2020-05-31 NOTE — Progress Notes (Signed)
   Subjective:    Patient ID: Justin Estrada, male    DOB: 09-13-1965, 55 y.o.   MRN: [id number]  HPI Pt still having headaches. Pain has been about a 3. Constant soreness above eyes. Does not have headache every day.  Dull ache at temples on some days.  Frequent sinus like pressure pain and discomfort in the frontal sinus region.  No head congestion drainage or coughing Referral has been sent to ENT the patient is awaiting an appointment Patient states at times it is in his temples Happen several times a day pain level is 3 out of 10 It is enough to cause him a decreased quality of life He would like to try medication He has had an MRI in the past which was negative for any brain lesion  Review of Systems  Constitutional: Negative for activity change.  HENT: Negative for congestion and rhinorrhea.   Respiratory: Negative for cough and shortness of breath.   Cardiovascular: Negative for chest pain.  Gastrointestinal: Negative for abdominal pain, diarrhea, nausea and vomiting.  Genitourinary: Negative for dysuria and hematuria.  Neurological: Negative for weakness and headaches.  Psychiatric/Behavioral: Negative for behavioral problems and confusion.       Objective:   Physical Exam Lungs clear heart regular pulse normal BP good HEENT is benign neurologic grossly normal   Side effects of topiramate was discussed if he feels it is making him feel bad dizzy nauseous stop medicine call us    Assessment & Plan:  Frequent frontal style headache with some temporal tendencies I believe it is reasonable to go ahead with topiramate 50 mg each evening for the first week then after that twice daily give [place] feedback within 3 weeks how things go and follow-up within 6 to 8 weeks how things: If not improving with the medicine consider referral to neurology  Will be seeing ENT to evaluate the possibility of underlying sinus trouble causing this the likelihood of this is low.

## 2020-06-04 ENCOUNTER — Ambulatory Visit (INDEPENDENT_AMBULATORY_CARE_PROVIDER_SITE_OTHER): Payer: BC Managed Care – PPO | Admitting: Clinical

## 2020-06-04 ENCOUNTER — Other Ambulatory Visit: Payer: Self-pay

## 2020-06-04 DIAGNOSIS — F411 Generalized anxiety disorder: Secondary | ICD-10-CM

## 2020-06-04 NOTE — Progress Notes (Signed)
Virtual Visit via Video Note  I connected with Justin Estrada on 06/04/20 at  8:00 AM EDT by a video enabled telemedicine application and verified that I am speaking with the correct person using two identifiers.  Location: Patient: Home Provider: Office   I discussed the limitations of evaluation and management by telemedicine and the availability of in person appointments. The patient expressed understanding and agreed to proceed.    Comprehensive Clinical Assessment (CCA) Note  06/04/2020 Justin Estrada [id number]  Chief Complaint: GAD Visit Diagnosis: GAD   CCA Screening, Triage and Referral (STR)  Patient Reported Information How did you hear about us? No data recorded Referral name: No data recorded Referral phone number: No data recorded  Whom do you see for routine medical problems? No data recorded Practice/Facility Name: No data recorded Practice/Facility Phone Number: No data recorded Name of Contact: No data recorded Contact Number: No data recorded Contact Fax Number: No data recorded Prescriber Name: No data recorded Prescriber Address (if known): No data recorded  What Is the Reason for Your Visit/Call Today? No data recorded How Long Has This Been Causing You Problems? No data recorded What Do You Feel Would Help You the Most Today? No data recorded  Have You Recently Been in Any Inpatient Treatment (Hospital/Detox/Crisis Center/28-Day Program)? No data recorded Name/Location of Program/Hospital:No data recorded How Long Were You There? No data recorded When Were You Discharged? No data recorded  [facility] You [facility]? No data recorded Who Do You [facility]? No data recorded  Have You Recently Had Any Thoughts About Hurting Yourself? No data recorded Are You Planning to Commit Suicide/Harm Yourself At This time? No data recorded  Have you Recently Had Thoughts About Hurting Someone Karolee Ohslse? No data  recorded Explanation: No data recorded  Have You Used Any Alcohol or Drugs in the Past 24 Hours? No data recorded How Long Ago Did You Use Drugs or Alcohol? No data recorded What Did You Use and How Much? No data recorded  Do You Currently Have a Therapist/Psychiatrist? No data recorded Name of Therapist/Psychiatrist: No data recorded  Have You Been Recently Discharged From Any Office Practice or Programs? No data recorded Explanation of Discharge From Practice/Program: No data recorded    CCA Screening Triage Referral Assessment Type of Contact: No data recorded Is this Initial or Reassessment? No data recorded Date Telepsych consult ordered in [facility]:  No data recorded Time Telepsych consult ordered in [facility]:  No data recorded  Patient Reported Information Reviewed? No data recorded Patient Left Without Being Seen? No data recorded Reason for Not Completing Assessment: No data recorded  Collateral Involvement: No data recorded  Does Patient Have a Court Appointed Legal Guardian? No data recorded Name and Contact of Legal Guardian: No data recorded If Minor and Not Living with Parent(s), Who has Custody? No data recorded Is CPS involved or ever been involved? No data recorded Is APS involved or ever been involved? No data recorded  Patient Determined To Be At Risk for Harm To Self or Others Based on Review of Patient Reported Information or Presenting Complaint? No data recorded Method: No data recorded Availability of Means: No data recorded Intent: No data recorded Notification Required: No data recorded Additional Information for Danger to Others Potential: No data recorded Additional Comments for Danger to Others Potential: No data recorded Are There Guns or Other Weapons in Your Home? No data recorded Types of Guns/Weapons: No data recorded  Are These Weapons Safely Secured?                            No data recorded Who Could Verify You Are Able To Have These Secured: No  data recorded Do You Have any Outstanding Charges, Pending Court Dates, Parole/Probation? No data recorded Contacted To Inform of Risk of Harm To Self or Others: No data recorded  Location of Assessment: No data recorded  Does Patient Present under Involuntary Commitment? No data recorded IVC Papers Initial File Date: No data recorded  [place] of Residence: No data recorded  Patient Currently Receiving the Following Services: No data recorded  Determination of Need: No data recorded  Options For Referral: No data recorded    CCA Biopsychosocial Intake/Chief Complaint:  The patient notes being refered by the patients PCP Dr. Kennis Carina for Anxiety  Current Symptoms/Problems: Difficulty with Anxiousness, anxiety that comes and goes , gotten worse in the past 5years.   Patient Reported Schizophrenia/Schizoaffective Diagnosis in Past: No   Strengths: Very good with sports/athletics, works well under pressure, loyal, and  Preferences: collecting sports memoriabilia , yardwork, time with spouse, watching sports, time with children.  Abilities: Sports   Type of Services Patient Feels are Needed: Medication Management, and Individual Therapy   Initial Clinical Notes/Concerns: No prior mental health problems, No prior hospitalizations, no current S/I or H/I   Mental Health Symptoms Depression:  None   Duration of Depressive symptoms: No data recorded  Mania:  None   Anxiety:   Fatigue; Restlessness; Worrying; Sleep   Psychosis:  None   Duration of Psychotic symptoms: No data recorded  Trauma:  None   Obsessions:  None   Compulsions:  None   Inattention:  None   Hyperactivity/Impulsivity:  N/A   Oppositional/Defiant Behaviors:  None   Emotional Irregularity:  None   Other Mood/Personality Symptoms:  No Additional    Mental Status Exam Appearance and self-care  Stature:  Average   Weight:  Overweight   Clothing:  Casual   Grooming:  Normal   Cosmetic use:   None   Posture/gait:  Normal   Motor activity:  Not Remarkable   Sensorium  Attention:  Normal   Concentration:  Anxiety interferes   Orientation:  X5   Recall/memory:  Normal   Affect and Mood  Affect:  Appropriate   Mood:  Anxious   Relating  Eye contact:  Normal   Facial expression:  Anxious   Attitude toward examiner:  Cooperative   Thought and Language  Speech flow: Normal   Thought content:  Appropriate to Mood and Circumstances   Preoccupation:  None   Hallucinations:  None   Organization:  Logical  Company secretary of Knowledge:  Good   Intelligence:  Average   Abstraction:  Normal   Judgement:  Good   Reality Testing:  Realistic   Insight:  Good   Decision Making:  Normal   Social Functioning  Social Maturity:  Responsible   Social Judgement:  Normal   Stress  Stressors:  -- (None identified)   Coping Ability:  Normal   Skill Deficits:  None   Supports:  Family; Friends/Service system     Religion: Religion/Spirituality Are You A Religious Person?: Yes What is Your Religious Affiliation?: Christian How Might This Affect Treatment?: protective factor  Leisure/Recreation: Leisure / Recreation Do You Have Hobbies?: Yes Leisure and Hobbies: Sports  Exercise/Diet: Exercise/Diet Do You Exercise?: Yes  What Type of Exercise Do You Do?: Run/Walk How Many Times a Week Do You Exercise?: 1-3 times a week Have You Gained or Lost A Significant Amount of Weight in the Past Six Months?: Yes-Lost Number of Pounds Lost?: 17 (Currently working with a nutritionist) Do You Follow a Special Diet?: No Do You Have Any Trouble Sleeping?: Yes Explanation of Sleeping Difficulties: Difficulty staying asleep   CCA Employment/Education Employment/Work Situation: Employment / Work Situation Employment situation: Employed 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 long has patient been employed?:  18 Patient's job has been impacted by current illness: No What is the longest time patient has a held a job?: Same as above Where was the patient employed at that time?: Same as above Has patient ever been in the [employer]?: No  Education: Education Is Patient Currently Attending School?: No Last Grade Completed: 12 Name of High School: [employer] Did Garment/textile technologist From [employer]?: Yes Did Theme park manager?: Yes What Type of College Degree Do you Have?: BA in [employer] Management Did You Attend Graduate School?: No What Was Your Major?: NA Did You Have Any Special Interests In School?: Education Did You Have An Individualized Education Program (IIEP): No Did You Have Any Difficulty At School?: No Patient's Education Has Been Impacted by Current Illness: No   CCA Family/Childhood History Family and Relationship History: Family history Marital status: Married Number of Years Married: 28 What types of issues is patient dealing with in the relationship?: None Additional relationship information: None Are you sexually active?: Yes What is your sexual orientation?: Heterosexual Has your sexual activity been affected by drugs, alcohol, medication, or emotional stress?: NA Does patient have children?: Yes How many children?: 2 How is patient's relationship with their children?: The patient notes having a good relationship with his children.  Childhood History:  Childhood History By whom was/is the patient raised?: Both parents Additional childhood history information: None Description of patient's relationship with caregiver when they were a child: Wonderful relationship with both parents Patient's description of current relationship with people who raised him/her: Both deceased How were you disciplined when you got in trouble as a child/adolescent?: Spanking Does patient have siblings?: Yes Number of Siblings: 1 Description of patient's current relationship with  siblings: The patient noted having a great relationship with his brother Did patient suffer any verbal/emotional/physical/sexual abuse as a child?: No Did patient suffer from severe childhood neglect?: No Has patient ever been sexually abused/assaulted/raped as an adolescent or adult?: No Was the patient ever a victim of a crime or a disaster?: No Witnessed domestic violence?: No Has patient been affected by domestic violence as an adult?: No  Child/Adolescent Assessment:     CCA Substance Use Alcohol/Drug Use: Alcohol / Drug Use Pain Medications: See MAR Prescriptions: See MAR Over the Counter: None History of alcohol / drug use?: No history of alcohol / drug abuse Longest period of sobriety (when/how long): NA                         ASAM's:  Six Dimensions of Multidimensional Assessment  Dimension 1:  Acute Intoxication and/or Withdrawal Potential:      Dimension 2:  Biomedical Conditions and Complications:      Dimension 3:  Emotional, Behavioral, or Cognitive Conditions and Complications:     Dimension 4:  Readiness to Change:     Dimension 5:  Relapse, Continued use, or Continued Problem Potential:  Dimension 6:  Recovery/Living Environment:     ASAM Severity Score:    ASAM Recommended Level of Treatment:     Substance use Disorder (SUD)    Recommendations for Services/Supports/Treatments: Recommendations for Services/Supports/Treatments Recommendations For Services/Supports/Treatments: Individual Therapy,Medication Management  DSM5 Diagnoses: Patient Active Problem List   Diagnosis Date Noted  . RUQ pain 11/15/2019  . Sciatica, left side 06/24/2019  . Carpal tunnel syndrome of left wrist 06/24/2019  . Tension headache 06/24/2019  . Abnormal nuclear stress test   . Bloating 11/11/2018  . Gall bladder polyp 11/11/2018  . Pain of left hip joint 04/06/2018  . History of rectal fissure   . Musculoskeletal chest pain 09/29/2017  . Abdominal pain,  epigastric 09/29/2017  . Skin tag of perianal region 08/26/2017  . Special screening for malignant neoplasms, colon 04/30/2016  . Chronic low back pain 09/09/2012  . Fissure in ano 02/24/2012  . Abdominal pain, acute, right upper quadrant 08/04/2011    Patient Centered Plan: Patient is on the following Treatment Plan(s):  GAD   Referrals to Alternative Service(s): Referred to Alternative Service(s):   Place:   Date:   Time:    Referred to Alternative Service(s):   Place:   Date:   Time:    Referred to Alternative Service(s):   Place:   Date:   Time:    Referred to Alternative Service(s):   Place:   Date:   Time:     I discussed the assessment and treatment plan with the patient. The patient was provided an opportunity to ask questions and all were answered. The patient agreed with the plan and demonstrated an understanding of the instructions.   The patient was advised to call back or seek an in-person evaluation if the symptoms worsen or if the condition fails to improve as anticipated.  I provided 60 minutes of non-face-to-face time during this encounter.  Winfred Burn, LCSW   06/04/2020

## 2020-06-06 NOTE — Progress Notes (Signed)
[name] [address] [place] [address] Phone: [phone] Subjective:   Justin Estrada, am serving as a scribe for Dr. [name]. This visit occurred during the SARS-CoV-2 public health emergency.  Safety protocols were in place, including screening questions prior to the visit, additional usage of staff PPE, and extensive cleaning of exam room while observing appropriate contact time as indicated for disinfecting solutions.   I'm seeing this patient by the request  of:  Babs Sciara, MD  CC: Left pectoral pain and arm pain  TSV:[id number]   04/30/2020 Patient does not know if this is really contributing to the pain.  Patient noted is feeling better.  Wants to know if he can get off the gabapentin.  No side effects but does not want to take it regularly.  Patient is feeling much better overall and had been doing the bracing at night.  Awaiting nerve conduction study.  Follow-up again in 4 to 6 weeks  Patient has been making improvement at this time.  Discussed with patient about the gabapentin.  Still could be potentially from the carpal tunnel and is still awaiting the nerve conduction study which is scheduled at the end of this month.  Patient will start to titrate off the gabapentin and see how patient responds.  Follow-up with me again 6 weeks  Update 06/07/2020 Justin Estrada is a 55 y.o. male coming in with complaint of chest pain and L wrist pain. Patient states that his wrist and chest pain continues intermittently. Pain when he reaches down vs flexion or abduction.   Does have tingling in tips of fingers and will have hand numbness at night but little to no pain patient did have a nerve conduction study done.  Nerve conduction did show the patient had more of an ulnar neuropathy noted at the elbow aspect.  Moderate in severity.  Neck seem to be relatively fine.  Patient has tried some chiropractic care with very minimal improvement at  this time.  Patient states that the pain in the chest is much more intermittent and not as severe when it does occur.  Still though having some of the arm pain on a more regular basis.    Past Medical History:  Diagnosis Date  . Anal fissure   . History of cardiac catheterization    Normal coronary arteries January 2021   Past Surgical History:  Procedure Laterality Date  . BIOPSY  10/12/2017   Procedure: BIOPSY;  Surgeon: Malissa Hippo, MD;  [facility]: [facility];  Service: Endoscopy;;  antral biopsy   . COLONOSCOPY     10 plus years ago by Dr.Rourk  . COLONOSCOPY N/A 07/28/2016   Procedure: COLONOSCOPY;  Surgeon: Malissa Hippo, MD;  Location: [facility];  Service: Endoscopy;  Laterality: N/A;  730-rescheduled to 6/4 same time per Dewayne Hatch  . ESOPHAGOGASTRODUODENOSCOPY N/A 10/12/2017   Procedure: ESOPHAGOGASTRODUODENOSCOPY (EGD);  Surgeon: Malissa Hippo, MD;  Location: [facility];  Service: Endoscopy;  Laterality: N/A;  730  . EXCISION OF SKIN TAG N/A 09/30/2017   Procedure: EXCISION OF PERIANAL SKIN TAGS;  Surgeon: Lucretia Roers, MD;  Location: [facility];  Service: General;  Laterality: N/A;  . foot surgery  for bone spurs Bilateral   . LEFT HEART CATH AND CORONARY ANGIOGRAPHY N/A 03/11/2019   Procedure: LEFT HEART CATH AND CORONARY ANGIOGRAPHY;  Surgeon: Lyn Records, MD;  Location: [facility];  Service: Cardiovascular;  Laterality: N/A;  . UPPER GASTROINTESTINAL ENDOSCOPY     10 plus years ago by Dr.Rourk   Social History   Socioeconomic History  . Marital status: Married    Spouse name: Jama  . Number of children: 2  . Years of education: 16  . Highest education level: Bachelor's degree (e.g., BA, AB, BS)  Occupational History  . Occupation: Magazine features editor: [employer]  Tobacco Use  . Smoking status: Never Smoker  . Smokeless tobacco: Never Used  Vaping Use  . Vaping Use: Never used  Substance and Sexual Activity  . Alcohol use: No     Alcohol/week: 0.0 standard drinks  . Drug use: No  . Sexual activity: Yes    Birth control/protection: None  Other Topics Concern  . Not on file  Social History Narrative   Patient is right-handed. He lives with his wife and 2 children in a 2 level home with a  Basement. He drinks two cups of coffee and one 16 oz bottle of soda daily. He walks occasionally.   Social Determinants of Health   Financial Resource Strain: Not on file  Food Insecurity: Not on file  Transportation Needs: Not on file  Physical Activity: Not on file  Stress: Not on file  Social Connections: Not on file   No Known Allergies Family History  Problem Relation Age of Onset  . Hypertension Mother   . Lung cancer Mother   . Liver cancer Mother   . Hypertension Father   . Congestive Heart Failure Father   . Atrial fibrillation Brother      Current Outpatient Medications (Cardiovascular):  .  diltiazem 2 % GEL*, Apply 1 application topically at bedtime as needed (anal fissure). Use as directed.     Current Outpatient Medications (Other):  .  diltiazem 2 % GEL*, Apply 1 application topically at bedtime as needed (anal fissure). Use as directed. .  docusate sodium (COLACE) 100 MG capsule, Take 1 capsule (100 mg total) by mouth 2 (two) times daily. (Patient taking differently: Take 100 mg by mouth at bedtime.) .  FLUoxetine (PROZAC) 10 MG capsule, Take 1 capsule (10 mg total) by mouth daily. .  psyllium (METAMUCIL SMOOTH TEXTURE) 28 % packet, Take 1 packet by mouth at bedtime. .  topiramate (TOPAMAX) 50 MG tablet, Take 1 tablet (50 mg total) by mouth 2 (two) times daily. * These medications belong to multiple therapeutic classes and are listed under each applicable group.   Reviewed prior external information including notes and imaging from  primary care provider As well as notes that were available from care everywhere and other healthcare systems.  Past medical history, social, surgical and family  history all reviewed in electronic medical record.  No pertanent information unless stated regarding to the chief complaint.   Review of Systems:  No headache, visual changes, nausea, vomiting, diarrhea, constipation, dizziness, abdominal pain, skin rash, fevers, chills, night sweats, weight loss, swollen lymph nodes, body aches, joint swelling, chest pain, shortness of breath, mood changes. POSITIVE muscle aches  Objective  Blood pressure 110/70, pulse 78, height 5\' 11"  (1.803 m), SpO2 98 %.   General: No apparent distress alert and oriented x3 mood and affect normal, dressed appropriately.  HEENT: Pupils equal, extraocular movements intact  Respiratory: Patient's speak in full sentences and does not appear short of breath  Cardiovascular: No lower extremity edema, non tender, no erythema  Gait normal with good balance and coordination.  MSK:  Neck exam  does have some mild loss of lordosis.  Some tenderness to palpation of the paraspinal musculature. Patient actually does have more of an ulnar neuropathy with a positive Tinel's at the elbow.  Continues positive Tinel's at the wrist as well.  Patient does have good range of motion of the wrist.  Full range of motion of the shoulder with no significant difficulties.  Osteopathic findings C4 flexed rotated and side bent right C7 flexed rotated and side bent right T5 extended rotated and side bent left   Impression and Recommendations:     The above documentation has been reviewed and is accurate and complete Judi Saa, DO

## 2020-06-07 ENCOUNTER — Encounter: Payer: Self-pay | Admitting: Family Medicine

## 2020-06-07 ENCOUNTER — Ambulatory Visit: Payer: Self-pay

## 2020-06-07 ENCOUNTER — Encounter (HOSPITAL_COMMUNITY): Payer: Self-pay

## 2020-06-07 ENCOUNTER — Other Ambulatory Visit: Payer: Self-pay

## 2020-06-07 ENCOUNTER — Ambulatory Visit: Payer: BC Managed Care – PPO | Admitting: Family Medicine

## 2020-06-07 VITALS — BP 110/70 | HR 78 | Ht 71.0 in

## 2020-06-07 DIAGNOSIS — G5622 Lesion of ulnar nerve, left upper limb: Secondary | ICD-10-CM | POA: Diagnosis not present

## 2020-06-07 DIAGNOSIS — G5602 Carpal tunnel syndrome, left upper limb: Secondary | ICD-10-CM

## 2020-06-07 DIAGNOSIS — M9901 Segmental and somatic dysfunction of cervical region: Secondary | ICD-10-CM | POA: Diagnosis not present

## 2020-06-07 DIAGNOSIS — M9902 Segmental and somatic dysfunction of thoracic region: Secondary | ICD-10-CM

## 2020-06-07 DIAGNOSIS — R0789 Other chest pain: Secondary | ICD-10-CM | POA: Diagnosis not present

## 2020-06-07 NOTE — Assessment & Plan Note (Signed)
Patient continues to have difficulty but is not having as frequent or as severe.  Seems to be coming less.  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  Attempted osteopathic manipulation given different exercises for scapular stability that could also help with extending and taking more the pressure off.  Patient has been checked out by other specialists including cardiology but did not feel it is heart related at this point.  Patient does know though if any significant shortness of breath or chest pain to seek medical attention immediately.  Follow-up with me again in 6 to 8 weeks

## 2020-06-07 NOTE — Assessment & Plan Note (Signed)
We discussed compression.  Could be having radicular symptoms going both ways that could be contributing to some of the pain.  Discussed with patient about icing regimen and home exercises.  Discussed potential compression sleeve.  Follow-up again in 4 to 8 weeks.  At that point if continuing to have difficulty consider the possibility of injection

## 2020-06-07 NOTE — Patient Instructions (Signed)
Great to see you  Compression sleeve to the elbow.  New exercises for the ulnar nerve  Tried some mild manipulation on the neck and may help  See me again in 6 weeks and if not better will consider injection

## 2020-06-25 ENCOUNTER — Other Ambulatory Visit (INDEPENDENT_AMBULATORY_CARE_PROVIDER_SITE_OTHER): Payer: Self-pay | Admitting: Gastroenterology

## 2020-06-25 ENCOUNTER — Encounter (INDEPENDENT_AMBULATORY_CARE_PROVIDER_SITE_OTHER): Payer: Self-pay

## 2020-06-25 DIAGNOSIS — K602 Anal fissure, unspecified: Secondary | ICD-10-CM

## 2020-06-25 MED ORDER — DILTIAZEM GEL 2 %: 1.0000 "application " | Freq: Two times a day (BID) | CUTANEOUS | 0 refills | Status: DC

## 2020-06-27 ENCOUNTER — Ambulatory Visit: Payer: BC Managed Care – PPO | Admitting: Family Medicine

## 2020-07-02 ENCOUNTER — Telehealth: Payer: Self-pay | Admitting: Family Medicine

## 2020-07-02 ENCOUNTER — Other Ambulatory Visit: Payer: Self-pay

## 2020-07-02 ENCOUNTER — Ambulatory Visit (HOSPITAL_COMMUNITY): Payer: BC Managed Care – PPO | Admitting: Clinical

## 2020-07-02 DIAGNOSIS — E559 Vitamin D deficiency, unspecified: Secondary | ICD-10-CM

## 2020-07-02 DIAGNOSIS — Z0000 Encounter for general adult medical examination without abnormal findings: Secondary | ICD-10-CM

## 2020-07-02 NOTE — Telephone Encounter (Signed)
Patient has appointment 6/21 for physical and needing labs done

## 2020-07-02 NOTE — Telephone Encounter (Signed)
Notified patient labs have been ordered to be done 1 week prior to physical .

## 2020-07-10 ENCOUNTER — Other Ambulatory Visit: Payer: Self-pay

## 2020-07-10 ENCOUNTER — Telehealth: Payer: BC Managed Care – PPO | Admitting: Family Medicine

## 2020-07-10 DIAGNOSIS — F411 Generalized anxiety disorder: Secondary | ICD-10-CM

## 2020-07-10 NOTE — Progress Notes (Signed)
   Subjective:    Patient ID: Justin Estrada, male    DOB: 1965/03/20, 55 y.o.   MRN: [id number]  HPI Virtual Visit via Video Note Today was spent discussing recent events patient states he is actually feeling much better.  Less anxiety.  Tolerating Prozac well Denies being depressed Helps him with his anxiousness States his headaches are only intermittent currently and doing much better Never took the Topamax did not feel it helped any the first week he took it  I connected with Justin Estrada on 07/10/20 at  4:10 PM EDT by a video enabled telemedicine application and verified that I am speaking with the correct person using two identifiers.  Location: Patient: Home Provider: Office   I discussed the limitations of evaluation and management by telemedicine and the availability of in person appointments. The patient expressed understanding and agreed to proceed.  History of Present Illness:    Observations/Objective:   Assessment and Plan:   Follow Up Instructions:    I discussed the assessment and treatment plan with the patient. The patient was provided an opportunity to ask questions and all were answered. The patient agreed with the plan and demonstrated an understanding of the instructions.   The patient was advised to call back or seek an in-person evaluation if the symptoms worsen or if the condition fails to improve as anticipated.  I provided 12 minutes of non-face-to-face time during this encounter.   Lilyan Punt, MD     Review of Systems     Objective:   Physical Exam  Today's visit was via telephone Physical exam was not possible for this visit   He will let us know if there is any problems otherwise he will follow-up for his wellness in the summer    Assessment & Plan:  Mild anxiety Patient did not feel counseling helped Prozac is helping He will continue this He will follow-up for his wellness in the summer In addition to this he did have his  sinuses checked out everything seemed to be fine on CAT scan

## 2020-07-12 ENCOUNTER — Ambulatory Visit: Payer: BC Managed Care – PPO | Admitting: Family Medicine

## 2020-08-02 NOTE — Progress Notes (Signed)
[name] [address] [place] [address] Phone: [phone] Subjective:   I Ronelle Nigh am serving as a Neurosurgeon for Dr. [name].  This visit occurred during the SARS-CoV-2 public health emergency.  Safety protocols were in place, including screening questions prior to the visit, additional usage of staff PPE, and extensive cleaning of exam room while observing appropriate contact time as indicated for disinfecting solutions.   I'm seeing this patient by the request  of:  Babs Sciara, MD  CC: Muscular skeletal chest pain and elbow pain follow-up  MOQ:[id number]  06/07/2020 Patient continues to have difficulty but is not having as frequent or as severe.  Seems to be coming less.  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  Attempted osteopathic manipulation given different exercises for scapular stability that could also help with extending and taking more the pressure off.  Patient has been checked out by other specialists including cardiology but did not feel it is heart related at this point.  Patient does know though if any significant shortness of breath or chest pain to seek medical attention immediately.  Follow-up with me again in 6 to 8 weeks  We discussed compression.  Could be having radicular symptoms going both ways that could be contributing to some of the pain.  Discussed with patient about icing regimen and home exercises.  Discussed potential compression sleeve.  Follow-up again in 4 to 8 weeks.  At that point if continuing to have difficulty consider the possibility of injection  Update 08/03/2020 HARLEM BULA is a 55 y.o. male coming in with complaint of left elbow and chest pain. Patient states the elbow is doing better. Chest is sporadic. Still bothering him at times. Patient states that the elbow is about 80% better.  Able  to do more activities.  Not having as much pain.  Compression has been helpful.  Patient continues to have the intermittent musculoskeletal chest pain.  Patient is more aware of it than truly having any significant pain.  Not stopping him from anything.      Past Medical History:  Diagnosis Date   Anal fissure    History of cardiac catheterization    Normal coronary arteries January 2021   Past Surgical History:  Procedure Laterality Date   BIOPSY  10/12/2017   Procedure: BIOPSY;  Surgeon: Malissa Hippo, MD;  Location: [facility];  Service: Endoscopy;;  antral biopsy    COLONOSCOPY     10 plus years ago by Dr.Rourk   COLONOSCOPY N/A 07/28/2016   Procedure: COLONOSCOPY;  Surgeon: Malissa Hippo, MD;  Location: [facility];  Service: Endoscopy;  Laterality: N/A;  730-rescheduled to 6/4 same time per Ann   ESOPHAGOGASTRODUODENOSCOPY N/A 10/12/2017   Procedure: ESOPHAGOGASTRODUODENOSCOPY (EGD);  Surgeon: Malissa Hippo, MD;  Location: [facility];  Service: Endoscopy;  Laterality: N/A;  730   EXCISION OF SKIN TAG N/A 09/30/2017   Procedure: EXCISION OF PERIANAL SKIN TAGS;  Surgeon: Lucretia Roers, MD;  Location: [facility];  Service: General;  Laterality: N/A;   foot surgery  for bone spurs Bilateral    LEFT HEART CATH AND CORONARY ANGIOGRAPHY N/A 03/11/2019   Procedure: LEFT HEART CATH AND CORONARY ANGIOGRAPHY;  Surgeon: Lyn Records, MD;  [facility]: [facility];  Service: Cardiovascular;  Laterality: N/A;   UPPER GASTROINTESTINAL ENDOSCOPY  10 plus years ago by Dr.Rourk   Social History   Socioeconomic History   Marital status: Married    Spouse name: Jama   Number of children: 2   Years of education: 16   Highest education level: Bachelor's degree (e.g., BA, AB, BS)  Occupational History   Occupation: Magazine features editor: [employer]  Tobacco Use   Smoking status: Never   Smokeless tobacco: Never  Vaping Use   Vaping Use: Never used  Substance  and Sexual Activity   Alcohol use: No    Alcohol/week: 0.0 standard drinks   Drug use: No   Sexual activity: Yes    Birth control/protection: None  Other Topics Concern   Not on file  Social History Narrative   Patient is right-handed. He lives with his wife and 2 children in a 2 level home with a  Basement. He drinks two cups of coffee and one 16 oz bottle of soda daily. He walks occasionally.   Social Determinants of Health   Financial Resource Strain: Not on file  Food Insecurity: Not on file  Transportation Needs: Not on file  Physical Activity: Not on file  Stress: Not on file  Social Connections: Not on file   No Known Allergies Family History  Problem Relation Age of Onset   Hypertension Mother    Lung cancer Mother    Liver cancer Mother    Hypertension Father    Congestive Heart Failure Father    Atrial fibrillation Brother      Current Outpatient Medications (Cardiovascular):    diltiazem 2 % GEL*, Apply 1 application topically 2 (two) times daily. Use as directed.     Current Outpatient Medications (Other):    diltiazem 2 % GEL*, Apply 1 application topically 2 (two) times daily. Use as directed.   docusate sodium (COLACE) 100 MG capsule, Take 1 capsule (100 mg total) by mouth 2 (two) times daily. (Patient taking differently: Take 100 mg by mouth at bedtime.)   FLUoxetine (PROZAC) 10 MG capsule, Take 1 capsule (10 mg total) by mouth daily.   psyllium (METAMUCIL SMOOTH TEXTURE) 28 % packet, Take 1 packet by mouth at bedtime. * These medications belong to multiple therapeutic classes and are listed under each applicable group.   Reviewed prior external information including notes and imaging from  primary care provider As well as notes that were available from care everywhere and other healthcare systems.  Past medical history, social, surgical and family history all reviewed in electronic medical record.  No pertanent information unless stated regarding to  the chief complaint.   Review of Systems:  No headache, visual changes, nausea, vomiting, diarrhea, constipation, dizziness, abdominal pain, skin rash, fevers, chills, night sweats, weight loss, swollen lymph nodes, body aches, joint swelling, chest pain, shortness of breath, mood changes. POSITIVE muscle aches in chest   Objective  Blood pressure 130/80, pulse 66, height 5\' 11"  (1.803 m), weight 235 lb (106.6 kg), SpO2 97 %.   General: No apparent distress alert and oriented x3 mood and affect normal, dressed appropriately.  HEENT: Pupils equal, extraocular movements intact  Respiratory: Patient's speak in full sentences and does not appear short of breath  Cardiovascular: No lower extremity edema, non tender, no erythema  Gait normal with good balance and coordination.  MSK: On exam patient's right elbow does have full range of motion.  Still tender over the ulnar nerve on the medial aspect.  Mild positive Tinel's.  Good grip strength are  noted.  No pain with resisted flexion of the forearm. Patient's chest exam shows an area where questionable irritation is.  Seems to be more over the fourth rib on the left side of the chest just medial to the nipple line.  No true mass appreciated.    Impression and Recommendations:     The above documentation has been reviewed and is accurate and complete Judi Saa, DO

## 2020-08-03 ENCOUNTER — Other Ambulatory Visit: Payer: Self-pay

## 2020-08-03 ENCOUNTER — Ambulatory Visit: Payer: BC Managed Care – PPO | Admitting: Family Medicine

## 2020-08-03 ENCOUNTER — Encounter: Payer: Self-pay | Admitting: Family Medicine

## 2020-08-03 DIAGNOSIS — G5622 Lesion of ulnar nerve, left upper limb: Secondary | ICD-10-CM | POA: Diagnosis not present

## 2020-08-03 DIAGNOSIS — R0789 Other chest pain: Secondary | ICD-10-CM | POA: Diagnosis not present

## 2020-08-03 NOTE — Patient Instructions (Addendum)
Good to see you Elbow is getting better continue everything Continue to watch the chest Increase in pain stop doing activity Consider MRI See me again in 2 months

## 2020-08-03 NOTE — Assessment & Plan Note (Signed)
Patient is making progress.  Continue with compression and monitoring.  Continue to avoid significant impact in the area.  Follow-up with me again in 2 months

## 2020-08-03 NOTE — Assessment & Plan Note (Signed)
Discussed again with patient at great length.  Patient has had work-up with cardiology but did not find anything.  Patient is getting some relief with patient is working with a massage therapist.  Did not get much therapy for improvement with the manipulation.  Patient does have the ulnar neuropathy that could be contributing as well.  Discussed only other imaging would be an MRI of the chest which we could discuss.  Patient still feels that the frequency and severity may be getting better and is not stopping him from activity or waking him up.  We will continue to monitor and have another check in in 2 months

## 2020-08-08 LAB — BASIC METABOLIC PANEL
BUN/Creatinine Ratio: 13 (ref 9–20)
BUN: 14 mg/dL (ref 6–24)
CO2: 22 mmol/L (ref 20–29)
Calcium: 9.2 mg/dL (ref 8.7–10.2)
Chloride: 104 mmol/L (ref 96–106)
Creatinine, Ser: 1.07 mg/dL (ref 0.76–1.27)
Glucose: 91 mg/dL (ref 65–99)
Potassium: 4.9 mmol/L (ref 3.5–5.2)
Sodium: 142 mmol/L (ref 134–144)
eGFR: 82 mL/min/{1.73_m2} (ref >59)

## 2020-08-08 LAB — PSA: Prostate Specific Ag, Serum: 0.4 ng/mL (ref 0.0–4.0)

## 2020-08-08 LAB — LIPID PANEL
Chol/HDL Ratio: 4.6 ratio (ref 0.0–5.0)
Cholesterol, Total: 187 mg/dL (ref 100–199)
HDL: 41 mg/dL (ref >39)
LDL Chol Calc (NIH): 123 mg/dL — ABNORMAL HIGH (ref 0–99)
Triglycerides: 127 mg/dL (ref 0–149)
VLDL Cholesterol Cal: 23 mg/dL (ref 5–40)

## 2020-08-08 LAB — VITAMIN D 25 HYDROXY (VIT D DEFICIENCY, FRACTURES): Vit D, 25-Hydroxy: 37.4 ng/mL (ref 30.0–100.0)

## 2020-08-14 ENCOUNTER — Ambulatory Visit (INDEPENDENT_AMBULATORY_CARE_PROVIDER_SITE_OTHER): Payer: BC Managed Care – PPO | Admitting: Family Medicine

## 2020-08-14 ENCOUNTER — Other Ambulatory Visit: Payer: Self-pay

## 2020-08-14 ENCOUNTER — Encounter: Payer: Self-pay | Admitting: Family Medicine

## 2020-08-14 VITALS — BP 128/80 | HR 73 | Temp 97.3°F | Ht 71.0 in | Wt 234.0 lb

## 2020-08-14 DIAGNOSIS — Z0000 Encounter for general adult medical examination without abnormal findings: Secondary | ICD-10-CM

## 2020-08-14 MED ORDER — FLUOXETINE HCL 10 MG PO CAPS: 10.0000 mg | ORAL_CAPSULE | Freq: Every day | ORAL | 1 refills | Status: DC

## 2020-08-14 NOTE — Patient Instructions (Signed)

## 2020-08-14 NOTE — Progress Notes (Signed)
   Subjective:    Patient ID: Justin Estrada, male    DOB: [birth year], 55 y.o.   MRN: [id number]  HPI The patient comes in today for a wellness visit.  Patient has had some intermittent left chest pain and discomfort being followed by physical medicine specialist.  Seen some improvement In the past he had a negative cardiac catheterization.  A review of their health history was completed.  A review of medications was also completed.  Any needed refills; update meds. Would like to change to 90 day on fluoxetine.   Eating habits: health conscious  Falls/  MVA accidents in past few months: none  Regular exercise: walking  Specialist pt sees on regular basis: none  Preventative health issues were discussed.   Additional concerns: none Labs reviewed with patient today including mild elevation of cholesterol not normal range that requires medicine currently  Review of Systems     Objective:   Physical Exam  General-in no acute distress Eyes-no discharge Lungs-respiratory rate normal, CTA CV-no murmurs,RRR Extremities skin warm dry no edema Neuro grossly normal Behavior normal, alert       Assessment & Plan:   Adult wellness-complete.wellness physical was conducted today. Importance of diet and exercise were discussed in detail.  In addition to this a discussion regarding safety was also covered. We also reviewed over immunizations and gave recommendations regarding current immunization needed for age.  In addition to this additional areas were also touched on including: Preventative health exams needed:  Colonoscopy Next 03/15/2026  Patient was advised yearly wellness exam Labs reviewed healthy diet recommended Mild weight loss with exercise continue healthy habits and exercise Follow-up again in 6 months Prozac is helping underlying stress and anxiety

## 2020-10-02 NOTE — Progress Notes (Signed)
[facility] [address] [place] [address] Phone: [phone] Subjective:    I'm seeing this patient by the request  of:  Babs Sciara, MD  CC: Chest discomfort and elbow pain follow-up  HER:[id number]  08/03/2020 Patient is making progress.  Continue with compression and monitoring.  Continue to avoid significant impact in the area.  Follow-up with me again in 2 months  Discussed again with patient at great length.  Patient has had work-up with cardiology but did not find anything.  Patient is getting some relief with patient is working with a massage therapist.  Did not get much therapy for improvement with the manipulation.  Patient does have the ulnar neuropathy that could be contributing as well.  Discussed only other imaging would be an MRI of the chest which we could discuss.  Patient still feels that the frequency and severity may be getting better and is not stopping him from activity or waking him up.  We will continue to monitor and have another check in in 2 months  Update 10/02/2020 Justin Estrada is a 55 y.o. male coming in with complaint of L elbow and musculoskeletal chest pain. Patient states that the chest pain is very intermittent but now the pain is more so in the L armpit. States L Elbow is the same as last time with the intermittent hand/finger numbness.  Patient states that it is very intermittent.  Has days where he does well without even thinking of either of these problems.  Patient states that he does sit with his elbow against something he starts having more of a tingling.      Past Medical History:  Diagnosis Date   Anal fissure    History of cardiac catheterization    Normal coronary arteries January 2021   Past Surgical History:  Procedure Laterality Date   BIOPSY  10/12/2017   Procedure: BIOPSY;  Surgeon: Malissa Hippo, MD;  [facility];  Service: Endoscopy;;  antral biopsy    COLONOSCOPY     10 plus  years ago by Dr.Rourk   COLONOSCOPY N/A 07/28/2016   Procedure: COLONOSCOPY;  Surgeon: Malissa Hippo, MD;  [facility];  Service: Endoscopy;  Laterality: N/A;  730-rescheduled to 6/4 same time per Ann   ESOPHAGOGASTRODUODENOSCOPY N/A 10/12/2017   Procedure: ESOPHAGOGASTRODUODENOSCOPY (EGD);  Surgeon: Malissa Hippo, MD;  [facility];  Service: Endoscopy;  Laterality: N/A;  730   EXCISION OF SKIN TAG N/A 09/30/2017   Procedure: EXCISION OF PERIANAL SKIN TAGS;  Surgeon: Lucretia Roers, MD;  Location: [facility];  Service: General;  Laterality: N/A;   foot surgery  for bone spurs Bilateral    LEFT HEART CATH AND CORONARY ANGIOGRAPHY N/A 03/11/2019   Procedure: LEFT HEART CATH AND CORONARY ANGIOGRAPHY;  Surgeon: Lyn Records, MD;  [facility]: [facility];  Service: Cardiovascular;  Laterality: N/A;   UPPER GASTROINTESTINAL ENDOSCOPY     10 plus years ago by Dr.Rourk   Social History   Socioeconomic History   Marital status: Married    Spouse name: Jama   Number of children: 2   Years of education: 16   Highest education level: Bachelor's degree (e.g., BA, AB, BS)  Occupational History   Occupation: Magazine features editor: [employer]  Tobacco Use   Smoking status: Never   Smokeless tobacco: Never  Vaping Use   Vaping Use: Never used  Substance and  Sexual Activity   Alcohol use: No    Alcohol/week: 0.0 standard drinks   Drug use: No   Sexual activity: Yes    Birth control/protection: None  Other Topics Concern   Not on file  Social History Narrative   Patient is right-handed. He lives with his wife and 2 children in a 2 level home with a  Basement. He drinks two cups of coffee and one 16 oz bottle of soda daily. He walks occasionally.   Social Determinants of Health   Financial Resource Strain: Not on file  Food Insecurity: Not on file  Transportation Needs: Not on file  Physical Activity: Not on file  Stress: Not on file  Social  Connections: Not on file   No Known Allergies Family History  Problem Relation Age of Onset   Hypertension Mother    Lung cancer Mother    Liver cancer Mother    Hypertension Father    Congestive Heart Failure Father    Atrial fibrillation Brother      Current Outpatient Medications (Cardiovascular):    diltiazem 2 % GEL*, Apply 1 application topically 2 (two) times daily. Use as directed.     Current Outpatient Medications (Other):    diltiazem 2 % GEL*, Apply 1 application topically 2 (two) times daily. Use as directed.   docusate sodium (COLACE) 100 MG capsule, Take 1 capsule (100 mg total) by mouth 2 (two) times daily. (Patient taking differently: Take 100 mg by mouth at bedtime.)   FLUoxetine (PROZAC) 10 MG capsule, Take 1 capsule (10 mg total) by mouth daily.   psyllium (METAMUCIL SMOOTH TEXTURE) 28 % packet, Take 1 packet by mouth at bedtime. * These medications belong to multiple therapeutic classes and are listed under each applicable group.   Reviewed prior external information including notes and imaging from  primary care provider As well as notes that were available from care everywhere and other healthcare systems.  Past medical history, social, surgical and family history all reviewed in electronic medical record.  No pertanent information unless stated regarding to the chief complaint.   Review of Systems:  No headache, visual changes, nausea, vomiting, diarrhea, constipation, dizziness, abdominal pain, skin rash, fevers, chills, night sweats, weight loss, swollen lymph nodes, body aches, joint swelling, chest pain, shortness of breath, mood changes. POSITIVE muscle aches  Objective  Blood pressure 122/62, pulse 72, height 5\' 11"  (1.803 m), weight 239 lb (108.4 kg), SpO2 98 %.   General: No apparent distress alert and oriented x3 mood and affect normal, dressed appropriately.  HEENT: Pupils equal, extraocular movements intact  Respiratory: Patient's speak in  full sentences and does not appear short of breath  Cardiovascular: No lower extremity edema, non tender, no erythema  Gait normal with good balance and coordination.  MSK:  Non tender with full range of motion and good stability and symmetric strength and tone of shoulders,  wrist, hip, knee and ankles bilaterally.  Patient lacks last 2 degrees of extension of the left elbow.  Patient still has a mild positive Tinel's of the left elbow.  Patient noted does have good grip strength noted.    Impression and Recommendations:     The above documentation has been reviewed and is accurate and complete , DO

## 2020-10-03 ENCOUNTER — Other Ambulatory Visit: Payer: Self-pay

## 2020-10-03 ENCOUNTER — Ambulatory Visit: Payer: BC Managed Care – PPO | Admitting: Family Medicine

## 2020-10-03 DIAGNOSIS — G5622 Lesion of ulnar nerve, left upper limb: Secondary | ICD-10-CM | POA: Diagnosis not present

## 2020-10-03 DIAGNOSIS — R0789 Other chest pain: Secondary | ICD-10-CM

## 2020-10-03 NOTE — Assessment & Plan Note (Signed)
Patient continues to have intermittent pain at this time.  Patient though is able to do all daily activities just fine.  Discussed continuing potential compression and avoid putting pressure in the area.  Patient will be starting school and we will see how he does otherwise.  Follow-up with me again as needed as long as patient does not worsen.

## 2020-10-03 NOTE — Patient Instructions (Addendum)
Good to see you I am glad overall things are going well  If things worsen in the next few months come in and see me  See me when you need me

## 2020-10-03 NOTE — Assessment & Plan Note (Signed)
Patient has not had anything significant at this time.  Patient still states that it happens intermittently but seems to be more in the armpit.  Patient does have x-rays of the shoulder showing some mild arthritic changes and we discussed the potential for MRI.  Patient agrees that he does not think that this would change medical management at this time.  Feels like it did get better from when it was very severe and has not worsened at this point so patient is able to continue to monitor.  Follow-up with me again as needed

## 2020-10-24 ENCOUNTER — Encounter: Payer: Self-pay | Admitting: Family Medicine

## 2020-11-01 ENCOUNTER — Ambulatory Visit (INDEPENDENT_AMBULATORY_CARE_PROVIDER_SITE_OTHER): Payer: BC Managed Care – PPO | Admitting: Gastroenterology

## 2020-11-01 ENCOUNTER — Encounter (INDEPENDENT_AMBULATORY_CARE_PROVIDER_SITE_OTHER): Payer: Self-pay | Admitting: Gastroenterology

## 2020-11-01 ENCOUNTER — Other Ambulatory Visit: Payer: Self-pay

## 2020-11-01 VITALS — BP 130/77 | HR 72 | Temp 98.0°F | Ht 71.0 in | Wt 241.8 lb

## 2020-11-01 DIAGNOSIS — K602 Anal fissure, unspecified: Secondary | ICD-10-CM

## 2020-11-01 DIAGNOSIS — R12 Heartburn: Secondary | ICD-10-CM | POA: Insufficient documentation

## 2020-11-01 DIAGNOSIS — R1011 Right upper quadrant pain: Secondary | ICD-10-CM | POA: Diagnosis not present

## 2020-11-01 MED ORDER — DILTIAZEM GEL 2 %: 1.0000 "application " | Freq: Two times a day (BID) | CUTANEOUS | 0 refills | Status: DC

## 2020-11-01 NOTE — Progress Notes (Signed)
Referring Provider: Babs Sciara, MD Primary Care Physician:  Babs Sciara, MD Primary GI Physician: Rehman  Chief Complaint  Patient presents with   Follow-up    1 year follow up. Skin tag in rectum. Has seen a little blood in stool, has gas, heartburn 2 -3 times in the past several months. 2 -3 stools a day, appetite is good   HPI:   Justin Estrada is a 55 y.o. male with past medical history of anal fissure, gallbladder polyp and cardiac catherization.   Patient presenting today for follow up of anal fissure and epigastric/RUQ pain.   Anal Fissure:  previously treated with diltiazem gel with good result, now using it maybe every 3-4 months. He reports he is not having much issue with pain. He used the gel a few nights ago with relief.   Epigastric/RUQ pain: ongoing, gallbladder polyp, 75mm seen previously, follow up US 11/18/19 showed stable appearance of small gallbladder polyp, largest measuring 7.6 mm, no evidence of acute cholecystitis. He states that pain occurs very infrequently and has been doing well in this regard for the past few months.   He reports that he occasionally has some issues with reflux maybe 2-3x/month, does not have to take anything for it as it typically resolves on its own. He reports that he takes metamucil and stool softener nightly with good result, denies much issue with constipation, denies melena, hematochezia, dysphagia, denies unintentional weight loss, denies appetite issues.  He also has an anal tag that his PCP looked at, states it does not bother him but he felt it back there and just wanted to make sure it was nothing to worry about which his PCP advised we do not need to do any treatment unless it begins to bother him.   Last Colonoscopy:June 2018, internal hemorrhoids and anal tags Last Endoscopy:Aug 2019 for chest/epigastric pain, prepyloric scar and gastritis, biopsy neg for H pylori.   Recommendations:  Repeat colonoscopy in 2028  Past  Medical History:  Diagnosis Date   Anal fissure    History of cardiac catheterization    Normal coronary arteries January 2021    Past Surgical History:  Procedure Laterality Date   BIOPSY  10/12/2017   Procedure: BIOPSY;  Surgeon: Malissa Hippo, MD;  [facility]: [facility];  Service: Endoscopy;;  antral biopsy    COLONOSCOPY     10 plus years ago by Dr.Rourk   COLONOSCOPY N/A 07/28/2016   Procedure: COLONOSCOPY;  Surgeon: Malissa Hippo, MD;  Location: [facility];  Service: Endoscopy;  Laterality: N/A;  730-rescheduled to 6/4 same time per Ann   ESOPHAGOGASTRODUODENOSCOPY N/A 10/12/2017   Procedure: ESOPHAGOGASTRODUODENOSCOPY (EGD);  Surgeon: Malissa Hippo, MD;  Location: [facility];  Service: Endoscopy;  Laterality: N/A;  730   EXCISION OF SKIN TAG N/A 09/30/2017   Procedure: EXCISION OF PERIANAL SKIN TAGS;  Surgeon: Lucretia Roers, MD;  Location: [facility];  Service: General;  Laterality: N/A;   foot surgery  for bone spurs Bilateral    LEFT HEART CATH AND CORONARY ANGIOGRAPHY N/A 03/11/2019   Procedure: LEFT HEART CATH AND CORONARY ANGIOGRAPHY;  Surgeon: Lyn Records, MD;  Location: [facility];  Service: Cardiovascular;  Laterality: N/A;   UPPER GASTROINTESTINAL ENDOSCOPY     10 plus years ago by Dr.Rourk    Current Outpatient Medications  Medication Sig Dispense Refill   diltiazem 2 % GEL Apply 1 application topically 2 (two) times daily. Use  as directed. 30 g 0   docusate sodium (COLACE) 100 MG capsule Take 1 capsule (100 mg total) by mouth 2 (two) times daily. (Patient taking differently: Take 100 mg by mouth at bedtime.) 60 capsule 0   FLUoxetine (PROZAC) 10 MG capsule Take 1 capsule (10 mg total) by mouth daily. 90 capsule 1   psyllium (METAMUCIL SMOOTH TEXTURE) 28 % packet Take 1 packet by mouth at bedtime.     No current facility-administered medications for this visit.    Allergies as of 11/01/2020   (No Known Allergies)    Family History   Problem Relation Age of Onset   Hypertension Mother    Lung cancer Mother    Liver cancer Mother    Hypertension Father    Congestive Heart Failure Father    Atrial fibrillation Brother     Social History   Socioeconomic History   Marital status: Married    Spouse name: Jama   Number of children: 2   Years of education: 16   Highest education level: Bachelor's degree (e.g., BA, AB, BS)  Occupational History   Occupation: Magazine features editor: [employer]  Tobacco Use   Smoking status: Never   Smokeless tobacco: Never  Vaping Use   Vaping Use: Never used  Substance and Sexual Activity   Alcohol use: No    Alcohol/week: 0.0 standard drinks   Drug use: No   Sexual activity: Yes    Birth control/protection: None  Other Topics Concern   Not on file  Social History Narrative   Patient is right-handed. He lives with his wife and 2 children in a 2 level home with a  Basement. He drinks two cups of coffee and one 16 oz bottle of soda daily. He walks occasionally.   Social Determinants of Health   Financial Resource Strain: Not on file  Food Insecurity: Not on file  Transportation Needs: Not on file  Physical Activity: Not on file  Stress: Not on file  Social Connections: Not on file   Review of Systems: Gen: Denies fever, chills, anorexia. Denies fatigue, weakness, weight loss.  CV: Denies chest pain, palpitations, syncope, peripheral edema, and claudication. Resp: Denies dyspnea at rest, cough, wheezing, coughing up blood, and pleurisy. GI: Denies vomiting blood, jaundice, and fecal incontinence. Denies dysphagia or odynophagia.+occasional reflux, +occasional RUQ pain Derm: Denies rash, itching, dry skin Psych: Denies depression, anxiety, memory loss, confusion. No homicidal or suicidal ideation.  Heme: Denies bruising, bleeding, and enlarged lymph nodes.  Physical Exam: BP 130/77 (BP Location: Right Arm, Patient Position: Sitting, Cuff Size: Large)   Pulse  72   Temp 98 F (36.7 C) (Oral)   Ht 5\' 11"  (1.803 m)   Wt 241 lb 12.8 oz (109.7 kg)   BMI 33.72 kg/m  General:   Alert and oriented. No distress noted. Pleasant and cooperative.  Head:  Normocephalic and atraumatic. Eyes:  Conjuctiva clear without scleral icterus. Mouth:  Oral mucosa pink and moist. Good dentition. No lesions. Heart: Normal rate and rhythm, s1 and s2 heart sounds present.  Lungs: Clear lung sounds in all lobes. Respirations equal and unlabored. Abdomen:  +BS, soft, non-tender and non-distended. No rebound or guarding. No HSM or masses noted. Derm: No palmar erythema or jaundice Msk:  Symmetrical without gross deformities. Normal posture. Extremities:  Without edema. Neurologic:  Alert and  oriented x4 Psych:  Alert and cooperative. Normal mood and affect.  Invalid input(s): 6 MONTHS   ASSESSMENT: Justin Millspaugh  Estrada is a 55 y.o. male presenting today for follow up of RUQ/epigastric pain and anal fissure.  Patient states he is doing well. RUQ pain now occurs very infrequently. Last [place] was sept 2021, showed stable gallbladder polyp without cholecystitis. He will make [place] aware if he starts to have symptoms more frequently, nausea, or vomiting.   He has occasional reflux, 2-3x/month, reports he does not have to take any medication for it as it resolves on its own. He can take otc pepcid as needed if symptoms become more frequent.   Reports an anal tag that his pcp looked at, states it does not bother him currently but he felt it and just wanted [place] to be aware. Hx of the same on last colonoscopy.  No red flag symptoms. Patient denies melena, hematochezia, nausea, vomiting, diarrhea, constipation, dysphagia, odyonophagia, early satiety or weight loss.   PLAN:  Continue metamucil and stool softener 2.otc pepcid if reflux becomes more frequent 3. Continue diltiazem gel for anal fissure as needed 4. Make [place] aware if RUQ pain becomes worse, you start to have pain after eating,  nausea or vomiting.  Follow Up: 1 year  Keevon Henney L. Jeanmarie Hubert, MSN, APRN, AGNP-C Adult-Gerontology Nurse Practitioner [facility]

## 2020-11-01 NOTE — Patient Instructions (Signed)
Refill sent of diltiazem cream for your fissure If heartburn becomes more prevalent, you can take over the counter pepcid or give [place] a call and we can start you on a different medication if pepcid is not working. We will continue to monitor your gallbladder polyp, please let us know if RUQ pain worsens or you start to have pain with eating, nausea or vomiting.  You will be due to colonoscopy in 2028 unless you have any new issues arise before then.   Follow up 1 year

## 2020-11-06 IMAGING — US US ABDOMEN COMPLETE
1 series · 13 of 25 positions shown · non-contrast
Comparison: Abdominal ultrasound 12/21/2017

CLINICAL DATA: Follow up gallbladder polyp

EXAM:
ABDOMEN ULTRASOUND COMPLETE

[Series 1: us abdomen complete · 13 of 105 slices shown]
[im 1/105]
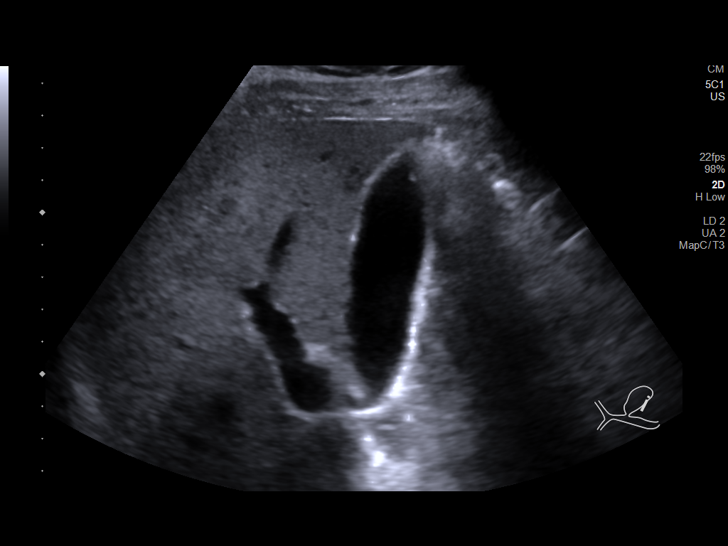
[im 9/105]
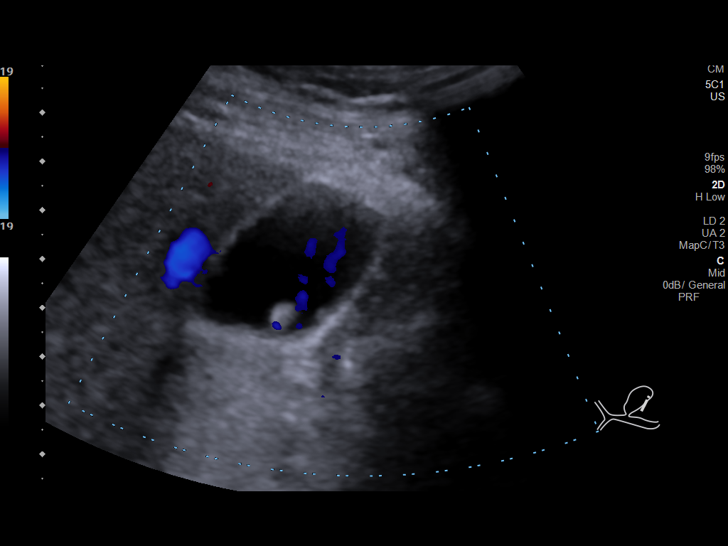
[im 18/105]
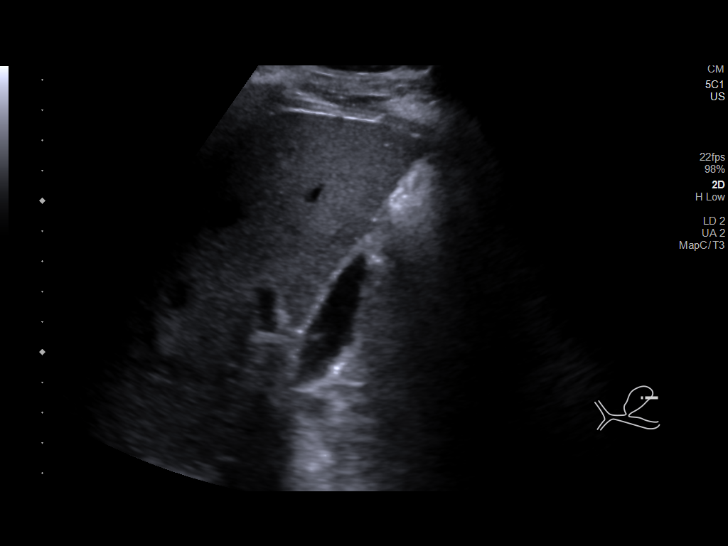
[im 27/105]
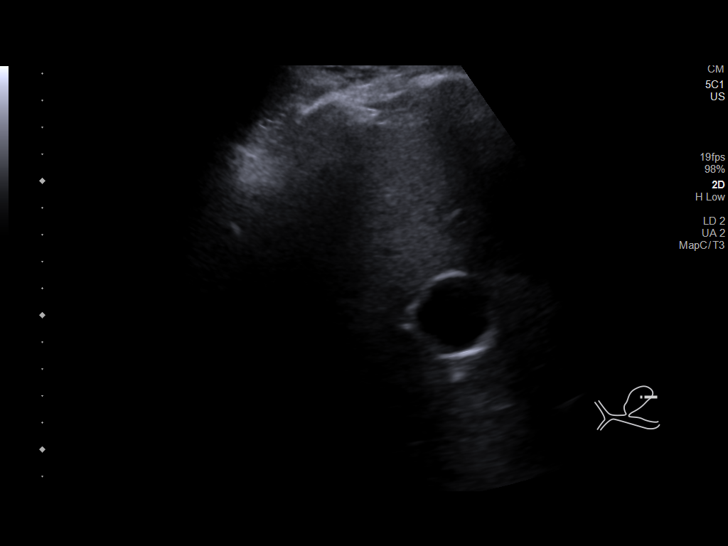
[im 35/105]
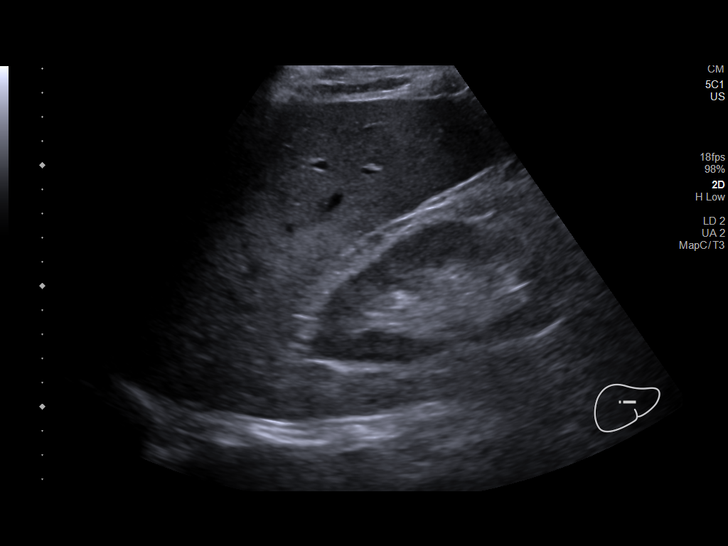
[im 44/105]
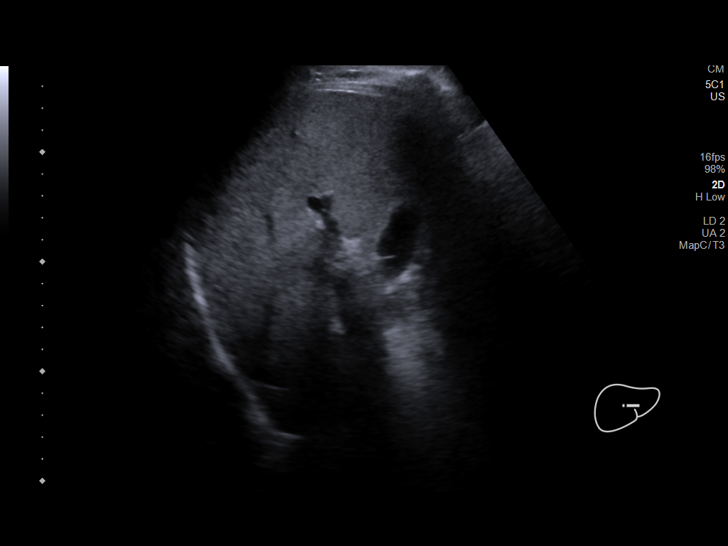
[im 53/105]
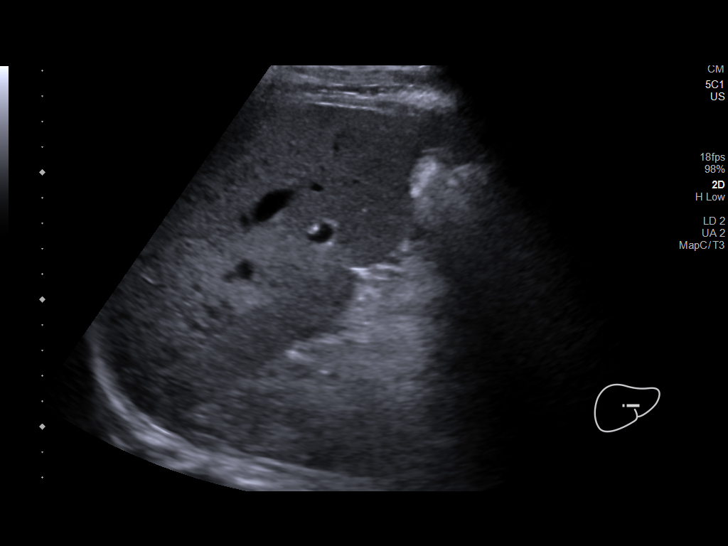
[im 61/105]
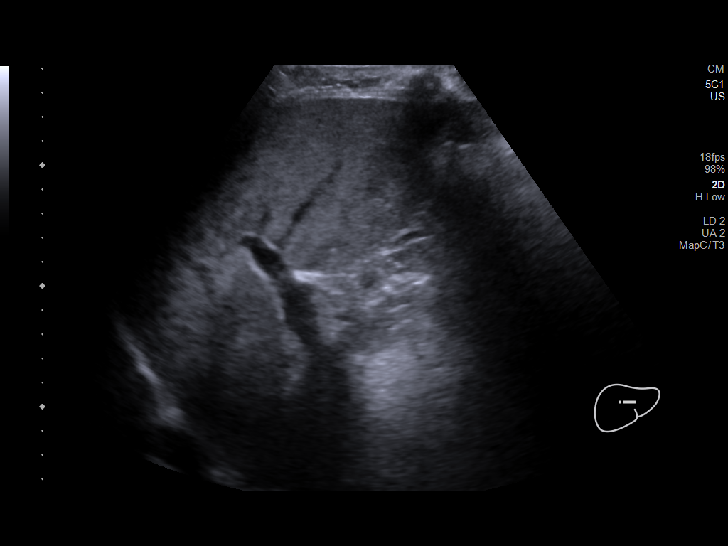
[im 70/105]
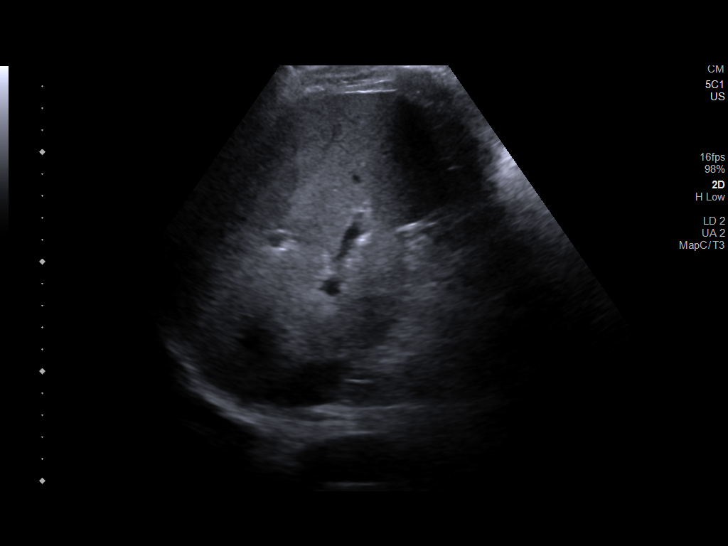
[im 79/105]
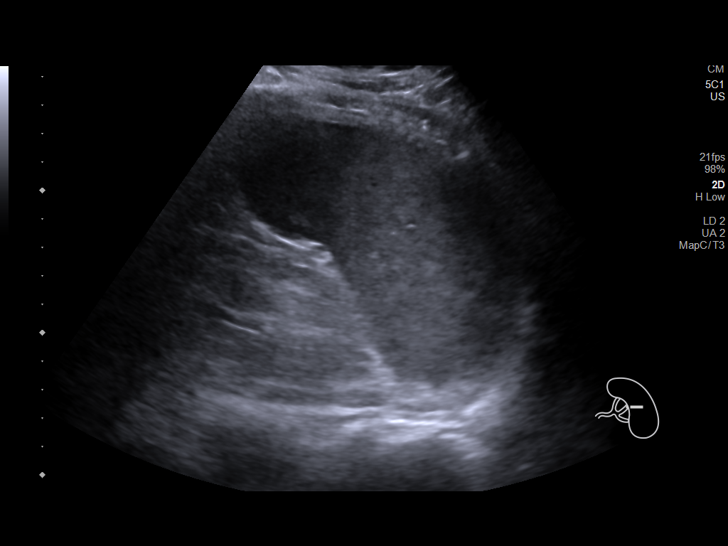
[im 87/105]
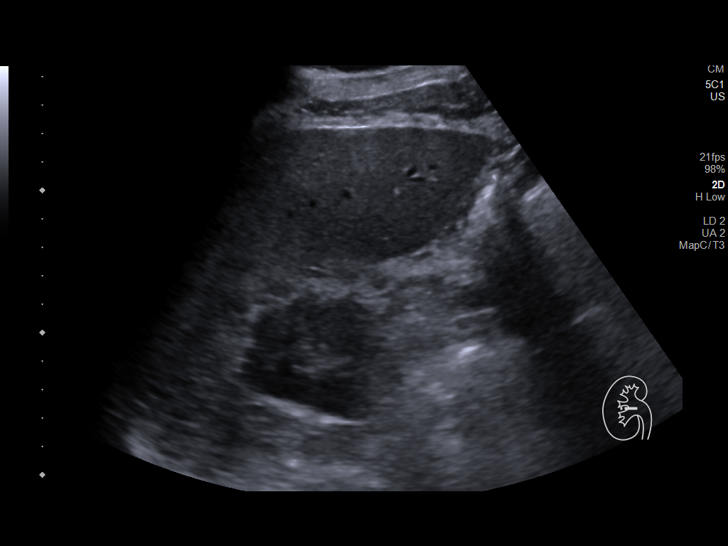
[im 96/105]
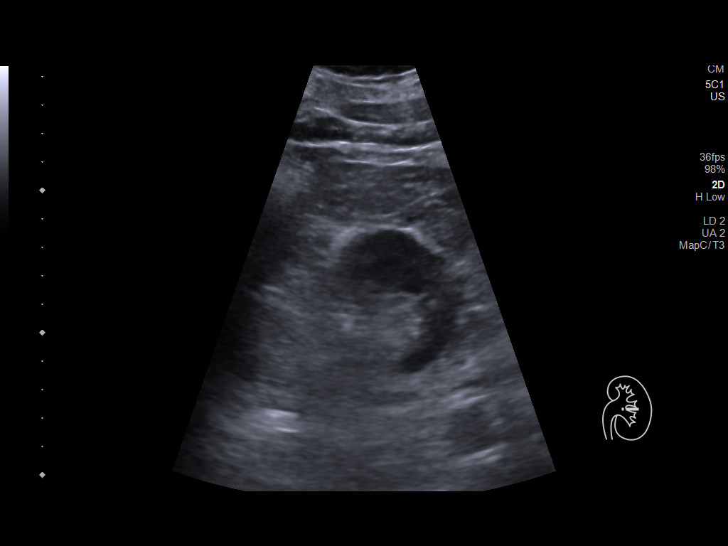
[im 105/105]
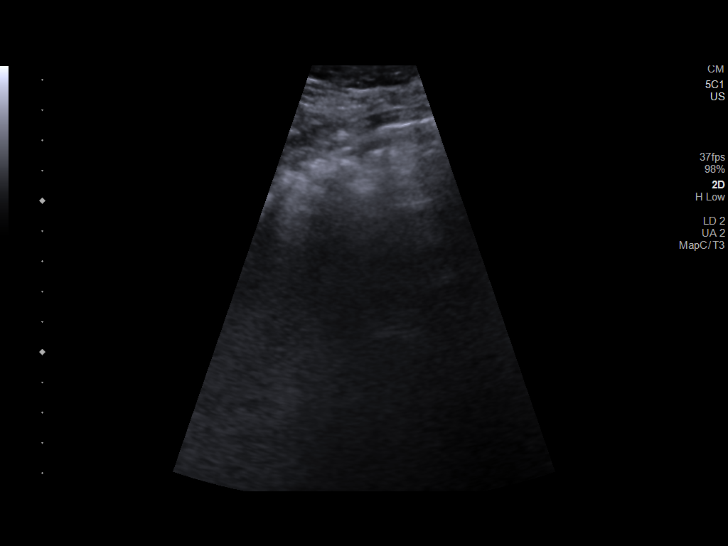

[13 of 25 positions shown; findings below may reference images not displayed]

FINDINGS: Gallbladder: No gallstones or wall thickening visualized. No
sonographic Murphy sign noted by sonographer. Echogenic mural focus
measuring 0.7 cm most likely representing a polyp, previously
measuring 0.8 cm.

Common bile duct: Diameter: 4 mm

Liver: No focal lesion identified. Within normal limits in
parenchymal echogenicity. Portal vein is patent on color Doppler
imaging with normal direction of blood flow towards the liver.

IVC: Poorly visualized.

Pancreas: Poorly visualized due to shadowing bowel gas.

Spleen: Size and appearance within normal limits.

Right Kidney: Length: 12.4 cm. Echogenicity within normal limits. No
mass or hydronephrosis visualized.

Left Kidney: Length: 12.6 cm. Echogenicity within normal limits. No
mass or hydronephrosis visualized.

Abdominal aorta: No aneurysm visualized. The proximal aorta measures
up to 2.8 cm.

Other findings: None.
IMPRESSION: 1. Stable gallbladder polyp measuring 7 mm. Recommend continued
annual surveillance with ultrasound.

2. Ectasia of the proximal abdominal aorta measuring up to 2.8 cm.
Ectatic abdominal aorta at risk for aneurysm development. Recommend
followup by ultrasound in 5 years. This recommendation follows ACR
consensus guidelines: White Paper of the ACR Incidental Findings
Committee II on Vascular Findings. [HOSPITAL] 8959;

## 2020-11-15 ENCOUNTER — Ambulatory Visit (INDEPENDENT_AMBULATORY_CARE_PROVIDER_SITE_OTHER): Payer: BC Managed Care – PPO | Admitting: Gastroenterology

## 2020-12-31 ENCOUNTER — Telehealth: Payer: Self-pay | Admitting: Family Medicine

## 2020-12-31 NOTE — Telephone Encounter (Signed)
Front  Please connect with patient I received a text from him stating that he was sick and would like to be seen  Currently right now I am completely full for today but he could be offered appointments for tomorrow either at the end of the morning or end of the afternoon what ever works best  Certainly if he is having severe issues urgent care or ER

## 2021-01-01 ENCOUNTER — Other Ambulatory Visit: Payer: Self-pay

## 2021-01-01 ENCOUNTER — Ambulatory Visit (INDEPENDENT_AMBULATORY_CARE_PROVIDER_SITE_OTHER): Payer: BC Managed Care – PPO | Admitting: Family Medicine

## 2021-01-01 VITALS — Temp 97.3°F | Wt 243.0 lb

## 2021-01-01 DIAGNOSIS — J019 Acute sinusitis, unspecified: Secondary | ICD-10-CM

## 2021-01-01 DIAGNOSIS — B9689 Other specified bacterial agents as the cause of diseases classified elsewhere: Secondary | ICD-10-CM | POA: Diagnosis not present

## 2021-01-01 MED ORDER — AMOXICILLIN 500 MG PO TABS: 500.0000 mg | ORAL_TABLET | Freq: Three times a day (TID) | ORAL | 0 refills | Status: DC

## 2021-01-01 NOTE — Progress Notes (Signed)
   Subjective:    Patient ID: Justin Estrada, male    DOB: 12-27-1965, 55 y.o.   MRN: [id number]  HPI Pt having sore throat, cough, runny nose, congestion, headache. Dry cough began on Friday. Saturday night began to feel bad and has felt really bad yesterday and today. COVID test at home negative.  4 days of progressive symptoms started off with body aches runny nose cough now with sinus pressure drainage coughing no wheezing Bringing up some discolored phlegm Review of Systems     Objective:   Physical Exam Eardrums normal nares normal throat normal lungs clear heart regular       Assessment & Plan:   Flulike illness Secondary rhinosinusitis Antibiotic prescribed warning signs discussed Stay home from school next few days return to work on Thursday Follow-up sooner problems Notify us if any other issues

## 2021-01-14 ENCOUNTER — Encounter: Payer: Self-pay | Admitting: Family Medicine

## 2021-01-14 MED ORDER — CEFDINIR 300 MG PO CAPS: ORAL_CAPSULE | ORAL | 0 refills | Status: DC

## 2021-01-14 NOTE — Telephone Encounter (Signed)
Omnicef 300 mg 1 twice daily for 7 days follow-up if any ongoing troubles

## 2021-02-13 ENCOUNTER — Ambulatory Visit: Payer: BC Managed Care – PPO | Admitting: Family Medicine

## 2021-02-18 ENCOUNTER — Encounter: Payer: Self-pay | Admitting: Family Medicine

## 2021-02-19 ENCOUNTER — Ambulatory Visit (INDEPENDENT_AMBULATORY_CARE_PROVIDER_SITE_OTHER): Payer: BC Managed Care – PPO | Admitting: Family Medicine

## 2021-02-19 ENCOUNTER — Encounter: Payer: Self-pay | Admitting: Family Medicine

## 2021-02-19 ENCOUNTER — Other Ambulatory Visit: Payer: Self-pay

## 2021-02-19 VITALS — HR 68 | Temp 97.2°F | Ht 71.0 in | Wt 247.0 lb

## 2021-02-19 DIAGNOSIS — R509 Fever, unspecified: Secondary | ICD-10-CM | POA: Diagnosis not present

## 2021-02-19 DIAGNOSIS — R059 Cough, unspecified: Secondary | ICD-10-CM

## 2021-02-19 MED ORDER — HYDROCODONE BIT-HOMATROP MBR 5-1.5 MG/5ML PO SOLN: 5.0000 mL | Freq: Four times a day (QID) | ORAL | 0 refills | Status: DC | PRN

## 2021-02-19 NOTE — Progress Notes (Signed)
° °  Subjective:    Patient ID: Justin Estrada, male    DOB: 04/18/1965, 55 y.o.   MRN: [id number]  HPI Cough / fever x 2 days - mostly dry, coughed up green mucous today Fever last night 100.4, took tylenol  Head congestion drainage coughing denies wheezing difficulty breathing did relate some fever and fatigue  Review of Systems     Objective:   Physical Exam Gen-NAD not toxic TMS-normal bilateral T- normal no redness Chest-CTA respiratory rate normal no crackles CV RRR no murmur Skin-warm dry Neuro-grossly normal        Assessment & Plan:  Viral syndrome Swab taken Await the results Prescription for antibiotics given in case it gets worse Cough medication for home use only caution drowsiness   Healthy diet regular activity follow-up for chronic health issues at a future visit

## 2021-02-20 ENCOUNTER — Encounter: Payer: Self-pay | Admitting: Family Medicine

## 2021-02-20 LAB — SPECIMEN STATUS REPORT

## 2021-02-20 LAB — COVID-19, FLU A+B AND RSV
Influenza A, NAA: NOT DETECTED
Influenza B, NAA: NOT DETECTED
RSV, NAA: NOT DETECTED
SARS-CoV-2, NAA: NOT DETECTED

## 2021-02-21 MED ORDER — AZITHROMYCIN 250 MG PO TABS: ORAL_TABLET | ORAL | 0 refills | Status: DC

## 2021-02-21 MED ORDER — ALBUTEROL SULFATE HFA 108 (90 BASE) MCG/ACT IN AERS: INHALATION_SPRAY | RESPIRATORY_TRACT | 0 refills | Status: DC

## 2021-02-21 NOTE — Telephone Encounter (Signed)
Pt contacted and verbalized understanding. Pt is fine with having inhaler and z pak being sent in. Med sent to [facility] pharmacy with note of secondary bacterial infection.

## 2021-02-21 NOTE — Telephone Encounter (Signed)
Nurses The likelihood of developing pneumonia is low but it could still happen  I would recommend albuterol 2 puffs every 4 hours as needed for wheezing May send in an inhaler  (I also believe that I wrote a prescription for amoxicillin for him?  Please verify that if that is the case I would add azithromycin to this-Z-Pak The combination is the combination that we utilize with potential pneumonia If you send in Z-Pak you will also need to send in a note to pharmacy that the medication is for secondary bacterial infection)

## 2021-02-25 ENCOUNTER — Encounter: Payer: Self-pay | Admitting: Family Medicine

## 2021-03-08 ENCOUNTER — Encounter: Payer: Self-pay | Admitting: Family Medicine

## 2021-03-13 ENCOUNTER — Encounter: Payer: Self-pay | Admitting: Family Medicine

## 2021-03-13 ENCOUNTER — Ambulatory Visit (INDEPENDENT_AMBULATORY_CARE_PROVIDER_SITE_OTHER): Payer: BC Managed Care – PPO | Admitting: Family Medicine

## 2021-03-13 ENCOUNTER — Other Ambulatory Visit: Payer: Self-pay

## 2021-03-13 VITALS — BP 138/88 | HR 70 | Temp 97.5°F | Ht 71.0 in | Wt 250.0 lb

## 2021-03-13 DIAGNOSIS — R0789 Other chest pain: Secondary | ICD-10-CM | POA: Diagnosis not present

## 2021-03-13 DIAGNOSIS — E669 Obesity, unspecified: Secondary | ICD-10-CM

## 2021-03-13 DIAGNOSIS — R059 Cough, unspecified: Secondary | ICD-10-CM

## 2021-03-13 DIAGNOSIS — F411 Generalized anxiety disorder: Secondary | ICD-10-CM

## 2021-03-13 DIAGNOSIS — R519 Headache, unspecified: Secondary | ICD-10-CM

## 2021-03-13 NOTE — Progress Notes (Signed)
° °  Subjective:    Patient ID: Justin Estrada, male    DOB: 1965/03/21, 56 y.o.   MRN: [id number]  HPI 6 month follow up of chronic medical conditions , no concerns voiced  Has seen gi and sports medicine in the summer   Cough-he has had a residual cough ever since being sick several weeks back.  Denies any wheezing or difficulty breathing.  States is more of a postnasal drainage with intermittent coughing no wheezing or difficulty breathing  Left burn LUQ he does have intermittent chest wall pain and is seen several specialist for this.  He was worked up by cardiology which was negative for coronary artery disease he denies chest pressure tightness or shortness of breath with activity.  Left chest upper discomfort-this is a little bit of the musculoskeletal discomfort he is having.  He is seeing physical therapy they are doing dry needling which seems to help him I told him that if he felt like physical therapy would be beneficial we could also refer him for the stretches he is having some right upper back pain and discomfort intermittent and also low back tightness and tenderness.  I did recommend stretches for that Dry needling  5 times Dr Terrilee Files   Anxiety history-he feels that this is under good control denies being depressed.  Continue current medication.  Recent central sinus pain with frontal sinus as she gets this intermittently comes and goes but no drainage with it no discolored drainage no fevers  Neg cardiac workup  Upper and lower back denies sciatica.  Review of Systems     Objective:   Physical Exam General-in no acute distress Eyes-no discharge Lungs-respiratory rate normal, CTA CV-no murmurs,RRR Extremities skin warm dry no edema Neuro grossly normal Behavior normal, alert        Assessment & Plan:   1. Cough, unspecified type See above no need for antibiotic  2. GAD (generalized anxiety disorder) Continue Prozac daily  3. Frontal  headache Intermittent no further work-up necessary currently  4. Musculoskeletal chest pain Negative cardiac work-up in the past see discussion above  5. Obesity (BMI 30.0-34.9) Portion control regular physical activity recommended follow-up for wellness in the summer dated walking healthy diet

## 2021-03-22 NOTE — Progress Notes (Signed)
[facility] [address] [place] [address] Phone: [phone] Subjective:   Justin Estrada, am serving as a scribe for Dr. [name]. This visit occurred during the SARS-CoV-2 public health emergency.  Safety protocols were in place, including screening questions prior to the visit, additional usage of staff PPE, and extensive cleaning of exam room while observing appropriate contact time as indicated for disinfecting solutions.   I'm seeing this patient by the request  of:  Justin Sciara, MD  CC: Continued chest discomfort  [id number]  10/03/2020 Patient has not had anything significant at this time.  Patient still states that it happens intermittently but seems to be more in the armpit.  Patient does have x-rays of the shoulder showing some mild arthritic changes and we discussed the potential for MRI.  Patient agrees that he does not think that this would change medical management at this time.  Feels like it did get better from when it was very severe and has not worsened at this point so patient is able to continue to monitor.  Follow-up with me again as needed  Patient continues to have intermittent pain at this time.  Patient though is able to do all daily activities just fine.  Discussed continuing potential compression and avoid putting pressure in the area.  Patient will be starting school and we will see how he does otherwise.  Follow-up with me again as needed as long as patient does not worsen.  Updated 03/25/2021 Justin Estrada is a 56 y.o. male coming in with complaint of armpit and rib pain. Pain has been sporadic. Last several weeks the side has been growing more irritated. Chest pain is doing better. Tender to palpation in armpit.  Patient states that the chest pain that he was having on the anterior chest wall has improved but continues to have this pain more in the armpit area.  Patient states it can get worse with certain things  such as eating or potentially with exercise.  Sometimes gets better on its own though.  Can even have pain at rest.  Denies any fevers chills.  Has noticed though it did get worse after him having a viral infection.  Patient is just concerned because it is never completely gone away.  Wondering what else could be potentially done for her.  With our work-up patient was found to have more of an ulnar nerve neuropathy that could be contributing as well.       Past Medical History:  Diagnosis Date   Anal fissure    History of cardiac catheterization    Normal coronary arteries January 2021   Past Surgical History:  Procedure Laterality Date   BIOPSY  10/12/2017   Procedure: BIOPSY;  Surgeon: Malissa Hippo, MD;  [facility];  Service: Endoscopy;;  antral biopsy    COLONOSCOPY     10 plus years ago by Dr.Rourk   COLONOSCOPY N/A 07/28/2016   Rehman: anal tags internal hemorrhoids   ESOPHAGOGASTRODUODENOSCOPY N/A 10/12/2017   Rehman chest/epigastric pain, prepyloric scar and gastritis, biopsy neg for H pylori.   EXCISION OF SKIN TAG N/A 09/30/2017   Procedure: EXCISION OF PERIANAL SKIN TAGS;  Surgeon: Lucretia Roers, MD;  Location: [facility];  Service: General;  Laterality: N/A;   foot surgery  for bone spurs Bilateral    LEFT HEART CATH AND CORONARY ANGIOGRAPHY N/A 03/11/2019   Procedure: LEFT HEART CATH AND CORONARY ANGIOGRAPHY;  Surgeon: Katrinka Blazing,  Barry Dienes, MD;  Location: [facility];  Service: Cardiovascular;  Laterality: N/A;   UPPER GASTROINTESTINAL ENDOSCOPY     10 plus years ago by Dr.Rourk   Social History   Socioeconomic History   Marital status: Married    Spouse name: Justin Estrada   Number of children: 2   Years of education: 16   Highest education level: Bachelor's degree (e.g., BA, AB, BS)  Occupational History   Occupation: Magazine features editor: [employer]  Tobacco Use   Smoking status: Never   Smokeless tobacco: Never  Vaping Use   Vaping  Use: Never used  Substance and Sexual Activity   Alcohol use: No    Alcohol/week: 0.0 standard drinks   Drug use: No   Sexual activity: Yes    Birth control/protection: None  Other Topics Concern   Not on file  Social History Narrative   Patient is right-handed. He lives with his wife and 2 children in a 2 level home with a  Basement. He drinks two cups of coffee and one 16 oz bottle of soda daily. He walks occasionally.   Social Determinants of Health   Financial Resource Strain: Not on file  Food Insecurity: Not on file  Transportation Needs: Not on file  Physical Activity: Not on file  Stress: Not on file  Social Connections: Not on file   No Known Allergies Family History  Problem Relation Age of Onset   Hypertension Mother    Lung cancer Mother    Liver cancer Mother    Hypertension Father    Congestive Heart Failure Father    Atrial fibrillation Brother      Current Outpatient Medications (Cardiovascular):    diltiazem 2 % GEL*, Apply 1 application topically 2 (two) times daily. Use as directed.  Current Outpatient Medications (Respiratory):    albuterol (VENTOLIN HFA) 108 (90 Base) MCG/ACT inhaler, Inhale 2 puffs q 4 hrs prn wheezing    Current Outpatient Medications (Other):    diltiazem 2 % GEL*, Apply 1 application topically 2 (two) times daily. Use as directed.   docusate sodium (COLACE) 100 MG capsule, Take 1 capsule (100 mg total) by mouth 2 (two) times daily.   FLUoxetine (PROZAC) 10 MG capsule, Take 1 capsule (10 mg total) by mouth daily.   psyllium (METAMUCIL SMOOTH TEXTURE) 28 % packet, Take 1 packet by mouth at bedtime. * These medications belong to multiple therapeutic classes and are listed under each applicable group.   Reviewed prior external information including notes and imaging from  primary care provider As well as notes that were available from care everywhere and other healthcare systems.  Past medical history, social, surgical and  family history all reviewed in electronic medical record.  No pertanent information unless stated regarding to the chief complaint.   Review of Systems:  No headache, visual changes, nausea, vomiting, diarrhea, constipation, dizziness, abdominal pain, skin rash, fevers, chills, night sweats, weight loss, swollen lymph nodes, body aches, joint swelling, chest pain, shortness of breath, mood changes. POSITIVE muscle aches  Objective  Blood pressure 126/90, pulse 64, height 5\' 11"  (1.803 m), weight 249 lb (112.9 kg), SpO2 95 %.   General: No apparent distress alert and oriented x3 mood and affect normal, dressed appropriately.  HEENT: Pupils equal, extraocular movements intact  Respiratory: Patient's speak in full sentences and does not appear short of breath  Cardiovascular: No lower extremity edema, non tender, no erythema  Gait normal with good balance and  coordination.  MSK: Chest exam does not show any significant findings.  Patient is tender to palpation over the axial Approximately T6-T7 area but no masses appreciated.  Patient does have good range of motion of the shoulder.  Very mild impingement noted.  Neck exam negative Spurling's maneuver noted today.    Impression and Recommendations:     The above documentation has been reviewed and is accurate and complete Justin SaaZachary M Amany Rando, DO

## 2021-03-25 ENCOUNTER — Ambulatory Visit (INDEPENDENT_AMBULATORY_CARE_PROVIDER_SITE_OTHER): Payer: BC Managed Care – PPO | Admitting: Family Medicine

## 2021-03-25 ENCOUNTER — Other Ambulatory Visit: Payer: Self-pay

## 2021-03-25 VITALS — BP 126/90 | HR 64 | Ht 71.0 in | Wt 249.0 lb

## 2021-03-25 DIAGNOSIS — R0782 Intercostal pain: Secondary | ICD-10-CM

## 2021-03-25 DIAGNOSIS — R0789 Other chest pain: Secondary | ICD-10-CM | POA: Diagnosis not present

## 2021-03-25 NOTE — Assessment & Plan Note (Signed)
Patient continues to have the musculoskeletal weakness chest pain.  Continues to be somewhat out of whack.  Patient does have some association with large meals or when he is walking a longer distance.  Patient continues to have this discomfort.  Patient has seen multiple different people.  At this point we did discuss maybe a CT scan for more of a broader picture and the underlying pulmonary disorders could be there as well.  Likely it is more musculoskeletal and will make sure there is not any type of an acute stress reaction.  Differential includes possible proximal radiculopathy from patient's ulnar nerve and may need to consider the possibility of injection if this continues.  Patient has had work-up including GI and cardiac that was unremarkable previously.  Follow-up with me again after imaging to discuss different treatment options.  Total time reviewing patient's chart and discussing with patient 32 minutes

## 2021-03-25 NOTE — Patient Instructions (Addendum)
CT Chest Ordered See you again in 4 weeks

## 2021-03-27 ENCOUNTER — Encounter: Payer: Self-pay | Admitting: Family Medicine

## 2021-04-01 ENCOUNTER — Other Ambulatory Visit: Payer: Self-pay

## 2021-04-01 ENCOUNTER — Ambulatory Visit (INDEPENDENT_AMBULATORY_CARE_PROVIDER_SITE_OTHER)
Admission: RE | Admit: 2021-04-01 | Discharge: 2021-04-01 | Disposition: A | Discharge status: Home / Self Care | Payer: BC Managed Care – PPO | Source: Ambulatory Visit | Attending: Family Medicine | Admitting: Family Medicine

## 2021-04-01 DIAGNOSIS — R0782 Intercostal pain: Secondary | ICD-10-CM

## 2021-04-01 DIAGNOSIS — R0789 Other chest pain: Secondary | ICD-10-CM | POA: Diagnosis not present

## 2021-04-11 ENCOUNTER — Encounter: Payer: Self-pay | Admitting: Family Medicine

## 2021-04-11 ENCOUNTER — Other Ambulatory Visit: Payer: Self-pay | Admitting: Family Medicine

## 2021-04-11 DIAGNOSIS — R059 Cough, unspecified: Secondary | ICD-10-CM

## 2021-04-11 MED ORDER — HYDROCODONE BIT-HOMATROP MBR 5-1.5 MG/5ML PO SOLN: 5.0000 mL | Freq: Four times a day (QID) | ORAL | 0 refills | Status: DC | PRN

## 2021-04-12 ENCOUNTER — Other Ambulatory Visit: Payer: Self-pay

## 2021-04-12 ENCOUNTER — Ambulatory Visit (INDEPENDENT_AMBULATORY_CARE_PROVIDER_SITE_OTHER): Payer: BC Managed Care – PPO | Admitting: Family Medicine

## 2021-04-12 VITALS — HR 84 | Temp 98.1°F | Wt 247.2 lb

## 2021-04-12 DIAGNOSIS — J019 Acute sinusitis, unspecified: Secondary | ICD-10-CM

## 2021-04-12 MED ORDER — AMOXICILLIN-POT CLAVULANATE 875-125 MG PO TABS: 1.0000 | ORAL_TABLET | Freq: Two times a day (BID) | ORAL | 0 refills | Status: DC

## 2021-04-12 NOTE — Progress Notes (Signed)
° °  Subjective:    Patient ID: Justin Estrada, male    DOB: Jul 10, 1965, 56 y.o.   MRN: [id number]  Cough The cough is Productive of sputum. Associated symptoms include nasal congestion and a sore throat. Associated symptoms comments: Head congestion. Patient has been sick since Wednesday night.  Patient presents today with respiratory illness Number of days present-4 days  Symptoms include-head congestion sinus pressure not feeling good  Presence of worrisome signs (severe shortness of breath, lethargy, etc.) -no wheezing or shortness of breath  Recent/current visit to urgent care or ER-denies  Recent direct exposure to Covid-negative  Any current Covid testing-none but he was encouraged to do a home test and call us if positive  Review of Systems  HENT:  Positive for sore throat.   Respiratory:  Positive for cough.       Objective:   Physical Exam Gen-NAD not toxic TMS-normal bilateral T- normal no redness Chest-CTA respiratory rate normal no crackles CV RRR no murmur Skin-warm dry Neuro-grossly normal        Assessment & Plan:  Acute rhinosinusitis Antibiotic prescribed warning signs discussed, follow-up if progressive troubles or problems, recheck if any issues

## 2021-04-15 ENCOUNTER — Ambulatory Visit (INDEPENDENT_AMBULATORY_CARE_PROVIDER_SITE_OTHER): Payer: BC Managed Care – PPO | Admitting: Gastroenterology

## 2021-04-15 ENCOUNTER — Encounter (INDEPENDENT_AMBULATORY_CARE_PROVIDER_SITE_OTHER): Payer: Self-pay | Admitting: Gastroenterology

## 2021-04-15 ENCOUNTER — Other Ambulatory Visit: Payer: Self-pay

## 2021-04-15 VITALS — BP 0/0 | Ht 71.0 in | Wt 242.0 lb

## 2021-04-15 DIAGNOSIS — K5909 Other constipation: Secondary | ICD-10-CM

## 2021-04-15 DIAGNOSIS — K824 Cholesterolosis of gallbladder: Secondary | ICD-10-CM | POA: Diagnosis not present

## 2021-04-15 DIAGNOSIS — K602 Anal fissure, unspecified: Secondary | ICD-10-CM | POA: Diagnosis not present

## 2021-04-15 NOTE — Patient Instructions (Signed)
Schedule liver US Continue Metamucil and stool softeners daily

## 2021-04-15 NOTE — Progress Notes (Signed)
[name], M.D. Gastroenterology & Hepatology [facility] [address] [place], [place] [address] Primary Care Physician: Kathyrn Drown, MD [place] [address]  This is a telephone virtual visit.  It required patient-provider interaction for the medical decision making as documented below. The patient has consented and agreed to proceed with a Telehealth encounter.  VIRTUAL VISIT NOTE Patient location: Home Provider location: office  I will communicate my assessment and recommendations to the referring MD via EMR.  Problems: 1.  Chronic idiopathic constipation 2. History of anal fissure 3. History of gallbladder polyps  History of Present Illness: Justin Estrada is a 56 y.o. male with past medical history of chronic idiopathic constipation complicated by anal fissures and history of gallbladder polyps, who presents for follow up of constipation.  The patient was last seen on 11/01/2020. At that time, the patient was advised to take Metamucil and stool softeners daily, also to take the diltiazem gel as needed for anal fissures.  The patient reports that overall he has felt well.  He has presented frequent gurgling in his abdomen but denies having any nausea, vomiting, fever, chills, hematochezia, melena, hematemesis, abdominal distention, abdominal pain, diarrhea, jaundice, pruritus or weight loss. Takes Metamucil and stool softener and night. He reports that sometimes he has some diarrhea and stops taking these medications - this leads to normalization of her bowel movements but may end up constipated again so he restart these medicines. He describes having occasionally loose Bms, possibly 3 times a day when this happens.  Has not used diltiazem recently.  Last RUQ [place] on 11/17/2020 showed 7.6 mm polyp.  Last Colonoscopy:June 2018, internal hemorrhoids and anal tags Last Endoscopy:Aug 2019 for  chest/epigastric pain, prepyloric scar and gastritis, biopsy neg for H pylori.   Past Medical History: Past Medical History:  Diagnosis Date   Anal fissure    History of cardiac catheterization    Normal coronary arteries January 2021    Past Surgical History: Past Surgical History:  Procedure Laterality Date   BIOPSY  10/12/2017   Procedure: BIOPSY;  Surgeon: Rogene Houston, MD;  [facility]: [facility];  Service: Endoscopy;;  antral biopsy    COLONOSCOPY     10 plus years ago by Dr.Rourk   COLONOSCOPY N/A 07/28/2016   Rehman: anal tags internal hemorrhoids   ESOPHAGOGASTRODUODENOSCOPY N/A 10/12/2017   Rehman chest/epigastric pain, prepyloric scar and gastritis, biopsy neg for H pylori.   EXCISION OF SKIN TAG N/A 09/30/2017   Procedure: EXCISION OF PERIANAL SKIN TAGS;  Surgeon: Virl Cagey, MD;  Location: [facility];  Service: General;  Laterality: N/A;   foot surgery  for bone spurs Bilateral    LEFT HEART CATH AND CORONARY ANGIOGRAPHY N/A 03/11/2019   Procedure: LEFT HEART CATH AND CORONARY ANGIOGRAPHY;  Surgeon: Belva Crome, MD;  Location: [facility];  Service: Cardiovascular;  Laterality: N/A;   UPPER GASTROINTESTINAL ENDOSCOPY     10 plus years ago by Dr.Rourk    Family History: Family History  Problem Relation Age of Onset   Hypertension Mother    Lung cancer Mother    Liver cancer Mother    Hypertension Father    Congestive Heart Failure Father    Atrial fibrillation Brother     Social History: Social History   Tobacco Use  Smoking Status Never  Smokeless Tobacco Never   Social History   Substance and Sexual Activity  Alcohol  Use No   Alcohol/week: 0.0 standard drinks   Social History   Substance and Sexual Activity  Drug Use No    Allergies: No Known Allergies  Medications: Current Outpatient Medications  Medication Sig Dispense Refill   albuterol (VENTOLIN HFA) 108 (90 Base) MCG/ACT inhaler Inhale 2 puffs q 4 hrs prn wheezing  8 g 0   amoxicillin-clavulanate (AUGMENTIN) 875-125 MG tablet Take 1 tablet by mouth 2 (two) times daily. 14 tablet 0   diltiazem 2 % GEL Apply 1 application topically 2 (two) times daily. Use as directed. 30 g 0   docusate sodium (COLACE) 100 MG capsule Take 1 capsule (100 mg total) by mouth 2 (two) times daily. (Patient taking differently: Take 100 mg by mouth daily.) 60 capsule 0   FLUoxetine (PROZAC) 10 MG capsule Take 1 capsule (10 mg total) by mouth daily. 90 capsule 1   HYDROcodone bit-homatropine (HYCODAN) 5-1.5 MG/5ML syrup Take 5 mLs by mouth every 6 (six) hours as needed for cough. 90 mL 0   psyllium (METAMUCIL SMOOTH TEXTURE) 28 % packet Take 1 packet by mouth at bedtime.     No current facility-administered medications for this visit.    Review of Systems: GENERAL: negative for malaise, night sweats HEENT: No changes in hearing or vision, no nose bleeds or other nasal problems. NECK: Negative for lumps, goiter, pain and significant neck swelling RESPIRATORY: Negative for cough, wheezing CARDIOVASCULAR: Negative for chest pain, leg swelling, palpitations, orthopnea GI: SEE HPI MUSCULOSKELETAL: Negative for joint pain or swelling, back pain, and muscle pain. SKIN: Negative for lesions, rash PSYCH: Negative for sleep disturbance, mood disorder and recent psychosocial stressors. HEMATOLOGY Negative for prolonged bleeding, bruising easily, and swollen nodes. ENDOCRINE: Negative for cold or heat intolerance, polyuria, polydipsia and goiter. NEURO: negative for tremor, gait imbalance, syncope and seizures. The remainder of the review of systems is noncontributory.   Physical Exam: No exam was performed as this was a telephone encounter  Imaging/Labs: as above  I personally reviewed and interpreted the available labs, imaging and endoscopic files.  Impression and Plan: Justin Estrada is a 56 y.o. male with past medical history of chronic idiopathic constipation complicated by  anal fissures and history of gallbladder polyps, who presents for follow up of constipation.  Patient has presented significant improvement of his bowel movement frequency after taking Metamucil and stool softeners on a daily basis.  Advised him to continue taking this regimen as this has led to softer bowel movements which are normal and will prevent recurrent fissures.  He was reassured about the normal borborygmi as he has not presented any other associated symptoms.  I will recommend repeating an ultrasound to survey his gallbladder polyps since this has a size more than 5 mm and if increasing in size will need to undergo a cholecystectomy due to the inherent risk of malignancy with larger size polyps.  - Schedule liver US - Continue Metamucil and stool softeners daily  All questions were answered.      Total visit time: I spent a total of 20 minutes  [name], MD Gastroenterology and Hepatology [facility]

## 2021-04-19 ENCOUNTER — Encounter: Payer: Self-pay | Admitting: Family Medicine

## 2021-04-19 ENCOUNTER — Other Ambulatory Visit: Payer: Self-pay | Admitting: Family Medicine

## 2021-04-19 MED ORDER — CEFDINIR 300 MG PO CAPS: ORAL_CAPSULE | ORAL | 0 refills | Status: DC

## 2021-04-19 MED ORDER — AZELASTINE HCL 0.1 % NA SOLN: 2.0000 | Freq: Two times a day (BID) | NASAL | 12 refills | Status: DC

## 2021-04-19 NOTE — Progress Notes (Signed)
[name] [address] [place] [address] Phone: [phone] Subjective:   Justin Estrada, am serving as a scribe for Dr. [name].  This visit occurred during the SARS-CoV-2 public health emergency.  Safety protocols were in place, including screening questions prior to the visit, additional usage of staff PPE, and extensive cleaning of exam room while observing appropriate contact time as indicated for disinfecting solutions.   I'm seeing this patient by the request  of:  Babs Sciara, MD  CC: Chest wall pain follow-up, new elbow pain  [id number]  03/25/2021 Patient continues to have the musculoskeletal weakness chest pain.  Continues to be somewhat out of whack.  Patient does have some association with large meals or when he is walking a longer distance.  Patient continues to have this discomfort.  Patient has seen multiple different people.  At this point we did discuss maybe a CT scan for more of a broader picture and the underlying pulmonary disorders could be there as well.  Likely it is more musculoskeletal and will make sure there is not any type of an acute stress reaction.  Differential includes possible proximal radiculopathy from patient's ulnar nerve and may need to consider the possibility of injection if this continues.  Patient has had work-up including GI and cardiac that was unremarkable previously.  Follow-up with me again after imaging to discuss different treatment options.  Total time reviewing patient's chart and discussing with patient 32 minutes  Updated 04/22/2021 Justin Estrada is a 56 y.o. male coming in with complaint of arm and rib pain. He has been getting dry needling done which is helping the L rib pain. 76  Also c/o L elbow pain. Pain over lateral epicondyle that radiates into medial aspect as well. Pain began 3 weeks ago. Patient is R handed. Pain with reaching to grap and pick up items and after resting arm.    CT unremarkable of the chest.     Past Medical History:  Diagnosis Date   Anal fissure    History of cardiac catheterization    Normal coronary arteries January 2021   Past Surgical History:  Procedure Laterality Date   BIOPSY  10/12/2017   Procedure: BIOPSY;  Surgeon: Malissa Hippo, MD;  [facility];  Service: Endoscopy;;  antral biopsy    COLONOSCOPY     10 plus years ago by Dr.Rourk   COLONOSCOPY N/A 07/28/2016   Rehman: anal tags internal hemorrhoids   ESOPHAGOGASTRODUODENOSCOPY N/A 10/12/2017   Rehman chest/epigastric pain, prepyloric scar and gastritis, biopsy neg for H pylori.   EXCISION OF SKIN TAG N/A 09/30/2017   Procedure: EXCISION OF PERIANAL SKIN TAGS;  Surgeon: Lucretia Roers, MD;  Location: [facility];  Service: General;  Laterality: N/A;   foot surgery  for bone spurs Bilateral    LEFT HEART CATH AND CORONARY ANGIOGRAPHY N/A 03/11/2019   Procedure: LEFT HEART CATH AND CORONARY ANGIOGRAPHY;  Surgeon: Lyn Records, MD;  [facility];  Service: Cardiovascular;  Laterality: N/A;   UPPER GASTROINTESTINAL ENDOSCOPY     10 plus years ago by Dr.Rourk   Social History   Socioeconomic History   Marital status: Married    Spouse name: Jama   Number of children: 2   Years of education: 16   Highest education level: Bachelor's degree (e.g., BA, AB, BS)  Occupational History   Occupation: Magazine features editor: [employer]  Tobacco  Use   Smoking status: Never   Smokeless tobacco: Never  Vaping Use   Vaping Use: Never used  Substance and Sexual Activity   Alcohol use: No    Alcohol/week: 0.0 standard drinks   Drug use: No   Sexual activity: Yes    Birth control/protection: None  Other Topics Concern   Not on file  Social History Narrative   Patient is right-handed. He lives with his wife and 2 children in a 2 level home with a  Basement. He drinks two cups of coffee and one 16 oz bottle of soda daily. He walks  occasionally.   Social Determinants of Health   Financial Resource Strain: Not on file  Food Insecurity: Not on file  Transportation Needs: Not on file  Physical Activity: Not on file  Stress: Not on file  Social Connections: Not on file   No Known Allergies Family History  Problem Relation Age of Onset   Hypertension Mother    Lung cancer Mother    Liver cancer Mother    Hypertension Father    Congestive Heart Failure Father    Atrial fibrillation Brother      Current Outpatient Medications (Cardiovascular):    diltiazem 2 % GEL*, Apply 1 application topically 2 (two) times daily. Use as directed.  Current Outpatient Medications (Respiratory):    albuterol (VENTOLIN HFA) 108 (90 Base) MCG/ACT inhaler, Inhale 2 puffs q 4 hrs prn wheezing   azelastine (ASTELIN) 0.1 % nasal spray, Place 2 sprays into both nostrils 2 (two) times daily.   HYDROcodone bit-homatropine (HYCODAN) 5-1.5 MG/5ML syrup, Take 5 mLs by mouth every 6 (six) hours as needed for cough.    Current Outpatient Medications (Other):    cefdinir (OMNICEF) 300 MG capsule, Take one tablet po twice daily for 7 days   diltiazem 2 % GEL*, Apply 1 application topically 2 (two) times daily. Use as directed.   docusate sodium (COLACE) 100 MG capsule, Take 1 capsule (100 mg total) by mouth 2 (two) times daily. (Patient taking differently: Take 100 mg by mouth daily.)   FLUoxetine (PROZAC) 10 MG capsule, Take 1 capsule (10 mg total) by mouth daily.   psyllium (METAMUCIL SMOOTH TEXTURE) 28 % packet, Take 1 packet by mouth at bedtime. * These medications belong to multiple therapeutic classes and are listed under each applicable group.   Reviewed prior external information including notes and imaging from  primary care provider As well as notes that were available from care everywhere and other healthcare systems.  Past medical history, social, surgical and family history all reviewed in electronic medical record.  No  pertanent information unless stated regarding to the chief complaint.   Review of Systems:  No headache, visual changes, nausea, vomiting, diarrhea, constipation, dizziness, abdominal pain, skin rash, fevers, chills, night sweats, weight loss, swollen lymph nodes, body aches, joint swelling, chest pain, shortness of breath, mood changes. POSITIVE muscle aches  Objective  Blood pressure (!) 140/94, pulse 75, height 5\' 11"  (1.803 m), weight 248 lb (112.5 kg).   General: No apparent distress alert and oriented x3 mood and affect normal, dressed appropriately.  HEENT: Pupils equal, extraocular movements intact  Respiratory: Patient's speak in full sentences and does not appear short of breath  Cardiovascular: No lower extremity edema, non tender, no erythema  Gait normal with good balance and coordination.  MSK: Left elbow exam shows the patient is tender to palpation over the lateral epicondylar area.  Patient does have tenderness to palpation Patient  does have good grip strength noted.  Limited muscular skeletal ultrasound was performed and interpreted by [name], M  Limited ultrasound shows the patient does have a very small cortical irregularity over the lateral epicondylar region that can be consistent with a possible avulsion fracture.  Patient does have increasing in Doppler flow in neovascularization in the most distal aspect of the common extensor tendon.  Seems to be likely some intersubstance tearing noted. Impression: Concern avulsion with possible common extensor tendon tear   Impression and Recommendations:     The above documentation has been reviewed and is accurate and complete Judi Saa, DO

## 2021-04-22 ENCOUNTER — Other Ambulatory Visit: Payer: Self-pay

## 2021-04-22 ENCOUNTER — Ambulatory Visit: Payer: Self-pay

## 2021-04-22 ENCOUNTER — Ambulatory Visit (INDEPENDENT_AMBULATORY_CARE_PROVIDER_SITE_OTHER): Payer: BC Managed Care – PPO

## 2021-04-22 ENCOUNTER — Ambulatory Visit: Payer: BC Managed Care – PPO | Admitting: Family Medicine

## 2021-04-22 ENCOUNTER — Encounter: Payer: Self-pay | Admitting: Family Medicine

## 2021-04-22 VITALS — BP 140/94 | HR 75 | Ht 71.0 in | Wt 248.0 lb

## 2021-04-22 DIAGNOSIS — S56519A Strain of other extensor muscle, fascia and tendon at forearm level, unspecified arm, initial encounter: Secondary | ICD-10-CM | POA: Diagnosis not present

## 2021-04-22 DIAGNOSIS — M25522 Pain in left elbow: Secondary | ICD-10-CM | POA: Diagnosis not present

## 2021-04-22 DIAGNOSIS — R0789 Other chest pain: Secondary | ICD-10-CM | POA: Diagnosis not present

## 2021-04-22 NOTE — Assessment & Plan Note (Signed)
Patient is having the exact opposite of the ulnar neuropathy that patient was having previously.  He now having more on the lateral aspect.  Likely been contributing.  We discussed changing things up and going back to the wrist brace which patient has done before for this as well as the carpal tunnel.  We discussed icing regimen and home exercise, which activity sitting which wants to avoid.  Patient is to increase activity slowly.  Follow-up with me again in 6 to 8 weeks.

## 2021-04-22 NOTE — Patient Instructions (Addendum)
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

## 2021-04-25 ENCOUNTER — Ambulatory Visit (HOSPITAL_COMMUNITY)
Admission: RE | Admit: 2021-04-25 | Discharge: 2021-04-25 | Disposition: A | Discharge status: Home / Self Care | Payer: BC Managed Care – PPO | Source: Ambulatory Visit | Attending: Gastroenterology | Admitting: Gastroenterology

## 2021-04-25 ENCOUNTER — Other Ambulatory Visit: Payer: Self-pay

## 2021-04-25 DIAGNOSIS — K824 Cholesterolosis of gallbladder: Secondary | ICD-10-CM | POA: Diagnosis present

## 2021-05-07 ENCOUNTER — Other Ambulatory Visit: Payer: Self-pay | Admitting: Family Medicine

## 2021-05-07 ENCOUNTER — Encounter: Payer: Self-pay | Admitting: Family Medicine

## 2021-05-07 ENCOUNTER — Other Ambulatory Visit: Payer: Self-pay | Admitting: *Deleted

## 2021-05-07 DIAGNOSIS — Z125 Encounter for screening for malignant neoplasm of prostate: Secondary | ICD-10-CM

## 2021-05-07 DIAGNOSIS — Z79899 Other long term (current) drug therapy: Secondary | ICD-10-CM

## 2021-05-07 DIAGNOSIS — Z0000 Encounter for general adult medical examination without abnormal findings: Secondary | ICD-10-CM

## 2021-05-07 MED ORDER — FLUOXETINE HCL 10 MG PO CAPS: 10.0000 mg | ORAL_CAPSULE | Freq: Every day | ORAL | 1 refills | Status: DC

## 2021-05-07 NOTE — Telephone Encounter (Signed)
Nurses-I sent in his medications that should cover him into the summer ?I recommend metabolic 7, lipid, liver, PSA, CBC by June for wellness exam ?Please let Justin Estrada know that I sent in his medicine ?He should schedule a wellness exam for later in June and do his lab work approximately 1 week ahead of time thanks ?

## 2021-05-30 NOTE — Progress Notes (Signed)
?Terrilee Files D.O. ?[facility] ?[address] [place] [address] ?Phone: [phone] ?Subjective:   ?I, Wilford Grist, am serving as a scribe for Dr. [name]. ? ?This visit occurred during the SARS-CoV-2 public health emergency.  Safety protocols were in place, including screening questions prior to the visit, additional usage of staff PPE, and extensive cleaning of exam room while observing appropriate contact time as indicated for disinfecting solutions.  ? ? ?I'm seeing this patient by the request  of:  Luking, Jonna Coup, MD ? ?CC: Left elbow pain follow-up ? ?[id number]  ?04/22/2021 ?Patient is having the exact opposite of the ulnar neuropathy that patient was having previously.  He now having more on the lateral aspect.  Likely been contributing.  We discussed changing things up and going back to the wrist brace which patient has done before for this as well as the carpal tunnel.  We discussed icing regimen and home exercise, which activity sitting which wants to avoid.  Patient is to increase activity slowly.  Follow-up with me again in 6 to 8 weeks. ? ?Update 06/03/2021 ?Justin Estrada is a 56 y.o. male coming in with complaint of L elbow pain. Patient states that he continues to have intermittent pain. Slight improvement. Painful with lifting heavy items and with overhand lifting.  ? ? ? ?  ? ?Past Medical History:  ?Diagnosis Date  ? Anal fissure   ? History of cardiac catheterization   ? Normal coronary arteries January 2021  ? ?Past Surgical History:  ?Procedure Laterality Date  ? BIOPSY  10/12/2017  ? Procedure: BIOPSY;  Surgeon: Malissa Hippo, MD;  Location: [facility];  Service: Endoscopy;;  antral biopsy   ? COLONOSCOPY    ? 10 plus years ago by Dr.Rourk  ? COLONOSCOPY N/A 07/28/2016  ? Rehman: anal tags internal hemorrhoids  ? ESOPHAGOGASTRODUODENOSCOPY N/A 10/12/2017  ? Rehman chest/epigastric pain, prepyloric scar and gastritis, biopsy neg for H pylori.  ? EXCISION OF  SKIN TAG N/A 09/30/2017  ? Procedure: EXCISION OF PERIANAL SKIN TAGS;  Surgeon: Lucretia Roers, MD;  Location: [facility];  Service: General;  Laterality: N/A;  ? foot surgery  for bone spurs Bilateral   ? LEFT HEART CATH AND CORONARY ANGIOGRAPHY N/A 03/11/2019  ? Procedure: LEFT HEART CATH AND CORONARY ANGIOGRAPHY;  Surgeon: Lyn Records, MD;  Location: [facility] INVASIVE CV LAB;  Service: Cardiovascular;  Laterality: N/A;  ? UPPER GASTROINTESTINAL ENDOSCOPY    ? 10 plus years ago by Dr.Rourk  ? ?Social History  ? ?Socioeconomic History  ? Marital status: Married  ?  Spouse name: Lenox Ahr  ? Number of children: 2  ? Years of education: 69  ? Highest education level: Bachelor's degree (e.g., BA, AB, BS)  ?Occupational History  ? Occupation: Runner, broadcasting/film/video  ?  Employer: [employer]  ?Tobacco Use  ? Smoking status: Never  ? Smokeless tobacco: Never  ?Vaping Use  ? Vaping Use: Never used  ?Substance and Sexual Activity  ? Alcohol use: No  ?  Alcohol/week: 0.0 standard drinks  ? Drug use: No  ? Sexual activity: Yes  ?  Birth control/protection: None  ?Other Topics Concern  ? Not on file  ?Social History Narrative  ? Patient is right-handed. He lives with his wife and 2 children in a 2 level home with a  Basement. He drinks two cups of coffee and one 16 oz bottle of soda daily. He walks occasionally.  ? ?Social Determinants  of Health  ? ?Financial Resource Strain: Not on file  ?Food Insecurity: Not on file  ?Transportation Needs: Not on file  ?Physical Activity: Not on file  ?Stress: Not on file  ?Social Connections: Not on file  ? ?No Known Allergies ?Family History  ?Problem Relation Age of Onset  ? Hypertension Mother   ? Lung cancer Mother   ? Liver cancer Mother   ? Hypertension Father   ? Congestive Heart Failure Father   ? Atrial fibrillation Brother   ? ? ? ?Current Outpatient Medications (Cardiovascular):  ?  diltiazem 2 % GEL*, Apply 1 application topically 2 (two) times daily. Use as directed. ? ?Current Outpatient  Medications (Respiratory):  ?  albuterol (VENTOLIN HFA) 108 (90 Base) MCG/ACT inhaler, Inhale 2 puffs q 4 hrs prn wheezing ?  azelastine (ASTELIN) 0.1 % nasal spray, Place 2 sprays into both nostrils 2 (two) times daily. ?  HYDROcodone bit-homatropine (HYCODAN) 5-1.5 MG/5ML syrup, Take 5 mLs by mouth every 6 (six) hours as needed for cough. ? ? ? ?Current Outpatient Medications (Other):  ?  cefdinir (OMNICEF) 300 MG capsule, Take one tablet po twice daily for 7 days ?  diltiazem 2 % GEL*, Apply 1 application topically 2 (two) times daily. Use as directed. ?  docusate sodium (COLACE) 100 MG capsule, Take 1 capsule (100 mg total) by mouth 2 (two) times daily. (Patient taking differently: Take 100 mg by mouth daily.) ?  FLUoxetine (PROZAC) 10 MG capsule, Take 1 capsule (10 mg total) by mouth daily. ?  psyllium (METAMUCIL SMOOTH TEXTURE) 28 % packet, Take 1 packet by mouth at bedtime. ?* These medications belong to multiple therapeutic classes and are listed under each applicable group. ? ? ?Reviewed prior external information including notes and imaging from  ?primary care provider ?As well as notes that were available from care everywhere and other healthcare systems. ? ?Past medical history, social, surgical and family history all reviewed in electronic medical record.  No pertanent information unless stated regarding to the chief complaint.  ? ?Review of Systems: ? No headache, visual changes, nausea, vomiting, diarrhea, constipation, dizziness, abdominal pain, skin rash, fevers, chills, night sweats, weight loss, swollen lymph nodes, body aches, joint swelling, chest pain, shortness of breath, mood changes. POSITIVE muscle aches ? ?Objective  ?Blood pressure 120/82, pulse 81, height  (1.803 m), weight 250 lb (113.4 kg), SpO2 97 %. ?  ?General: No apparent distress alert and oriented x3 mood and affect normal, dressed appropriately.  ?HEENT: Pupils equal, extraocular movements intact  ?Respiratory: Patient's  speak in full sentences and does not appear short of breath  ?Cardiovascular: No lower extremity edema, non tender, no erythema  ?Gait normal with good balance and coordination.  ?MSK:  left elbow exam shows the patient does have tenderness to palpation more over the lateral epicondylar region.  Worsening pain with wrist extension.  He is doing well and does have a positive Tinel's over the ulnar nerve as well.  Good range of motion of the elbow. ? ?Procedure: Real-time Ultrasound Guided Injection of left common extensor tendon sheath ?Device: GE Logiq Q7 ?Ultrasound guided injection is preferred based studies that show increased duration, increased effect, greater accuracy, decreased procedural pain, increased response rate, and decreased cost with ultrasound guided versus blind injection.  ?Verbal informed consent obtained.  ?Time-out conducted.  ?Noted no overlying erythema, induration, or other signs of local infection.  ?Skin prepped in a sterile fashion.  ?Local anesthesia: Topical Ethyl chloride.  ?With  sterile technique and under real time ultrasound guidance: With a 25-gauge half inch needle injected into the common extensor tendon sheath at the lateral epicondylar region.  A total of 0.5 cc of 0.5% Marcaine and 0.5 cc of Kenalog 40 mg per mill use. ?Completed without difficulty  ?Pain immediately resolved suggesting accurate placement of the medication.  ?Advised to call if fevers/chills, erythema, induration, drainage, or persistent bleeding.  ?Impression: Technically successful ultrasound guided injection. ?  ?Impression and Recommendations:  ?  ?The above documentation has been reviewed and is accurate and complete Judi Saa, DO ? ? ? ?

## 2021-06-03 ENCOUNTER — Ambulatory Visit: Payer: Self-pay

## 2021-06-03 ENCOUNTER — Encounter: Payer: Self-pay | Admitting: Family Medicine

## 2021-06-03 ENCOUNTER — Ambulatory Visit (INDEPENDENT_AMBULATORY_CARE_PROVIDER_SITE_OTHER): Payer: BC Managed Care – PPO | Admitting: Family Medicine

## 2021-06-03 VITALS — BP 120/82 | HR 81 | Ht 71.0 in | Wt 250.0 lb

## 2021-06-03 DIAGNOSIS — S56519A Strain of other extensor muscle, fascia and tendon at forearm level, unspecified arm, initial encounter: Secondary | ICD-10-CM | POA: Diagnosis not present

## 2021-06-03 DIAGNOSIS — M25522 Pain in left elbow: Secondary | ICD-10-CM

## 2021-06-03 NOTE — Patient Instructions (Addendum)
Injected lateral elbow ?Careful with yardwork ?See me in 6-8 weeks ? ?

## 2021-06-03 NOTE — Assessment & Plan Note (Signed)
On exam today patient had vomiting and significant improvement.  Patient given injection today to further evaluate.  Hopefully that this will make some benefit.  Discussed icing regimen and home exercises. 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  Patient wants to see how patient responds with this and will follow-up with me again in 6 to 8 weeks otherwise ?

## 2021-07-15 NOTE — Progress Notes (Unsigned)
[name] [address] [place] [address] Phone: [phone] Subjective:   Bruce Donath, am serving as a scribe for Dr. [name].  This visit occurred during the SARS-CoV-2 public health emergency.  Safety protocols were in place, including screening questions prior to the visit, additional usage of staff PPE, and extensive cleaning of exam room while observing appropriate contact time as indicated for disinfecting solutions.    I'm seeing this patient by the request  of:  Babs Sciara, MD  CC: left elbow pain   [id number]  06/03/2021 On exam today patient had vomiting and significant improvement.  Patient given injection today to further evaluate.  Hopefully that this will make some benefit.  Discussed icing regimen and home exercises. 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  Patient wants to see how patient responds with this and will follow-up with me again in 6 to 8 weeks otherwise  Updated 07/16/2021 Justin Estrada is a 56 y.o. male coming in with complaint of left elbow pain. Pain had improved but he moved some items with his brother and now his elbow pain over lateral aspect has increased. Pain with supination and when picking up heavier items.  Patient was stated maybe 20 to 30% better.      Past Medical History:  Diagnosis Date   Anal fissure    History of cardiac catheterization    Normal coronary arteries January 2021   Past Surgical History:  Procedure Laterality Date   BIOPSY  10/12/2017   Procedure: BIOPSY;  Surgeon: Malissa Hippo, MD;  [facility]: [facility];  Service: Endoscopy;;  antral biopsy    COLONOSCOPY     10 plus years ago by Dr.Rourk   COLONOSCOPY N/A 07/28/2016   Rehman: anal tags internal hemorrhoids   ESOPHAGOGASTRODUODENOSCOPY N/A 10/12/2017    Rehman chest/epigastric pain, prepyloric scar and gastritis, biopsy neg for H pylori.   EXCISION OF SKIN TAG N/A 09/30/2017   Procedure: EXCISION OF PERIANAL SKIN TAGS;  Surgeon: Lucretia Roers, MD;  Location: [facility];  Service: General;  Laterality: N/A;   foot surgery  for bone spurs Bilateral    LEFT HEART CATH AND CORONARY ANGIOGRAPHY N/A 03/11/2019   Procedure: LEFT HEART CATH AND CORONARY ANGIOGRAPHY;  Surgeon: Lyn Records, MD;  Location: [facility]AS[facility];  Service: Cardiovascular;  Laterality: N/A;   UPPER GASTROINTESTINAL ENDOSCOPY     10 plus years ago by Dr.Rourk   Social History   Socioeconomic History   Marital status: Married    Spouse name: Jama   Number of children: 2   Years of education: 16   Highest education level: Bachelor's degree (e.g., BA, AB, BS)  Occupational History   Occupation: Magazine features editor: [employer]  Tobacco Use   Smoking status: Never   Smokeless tobacco: Never  Vaping Use   Vaping Use: Never used  Substance and Sexual Activity   Alcohol use: No    Alcohol/week: 0.0 standard drinks   Drug use: No   Sexual activity: Yes    Birth control/protection: None  Other Topics Concern   Not on file  Social History Narrative   Patient is right-handed. He lives with his wife and 2 children in a 2 level home with a  Basement. He drinks two cups of coffee and  one 16 oz bottle of soda daily. He walks occasionally.   Social Determinants of Health   Financial Resource Strain: Not on file  Food Insecurity: Not on file  Transportation Needs: Not on file  Physical Activity: Not on file  Stress: Not on file  Social Connections: Not on file   No Known Allergies Family History  Problem Relation Age of Onset   Hypertension Mother    Lung cancer Mother    Liver cancer Mother    Hypertension Father    Congestive Heart Failure Father    Atrial fibrillation Brother      Current Outpatient Medications (Cardiovascular):     diltiazem 2 % GEL*, Apply 1 application topically 2 (two) times daily. Use as directed.  Current Outpatient Medications (Respiratory):    albuterol (VENTOLIN HFA) 108 (90 Base) MCG/ACT inhaler, Inhale 2 puffs q 4 hrs prn wheezing   azelastine (ASTELIN) 0.1 % nasal spray, Place 2 sprays into both nostrils 2 (two) times daily.   HYDROcodone bit-homatropine (HYCODAN) 5-1.5 MG/5ML syrup, Take 5 mLs by mouth every 6 (six) hours as needed for cough.    Current Outpatient Medications (Other):    cefdinir (OMNICEF) 300 MG capsule, Take one tablet po twice daily for 7 days   diltiazem 2 % GEL*, Apply 1 application topically 2 (two) times daily. Use as directed.   docusate sodium (COLACE) 100 MG capsule, Take 1 capsule (100 mg total) by mouth 2 (two) times daily. (Patient taking differently: Take 100 mg by mouth daily.)   FLUoxetine (PROZAC) 10 MG capsule, Take 1 capsule (10 mg total) by mouth daily.   psyllium (METAMUCIL SMOOTH TEXTURE) 28 % packet, Take 1 packet by mouth at bedtime. * These medications belong to multiple therapeutic classes and are listed under each applicable group.     Objective  Blood pressure 126/82, pulse 74, height 5\' 11"  (1.803 m), weight 245 lb (111.1 kg), SpO2 97 %.   General: No apparent distress alert and oriented x3 mood and affect normal, dressed appropriately.  HEENT: Pupils equal, extraocular movements intact  Respiratory: Patient's speak in full sentences and does not appear short of breath  Cardiovascular: No lower extremity edema, non tender, no erythema  Gait normal with good balance and coordination.  MSK: Patient's left elbow does have some tenderness to palpation.  Over the lateral epicondylar region.  Some mild pain worse with extension.  Limited muscular skeletal ultrasound was performed and interpreted by , M  Limited ultrasound of the right common extensor tendon still shows some very mild hypoechoic changes but improved.  Patient does  still have some neovascularization noted at the proximal aspect of the common extensor tendon with some possible interstitial tearing noted. Impression: Interval improvement but continued common extensor interstitial tearing    Impression and Recommendations:    The above documentation has been reviewed and is accurate and complete [name], DO

## 2021-07-16 ENCOUNTER — Encounter: Payer: Self-pay | Admitting: Family Medicine

## 2021-07-16 ENCOUNTER — Ambulatory Visit: Payer: Self-pay

## 2021-07-16 ENCOUNTER — Ambulatory Visit (INDEPENDENT_AMBULATORY_CARE_PROVIDER_SITE_OTHER): Payer: BC Managed Care – PPO | Admitting: Family Medicine

## 2021-07-16 VITALS — BP 126/82 | HR 74 | Ht 71.0 in | Wt 245.0 lb

## 2021-07-16 DIAGNOSIS — M25522 Pain in left elbow: Secondary | ICD-10-CM | POA: Diagnosis not present

## 2021-07-16 DIAGNOSIS — S56519A Strain of other extensor muscle, fascia and tendon at forearm level, unspecified arm, initial encounter: Secondary | ICD-10-CM

## 2021-07-16 NOTE — Patient Instructions (Signed)
Good to see you Baby it during the summer Have a f/u in 2 months

## 2021-07-16 NOTE — Assessment & Plan Note (Signed)
Patient shows significant decrease in hypoechoic changes.  Still has some mild changes noted though of the tendon that we need to continue to monitor.  Discussed with patient about which activities to do and which ones to avoid, increase activity slowly.  Follow-up with me again 2 months

## 2021-08-29 LAB — LIPID PANEL
Chol/HDL Ratio: 4.8 ratio (ref 0.0–5.0)
Cholesterol, Total: 184 mg/dL (ref 100–199)
HDL: 38 mg/dL — ABNORMAL LOW (ref >39)
LDL Chol Calc (NIH): 118 mg/dL — ABNORMAL HIGH (ref 0–99)
Triglycerides: 159 mg/dL — ABNORMAL HIGH (ref 0–149)
VLDL Cholesterol Cal: 28 mg/dL (ref 5–40)

## 2021-08-29 LAB — CBC WITH DIFFERENTIAL/PLATELET
Basophils Absolute: 0.1 10*3/uL (ref 0.0–0.2)
Basos: 2 %
EOS (ABSOLUTE): 0.2 10*3/uL (ref 0.0–0.4)
Eos: 3 %
Hematocrit: 47.2 % (ref 37.5–51.0)
Hemoglobin: 15.9 g/dL (ref 13.0–17.7)
Immature Grans (Abs): 0 10*3/uL (ref 0.0–0.1)
Immature Granulocytes: 0 %
Lymphocytes Absolute: 1.4 10*3/uL (ref 0.7–3.1)
Lymphs: 24 %
MCH: 30.6 pg (ref 26.6–33.0)
MCHC: 33.7 g/dL (ref 31.5–35.7)
MCV: 91 fL (ref 79–97)
Monocytes Absolute: 0.6 10*3/uL (ref 0.1–0.9)
Monocytes: 10 %
Neutrophils Absolute: 3.6 10*3/uL (ref 1.4–7.0)
Neutrophils: 61 %
Platelets: 286 10*3/uL (ref 150–450)
RBC: 5.19 x10E6/uL (ref 4.14–5.80)
RDW: 12.8 % (ref 11.6–15.4)
WBC: 5.8 10*3/uL (ref 3.4–10.8)

## 2021-08-29 LAB — BASIC METABOLIC PANEL
BUN/Creatinine Ratio: 12 (ref 9–20)
BUN: 14 mg/dL (ref 6–24)
CO2: 23 mmol/L (ref 20–29)
Calcium: 9.2 mg/dL (ref 8.7–10.2)
Chloride: 105 mmol/L (ref 96–106)
Creatinine, Ser: 1.14 mg/dL (ref 0.76–1.27)
Glucose: 100 mg/dL — ABNORMAL HIGH (ref 70–99)
Potassium: 4.7 mmol/L (ref 3.5–5.2)
Sodium: 142 mmol/L (ref 134–144)
eGFR: 76 mL/min/{1.73_m2} (ref >59)

## 2021-08-29 LAB — HEPATIC FUNCTION PANEL
ALT: 23 IU/L (ref 0–44)
AST: 19 IU/L (ref 0–40)
Albumin: 4.7 g/dL (ref 3.8–4.9)
Alkaline Phosphatase: 87 IU/L (ref 44–121)
Bilirubin Total: 0.9 mg/dL (ref 0.0–1.2)
Bilirubin, Direct: 0.19 mg/dL (ref 0.00–0.40)
Total Protein: 6.5 g/dL (ref 6.0–8.5)

## 2021-08-29 LAB — PSA: Prostate Specific Ag, Serum: 0.4 ng/mL (ref 0.0–4.0)

## 2021-09-03 ENCOUNTER — Ambulatory Visit (INDEPENDENT_AMBULATORY_CARE_PROVIDER_SITE_OTHER): Payer: BC Managed Care – PPO | Admitting: Family Medicine

## 2021-09-03 VITALS — BP 132/85 | HR 71 | Temp 97.2°F | Ht 71.0 in | Wt 247.0 lb

## 2021-09-03 DIAGNOSIS — Z23 Encounter for immunization: Secondary | ICD-10-CM | POA: Diagnosis not present

## 2021-09-03 DIAGNOSIS — Z0000 Encounter for general adult medical examination without abnormal findings: Secondary | ICD-10-CM

## 2021-09-03 DIAGNOSIS — E559 Vitamin D deficiency, unspecified: Secondary | ICD-10-CM

## 2021-09-03 DIAGNOSIS — R7301 Impaired fasting glucose: Secondary | ICD-10-CM | POA: Diagnosis not present

## 2021-09-03 DIAGNOSIS — M792 Neuralgia and neuritis, unspecified: Secondary | ICD-10-CM | POA: Diagnosis not present

## 2021-09-03 NOTE — Patient Instructions (Signed)

## 2021-09-03 NOTE — Progress Notes (Signed)
   Subjective:    Patient ID: Justin Estrada, male    DOB: 1965-03-07, 56 y.o.   MRN: [id number]  HPI  The patient comes in today for a wellness visit. In today for wellness Trying to be physically active Not doing as much walking but trying some Trying to watch diet    A review of their health history was completed.  A review of medications was also completed.  Any needed refills; None  Eating habits: fair, trying to eat better  Falls/  MVA accidents in past few months: No  Regular exercise: tries to walk 2 miles per day  Specialist pt sees on regular basis: No  Preventative health issues were discussed.   Additional concerns: No   Review of Systems     Objective:   Physical Exam General-in no acute distress Eyes-no discharge Lungs-respiratory rate normal, CTA CV-no murmurs,RRR Extremities skin warm dry no edema Neuro grossly normal Behavior normal, alert  The 10-year ASCVD risk score (Arnett DK, et al., 2019) is: 6.8%   Values used to calculate the score:     Age: 63 years     Sex: Male     Is Non-Hispanic African American: No     Diabetic: No     Tobacco smoker: No     Systolic Blood Pressure: 132 mmHg     Is BP treated: No     HDL Cholesterol: 38 mg/dL     Total Cholesterol: 184 mg/dL  Prostate exam normal     Assessment & Plan:  1. Well adult exam Adult wellness-complete.wellness physical was conducted today. Importance of diet and exercise were discussed in detail.  Importance of stress reduction and healthy living were discussed.  In addition to this a discussion regarding safety was also covered.  We also reviewed over immunizations and gave recommendations regarding current immunization needed for age.   In addition to this additional areas were also touched on including: Preventative health exams needed:  Colonoscopy he is up-to-date next 03/15/2026  Patient was advised yearly wellness exam  - Tdap vaccine greater than or equal to 7yo  IM - Vitamin D, 25-hydroxy - Vitamin B12 - Hemoglobin A1C  2. Vitamin D deficiency Check vitamin D level take 400 IUs daily - Vitamin D, 25-hydroxy - Vitamin B12 - Hemoglobin A1C  3. Neuropathic pain Neuropathic pain in his toes do not need nerve conduction study currently check B12 level - Vitamin D, 25-hydroxy - Vitamin B12 - Hemoglobin A1C  4. Fasting hyperglycemia Fasting hyperglycemia and therefore check A1c healthy diet regular activity recommended weight loss recommended - Vitamin D, 25-hydroxy - Vitamin B12 - Hemoglobin A1C  5. Need for vaccination Today - Tdap vaccine greater than or equal to 7yo IM  Hyperlipidemia very important for patient to work hard on diet.  Does not need to be on medications currently.  Patient had a negative cath couple years ago.  If any ongoing troubles may need interventions but currently does not.  Monitor cardiovascular risks on a regular basis  Musculoskeletal pains including left upper chest and left elbow being followed by physical medicine More than likely follow-up 6 months Patient chooses to continue Prozac currently if he feels that he does not need this medication any longer he is to let us know and we could try a trial off of it It was being utilized for stress and anxiety

## 2021-09-07 LAB — VITAMIN B12: Vitamin B-12: 537 pg/mL (ref 232–1245)

## 2021-09-07 LAB — VITAMIN D 25 HYDROXY (VIT D DEFICIENCY, FRACTURES): Vit D, 25-Hydroxy: 24 ng/mL — ABNORMAL LOW (ref 30.0–100.0)

## 2021-09-07 LAB — HEMOGLOBIN A1C
Est. average glucose Bld gHb Est-mCnc: 103 mg/dL
Hgb A1c MFr Bld: 5.2 % (ref 4.8–5.6)

## 2021-09-11 NOTE — Progress Notes (Signed)
[name] [address] [place] [address] Phone: [phone] Subjective:   INadine Counts, am serving as a scribe for Dr. [name].  I'm seeing this patient by the request  of:  Justin Sciara, MD  CC: Left elbow pain follow-up  [id number]  07/16/2021 Patient shows significant decrease in hypoechoic changes.  Still has some mild changes noted though of the tendon that we need to continue to monitor.  Discussed with patient about which activities to do and which ones to avoid, increase activity slowly.  Follow-up with me again 2 months  Update 09/17/2021 Justin Estrada is a 56 y.o. male coming in with complaint of L elbow pain. Patient states doing about the same. Pain is sporadic and the intensity of pain is only a little better. No other complaints.  Patient states that it is still giving him discomfort from time to time.  Sometimes very stiff at night.  Nothing that stops him from activity.  More uncomfortable than anything else.       Past Medical History:  Diagnosis Date   Anal fissure    History of cardiac catheterization    Normal coronary arteries January 2021   Past Surgical History:  Procedure Laterality Date   BIOPSY  10/12/2017   Procedure: BIOPSY;  Surgeon: Justin Hippo, MD;  [facility];  Service: Endoscopy;;  antral biopsy    COLONOSCOPY     10 plus years ago by Dr.Rourk   COLONOSCOPY N/A 07/28/2016   Justin Estrada: anal tags internal hemorrhoids   ESOPHAGOGASTRODUODENOSCOPY N/A 10/12/2017   Justin Estrada chest/epigastric pain, prepyloric scar and gastritis, biopsy neg for H pylori.   EXCISION OF SKIN TAG N/A 09/30/2017   Procedure: EXCISION OF PERIANAL SKIN TAGS;  Surgeon: Justin Roers, MD;  Location: [facility];  Service: General;  Laterality: N/A;   foot surgery  for bone spurs Bilateral    LEFT HEART CATH AND CORONARY ANGIOGRAPHY N/A 03/11/2019   Procedure: LEFT HEART CATH AND CORONARY ANGIOGRAPHY;   Surgeon: Justin Records, MD;  Location: [facility];  Service: Cardiovascular;  Laterality: N/A;   UPPER GASTROINTESTINAL ENDOSCOPY     10 plus years ago by Dr.Rourk   Social History   Socioeconomic History   Marital status: Married    Spouse name: Jama   Number of children: 2   Years of education: 16   Highest education level: Bachelor's degree (e.g., BA, AB, BS)  Occupational History   Occupation: Magazine features editor: [employer]  Tobacco Use   Smoking status: Never   Smokeless tobacco: Never  Vaping Use   Vaping Use: Never used  Substance and Sexual Activity   Alcohol use: No    Alcohol/week: 0.0 standard drinks of alcohol   Drug use: No   Sexual activity: Yes    Birth control/protection: None  Other Topics Concern   Not on file  Social History Narrative   Patient is right-handed. He lives with his wife and 2 children in a 2 level home with a  Basement. He drinks two cups of coffee and one 16 oz bottle of soda daily. He walks occasionally.   Social Determinants of Health   Financial Resource Strain: Not on file  Food Insecurity: Not on file  Transportation Needs: Not on file  Physical Activity: Not on file  Stress: Not on file  Social Connections: Not on file   No Known Allergies Family History  Problem Relation Age of Onset   Hypertension Mother    Lung cancer Mother    Liver cancer Mother    Hypertension Father    Congestive Heart Failure Father    Atrial fibrillation Brother      Current Outpatient Medications (Cardiovascular):    diltiazem 2 % GEL*, Apply 1 application topically 2 (two) times daily. Use as directed. (Patient not taking: Reported on 09/03/2021)  Current Outpatient Medications (Respiratory):    albuterol (VENTOLIN HFA) 108 (90 Base) MCG/ACT inhaler, Inhale 2 puffs q 4 hrs prn wheezing (Patient not taking: Reported on 09/03/2021)   azelastine (ASTELIN) 0.1 % nasal spray, Place 2 sprays into both nostrils 2 (two) times daily.  (Patient not taking: Reported on 09/03/2021)    Current Outpatient Medications (Other):    diltiazem 2 % GEL*, Apply 1 application topically 2 (two) times daily. Use as directed. (Patient not taking: Reported on 09/03/2021)   docusate sodium (COLACE) 100 MG capsule, Take 1 capsule (100 mg total) by mouth 2 (two) times daily. (Patient taking differently: Take 100 mg by mouth daily.)   FLUoxetine (PROZAC) 10 MG capsule, Take 1 capsule (10 mg total) by mouth daily.   psyllium (METAMUCIL SMOOTH TEXTURE) 28 % packet, Take 1 packet by mouth at bedtime. * These medications belong to multiple therapeutic classes and are listed under each applicable group.   Objective  Blood pressure 122/76, pulse 86, height 5\' 11"  (1.803 m), weight 247 lb (112 kg), SpO2 97 %.   General: No apparent distress alert and oriented x3 mood and affect normal, dressed appropriately.  HEENT: Pupils equal, extraocular movements intact  Respiratory: Patient's speak in full sentences and does not appear short of breath  Cardiovascular: No lower extremity edema, non tender, no erythema  Left elbow exam shows tenderness to palpation over the lateral epicondylar region.  Patient does have worsening pain with resisted extension of the wrist or middle finger. No noted at the area.  Full supination and pronation noted.  Limited muscular skeletal ultrasound was performed and interpreted by , M  Limited ultrasound of patient's common extensor tendon sheath seems to have hypoechoic changes still noted approximately 1 cm from the insertion.  There does not appear to be any type of distraction of the tendon.  No increase in neovascularization in the area. Impression: No significant improvement from previous exam.   Impression and Recommendations:      The above documentation has been reviewed and is accurate and complete [name], DO

## 2021-09-17 ENCOUNTER — Encounter: Payer: Self-pay | Admitting: Family Medicine

## 2021-09-17 ENCOUNTER — Ambulatory Visit: Payer: Self-pay

## 2021-09-17 ENCOUNTER — Ambulatory Visit: Payer: BC Managed Care – PPO | Admitting: Family Medicine

## 2021-09-17 VITALS — BP 122/76 | HR 86 | Ht 71.0 in | Wt 247.0 lb

## 2021-09-17 DIAGNOSIS — S56519A Strain of other extensor muscle, fascia and tendon at forearm level, unspecified arm, initial encounter: Secondary | ICD-10-CM | POA: Diagnosis not present

## 2021-09-17 DIAGNOSIS — M25522 Pain in left elbow: Secondary | ICD-10-CM | POA: Diagnosis not present

## 2021-09-17 NOTE — Assessment & Plan Note (Signed)
Patient is making improvement but still has some hypoechoic changes noted in the common extensor tendon sheath.  We discussed the possibility of PRP.  My hope is the patient does relatively well with the home exercises and icing regimen.  Patient will increase activity slowly otherwise.  Follow-up again in 6 to 8 weeks at that point if continuing to have difficulty we did discuss the PRP or another injection.

## 2021-09-17 NOTE — Patient Instructions (Signed)
Making progress but can see why you're still in pain Avoid certain movements Ice at end of day See you again in 2-3 months

## 2021-09-19 ENCOUNTER — Encounter: Payer: Self-pay | Admitting: Family Medicine

## 2021-09-19 DIAGNOSIS — R7309 Other abnormal glucose: Secondary | ICD-10-CM

## 2021-09-22 NOTE — Telephone Encounter (Signed)
Nurses I read over his message There are some options regarding all of this 1 option is seen neurology for nerve conduction studies of the feet Typically nerve conduction studies when the problem only involves the toes are unlikely to reveal an issue.  Another option would be seeing podiatry for their opinion.  Please communicate with [name] and see which option he would like to try thank you  I would also recommend that patient have a A1c to make sure there is no sign of diabetes.  Diabetes is unlikely because his glucose on previous lab work on July 5 was 100 but doing A1c would be wise due to the elevated glucose.

## 2021-09-27 LAB — HEMOGLOBIN A1C
Est. average glucose Bld gHb Est-mCnc: 103 mg/dL
Hgb A1c MFr Bld: 5.2 % (ref 4.8–5.6)

## 2021-10-21 ENCOUNTER — Encounter: Payer: Self-pay | Admitting: Podiatry

## 2021-10-21 ENCOUNTER — Ambulatory Visit: Payer: BC Managed Care – PPO | Admitting: Podiatry

## 2021-10-21 DIAGNOSIS — G629 Polyneuropathy, unspecified: Secondary | ICD-10-CM | POA: Diagnosis not present

## 2021-10-21 MED ORDER — GABAPENTIN 100 MG PO CAPS: 100.0000 mg | ORAL_CAPSULE | Freq: Three times a day (TID) | ORAL | 3 refills | Status: DC

## 2021-10-23 NOTE — Progress Notes (Signed)
Subjective:   Patient ID: [name], male   DOB: 56 y.o.   MRN: [id number]   HPI Patient presents stating he has been getting some burning in his digits and its just been going on for around 6 months he does not remember specific injury and did have previous Raynaud's a year and a half ago   ROS      Objective:  Physical Exam  Neurovascular status is intact mild diminishment sharp dull vibratory with no loss of range of motion or muscle strength     Assessment:  Possibility for low-grade localized neuropathy versus possibility for back issue or other systemic pathology which could be creating the symptoms present     Plan:  H&P reviewed condition and at this point I have recommended trying low-level gabapentin to see if this will help him and hopefully will be able to keep it at this small dose.  I went ahead and placed him on gabapentin 100 mg to start 1 at night if he does well he can add 1 in the morning 1 midday as needed

## 2021-10-31 ENCOUNTER — Encounter: Payer: Self-pay | Admitting: Family Medicine

## 2021-10-31 ENCOUNTER — Ambulatory Visit: Payer: BC Managed Care – PPO | Admitting: Family Medicine

## 2021-10-31 DIAGNOSIS — J069 Acute upper respiratory infection, unspecified: Secondary | ICD-10-CM

## 2021-10-31 MED ORDER — IPRATROPIUM BROMIDE 0.06 % NA SOLN: 2.0000 | Freq: Four times a day (QID) | NASAL | 0 refills | Status: DC | PRN

## 2021-10-31 MED ORDER — PROMETHAZINE-DM 6.25-15 MG/5ML PO SYRP: 5.0000 mL | ORAL_SOLUTION | Freq: Four times a day (QID) | ORAL | 0 refills | Status: DC | PRN

## 2021-10-31 NOTE — Telephone Encounter (Signed)
Pt completed telephone visit with provider

## 2021-10-31 NOTE — Progress Notes (Signed)
Virtual Visit via Video Note  I connected with Justin Estrada on 10/31/21 at 11:00 AM EDT by a video enabled telemedicine application and verified that I am speaking with the correct person using two identifiers.  Location: Patient: Home Provider: Home   I discussed the limitations of evaluation and management by telemedicine and the availability of in person appointments. The patient expressed understanding and agreed to proceed.  History of Present Illness: 56 year old male presents with respiratory symptoms.   Started yesterday.  Reports congestion, cough, sore throat, runny nose, watery eyes. Has had low-grade temp as well (Tmax 100.3).  No fever today.  He is a Engineer, site.  He took a COVID test at home and it was negative.  No relieving factors.  No other reported symptoms.  No other complaints.   Observations/Objective: No apparent distress.  Speaking in full sentences without difficulty.  Assessment and Plan: Viral URI with cough.  Possible COVID-19.  Advised that he should retest in the next few days.  He is excused from work due to illness.  Atrovent nasal spray and promethazine DM for symptomatic treatment.  Follow Up Instructions:    I discussed the assessment and treatment plan with the patient. The patient was provided an opportunity to ask questions and all were answered. The patient agreed with the plan and demonstrated an understanding of the instructions.   The patient was advised to call back or seek an in-person evaluation if the symptoms worsen or if the condition fails to improve as anticipated.  I provided 5 minutes of non-face-to-face time during this encounter.   Tommie Sams, DO

## 2021-12-02 NOTE — Progress Notes (Unsigned)
Justin Estrada Phone: [phone] Subjective:   Justin Estrada, am serving as a scribe for Dr. Hulan Saas.  I'm seeing this patient by the request  of:  Kathyrn Drown, MD  CC: Left elbow pain  FBP:[id number]  09/17/2021 Patient is making improvement but still has some hypoechoic changes noted in the common extensor tendon sheath.  We discussed the possibility of PRP.  My hope is the patient does relatively well with the home exercises and icing regimen.  Patient will increase activity slowly otherwise.  Follow-up again in 6 to 8 weeks at that point if continuing to have difficulty we did discuss the PRP or another injection.  Updated 12/03/2021 Justin Estrada is a 56 y.o. male coming in with complaint of L elbow pain. Painful at night when he is trying to sleep. Painful to lift coffee mug. Does feel some improvement.        Past Medical History:  Diagnosis Date   Anal fissure    History of cardiac catheterization    Normal coronary arteries January 2021   Past Surgical History:  Procedure Laterality Date   BIOPSY  10/12/2017   Procedure: BIOPSY;  Surgeon: Rogene Houston, MD;  [facility];  Service: Endoscopy;;  antral biopsy    COLONOSCOPY     10 plus years ago by Dr.Rourk   COLONOSCOPY N/A 07/28/2016   Rehman: anal tags internal hemorrhoids   ESOPHAGOGASTRODUODENOSCOPY N/A 10/12/2017   Rehman chest/epigastric pain, prepyloric scar and gastritis, biopsy neg for H pylori.   EXCISION OF SKIN TAG N/A 09/30/2017   Procedure: EXCISION OF PERIANAL SKIN TAGS;  Surgeon: Virl Cagey, MD;  Location: [facility];  Service: General;  Laterality: N/A;   foot surgery  for bone spurs Bilateral    LEFT HEART CATH AND CORONARY ANGIOGRAPHY N/A 03/11/2019   Procedure: LEFT HEART CATH AND CORONARY ANGIOGRAPHY;  Surgeon: Belva Crome, MD;  Location: [facility];  Service: Cardiovascular;  Laterality:  N/A;   UPPER GASTROINTESTINAL ENDOSCOPY     10 plus years ago by Dr.Rourk   Social History   Socioeconomic History   Marital status: Married    Spouse name: Jama   Number of children: 2   Years of education: 16   Highest education level: Bachelor's degree (e.g., BA, AB, BS)  Occupational History   Occupation: Product manager: [employer]  Tobacco Use   Smoking status: Never   Smokeless tobacco: Never  Vaping Use   Vaping Use: Never used  Substance and Sexual Activity   Alcohol use: Estrada    Alcohol/week: 0.0 standard drinks of alcohol   Drug use: Estrada   Sexual activity: Yes    Birth control/protection: None  Other Topics Concern   Not on file  Social History Narrative   Patient is right-handed. He lives with his wife and 2 children in a 2 level home with a  Basement. He drinks two cups of coffee and one 16 oz bottle of soda daily. He walks occasionally.   Social Determinants of Health   Financial Resource Strain: Not on file  Food Insecurity: Not on file  Transportation Needs: Not on file  Physical Activity: Not on file  Stress: Not on file  Social Connections: Not on file   Estrada Known Allergies Family History  Problem Relation Age of Onset   Hypertension Mother    Lung cancer Mother  Liver cancer Mother    Hypertension Father    Congestive Heart Failure Father    Atrial fibrillation Brother      Current Outpatient Medications (Cardiovascular):    diltiazem 2 % GEL*, Apply 1 application topically 2 (two) times daily. Use as directed.  Current Outpatient Medications (Respiratory):    albuterol (VENTOLIN HFA) 108 (90 Base) MCG/ACT inhaler, Inhale 2 puffs q 4 hrs prn wheezing (Patient not taking: Reported on 09/03/2021)   azelastine (ASTELIN) 0.1 % nasal spray, Place 2 sprays into both nostrils 2 (two) times daily. (Patient not taking: Reported on 09/03/2021)   ipratropium (ATROVENT) 0.06 % nasal spray, Place 2 sprays into both nostrils 4 (four) times  daily as needed for rhinitis.   promethazine-dextromethorphan (PROMETHAZINE-DM) 6.25-15 MG/5ML syrup, Take 5 mLs by mouth 4 (four) times daily as needed for cough.    Current Outpatient Medications (Other):    diltiazem 2 % GEL*, Apply 1 application topically 2 (two) times daily. Use as directed.   docusate sodium (COLACE) 100 MG capsule, Take 1 capsule (100 mg total) by mouth 2 (two) times daily. (Patient taking differently: Take 100 mg by mouth daily.)   FLUoxetine (PROZAC) 10 MG capsule, Take 1 capsule (10 mg total) by mouth daily.   psyllium (METAMUCIL SMOOTH TEXTURE) 28 % packet, Take 1 packet by mouth at bedtime.   gabapentin (NEURONTIN) 100 MG capsule, Take 1 capsule (100 mg total) by mouth 3 (three) times daily. * These medications belong to multiple therapeutic classes and are listed under each applicable group.   Reviewed prior external information including notes and imaging from  primary care provider As well as notes that were available from care everywhere and other healthcare systems.  Past medical history, social, surgical and family history all reviewed in electronic medical record.  Estrada pertanent information unless stated regarding to the chief complaint.   Review of Systems:  Estrada headache, visual changes, nausea, vomiting, diarrhea, constipation, dizziness, abdominal pain, skin rash, fevers, chills, night sweats, weight loss, swollen lymph nodes, body aches, joint swelling, chest pain, shortness of breath, mood changes. POSITIVE muscle aches  Objective  Blood pressure 110/72, pulse 73, height 5\' 11"  (1.803 m), weight 252 lb (114.3 kg), SpO2 96 %.   General: Estrada apparent distress alert and oriented x3 mood and affect normal, dressed appropriately.  HEENT: Pupils equal, extraocular movements intact  Respiratory: Patient's speak in full sentences and does not appear short of breath  Cardiovascular: Estrada lower extremity edema, non tender, Estrada erythema  Left elbow exam still  moderately tender to palpation over the lateral epicondyle area.  Not as much pain over the ulnar nerve.  Negative Tinel's sign at the moment.  Good grip strength.  Does have worsening pain with resisted extension of the middle finger.   Limited muscular skeletal ultrasound was performed and interpreted by , M  Limited ultrasound shows the patient's tendon seems to be significantly improved at the moment.  Patient still has some mild hypoechoic changes at the tendon. Impression: Interval improvement noted.   Impression and Recommendations:     The above documentation has been reviewed and is accurate and complete Justin Primas, DO

## 2021-12-03 ENCOUNTER — Ambulatory Visit: Payer: Self-pay

## 2021-12-03 ENCOUNTER — Encounter: Payer: Self-pay | Admitting: Family Medicine

## 2021-12-03 ENCOUNTER — Ambulatory Visit: Payer: BC Managed Care – PPO | Admitting: Family Medicine

## 2021-12-03 VITALS — BP 110/72 | HR 73 | Ht 71.0 in | Wt 252.0 lb

## 2021-12-03 DIAGNOSIS — M25522 Pain in left elbow: Secondary | ICD-10-CM

## 2021-12-03 DIAGNOSIS — S56519A Strain of other extensor muscle, fascia and tendon at forearm level, unspecified arm, initial encounter: Secondary | ICD-10-CM | POA: Diagnosis not present

## 2021-12-03 NOTE — Assessment & Plan Note (Signed)
Patient is doing better at this time.  Normal side also seems to have decrease in the hypoechoic changes.  Patient will have a follow-up scheduled in 3 months but can follow-up with me as needed.

## 2021-12-03 NOTE — Patient Instructions (Signed)
Looking much better Be careful See you again in 2 months just in case

## 2021-12-06 ENCOUNTER — Ambulatory Visit: Payer: BC Managed Care – PPO | Admitting: Podiatry

## 2021-12-06 ENCOUNTER — Encounter: Payer: Self-pay | Admitting: Podiatry

## 2021-12-06 DIAGNOSIS — I7300 Raynaud's syndrome without gangrene: Secondary | ICD-10-CM | POA: Diagnosis not present

## 2021-12-06 DIAGNOSIS — G629 Polyneuropathy, unspecified: Secondary | ICD-10-CM | POA: Diagnosis not present

## 2021-12-06 MED ORDER — GABAPENTIN 300 MG PO CAPS: 300.0000 mg | ORAL_CAPSULE | Freq: Three times a day (TID) | ORAL | 3 refills | Status: DC

## 2021-12-06 NOTE — Addendum Note (Signed)
Addended by: Wallene Huh on: 12/06/2021 10:39 AM   Modules accepted: Orders

## 2021-12-06 NOTE — Progress Notes (Signed)
Subjective:   Patient ID: Justin Estrada, male   DOB: 56 y.o.   MRN: [id number]   HPI Patient presents stating he still getting the tingling in his feet especially at night and he has seen a doctor for his back and is going to have physical therapy possible MRI   ROS      Objective:  Physical Exam  Neurovascular unchanged from previous visit with tingling and slight burning of the digits which occurs more at nighttime not as bothersome during the day with no current issues with sleep      Assessment:  Neuropathy-like symptoms that are not intensified but still bothersome with possibility for neck and back issues     Plan:  Spent a great deal of time with him discussing this and treatment options.  At this point he only took the medicine for 2-1/2 to 3 weeks and I want him to double the dose at night and take it consistently for several months.  Patient will be seen back to recheck all questions answered today he will pursue the back and neck and if issues were to arise we may have to consider other treatments including possible Qutenza treatment

## 2021-12-25 ENCOUNTER — Encounter (INDEPENDENT_AMBULATORY_CARE_PROVIDER_SITE_OTHER): Payer: Self-pay | Admitting: Gastroenterology

## 2022-01-15 ENCOUNTER — Ambulatory Visit: Payer: BC Managed Care – PPO | Admitting: Family Medicine

## 2022-01-20 NOTE — Progress Notes (Unsigned)
Cardiology Office Note  Date: 01/21/2022   ID: Justin Estrada, DOB 1965/05/18, MRN [id number]  PCP:  Babs Sciara, MD  Cardiologist:  Nona Dell, MD Electrophysiologist:  None   Chief Complaint  Patient presents with   Cardiac follow-up    History of Present Illness: Justin Estrada is a 56 y.o. male last seen in January 2022.  He is here for a routine follow-up visit.  Reports no angina, NYHA class I dyspnea, no palpitations or syncope.  Still working and [employer].  Prior cardiac testing was reassuring with normal coronary arteries by cardiac catheterization in 2021.  Ultimately, it sounds like his chest discomfort has been more musculoskeletal/neuropathic in etiology.  He has had some back pain and is planned for a lumbar MRI in the near future as well.  10-year estimated CVD risk 6.6%.  We discussed this today.  His LDL was 118 in July of this year.  We discussed dietary measures and exercise, it would not be unreasonable to consider statin therapy if LDL does not come down further.  I personally reviewed his ECG today which shows sinus rhythm with nonspecific ST-T changes.  Past Medical History:  Diagnosis Date   Anal fissure    History of cardiac catheterization    Normal coronary arteries January 2021    Past Surgical History:  Procedure Laterality Date   BIOPSY  10/12/2017   Procedure: BIOPSY;  Surgeon: Malissa Hippo, MD;  [facility];  Service: Endoscopy;;  antral biopsy    COLONOSCOPY     10 plus years ago by Dr.Rourk   COLONOSCOPY N/A 07/28/2016   Rehman: anal tags internal hemorrhoids   ESOPHAGOGASTRODUODENOSCOPY N/A 10/12/2017   Rehman chest/epigastric pain, prepyloric scar and gastritis, biopsy neg for H pylori.   EXCISION OF SKIN TAG N/A 09/30/2017   Procedure: EXCISION OF PERIANAL SKIN TAGS;  Surgeon: Lucretia Roers, MD;  Location: [facility];  Service: General;  Laterality: N/A;   foot surgery  for bone spurs  Bilateral    LEFT HEART CATH AND CORONARY ANGIOGRAPHY N/A 03/11/2019   Procedure: LEFT HEART CATH AND CORONARY ANGIOGRAPHY;  Surgeon: Lyn Records, MD;  [facility];  Service: Cardiovascular;  Laterality: N/A;   UPPER GASTROINTESTINAL ENDOSCOPY     10 plus years ago by Dr.Rourk    Current Outpatient Medications  Medication Sig Dispense Refill   diltiazem 2 % GEL Apply 1 application topically 2 (two) times daily. Use as directed. 30 g 0   docusate sodium (COLACE) 100 MG capsule Take 1 capsule (100 mg total) by mouth 2 (two) times daily. (Patient taking differently: Take 100 mg by mouth daily.) 60 capsule 0   gabapentin (NEURONTIN) 300 MG capsule Take 1 capsule (300 mg total) by mouth 3 (three) times daily. 90 capsule 3   psyllium (METAMUCIL SMOOTH TEXTURE) 28 % packet Take 1 packet by mouth at bedtime.     No current facility-administered medications for this visit.   Allergies:  Patient has no known allergies.   ROS: No orthopnea or PND.  No claudication.  Physical Exam: VS:  BP 130/84   Pulse 68   Ht 5\' 11"  (1.803 m)   Wt 256 lb (116.1 kg)   SpO2 100%   BMI 35.70 kg/m , BMI Body mass index is 35.7 kg/m.  Wt Readings from Last 3 Encounters:  01/21/22 256 lb (116.1 kg)  12/03/21 252 lb (114.3 kg)  09/17/21 247 lb (  112 kg)    General: Patient appears comfortable at rest. HEENT: Conjunctiva and lids normal. Neck: Supple, no elevated JVP or carotid bruits, no thyromegaly. Lungs: Clear to auscultation, nonlabored breathing at rest. Cardiac: Regular rate and rhythm, no S3 or significant systolic murmur, no pericardial rub. Extremities: No pitting edema.  ECG:  An ECG dated 03/21/2020 was personally reviewed today and demonstrated:  Sinus rhythm.  Recent Labwork: 08/28/2021: ALT 23; AST 19; BUN 14; Creatinine, Ser 1.14; Hemoglobin 15.9; Platelets 286; Potassium 4.7; Sodium 142     Component Value Date/Time   CHOL 184 08/28/2021 0823   TRIG 159 (H) 08/28/2021  0823   HDL 38 (L) 08/28/2021 0823   CHOLHDL 4.8 08/28/2021 0823   LDLCALC 118 (H) 08/28/2021 0823    Other Studies Reviewed Today:  Cardiac catheterization 03/11/2019: Left dominant coronary anatomy. Angiographically normal coronary arteries. Normal left ventricular size and function.  EF 60%.  Normal LVEDP.  Assessment and Plan:  History of thoracic pain, most likely musculoskeletal or neuropathic.  Previous cardiac testing was reassuring with normal coronary arteries at cardiac catheterization in 2021.  No substantial atherosclerosis by CT imaging, no personal history of type 2 diabetes mellitus or tobacco use.  His LDL was 118 in July.  Discussed dietary and exercise measures.  Would recheck lipids with PCP next summer.  If LDL has not come down further, it would not be unreasonable to consider statin therapy although his general 10-year risk of CVD is relatively low.  Medication Adjustments/Labs and Tests Ordered: Current medicines are reviewed at length with the patient today.  Concerns regarding medicines are outlined above.   Tests Ordered: Orders Placed This Encounter  Procedures   EKG 12-Lead    Medication Changes: No orders of the defined types were placed in this encounter.   Disposition:  Follow up  1 year.  Signed, Jonelle Sidle, MD, [facility] 01/21/2022 8:33 AM    [facility] at [facility] [address], [place], [place] [address] Phone: [phone]; Fax: [phone]

## 2022-01-21 ENCOUNTER — Ambulatory Visit: Payer: BC Managed Care – PPO | Attending: Cardiology | Admitting: Cardiology

## 2022-01-21 ENCOUNTER — Encounter: Payer: Self-pay | Admitting: Cardiology

## 2022-01-21 VITALS — BP 130/84 | HR 68 | Ht 71.0 in | Wt 256.0 lb

## 2022-01-21 DIAGNOSIS — E785 Hyperlipidemia, unspecified: Secondary | ICD-10-CM | POA: Diagnosis not present

## 2022-01-21 DIAGNOSIS — Z87898 Personal history of other specified conditions: Secondary | ICD-10-CM | POA: Diagnosis not present

## 2022-01-21 NOTE — Patient Instructions (Signed)
Medication Instructions:  Your physician recommends that you continue on your current medications as directed. Please refer to the Current Medication list given to you today.   Labwork: None today  Testing/Procedures: None today  Follow-Up: 1 year  Any Other Special Instructions Will Be Listed Below (If Applicable).  If you need a refill on your cardiac medications before your next appointment, please call your pharmacy.  

## 2022-01-27 ENCOUNTER — Encounter: Payer: Self-pay | Admitting: Podiatry

## 2022-02-05 ENCOUNTER — Ambulatory Visit: Payer: BC Managed Care – PPO | Admitting: Podiatry

## 2022-02-06 ENCOUNTER — Encounter: Payer: Self-pay | Admitting: Family Medicine

## 2022-02-19 ENCOUNTER — Encounter: Payer: Self-pay | Admitting: Podiatry

## 2022-02-19 ENCOUNTER — Ambulatory Visit: Payer: BC Managed Care – PPO | Admitting: Podiatry

## 2022-02-19 DIAGNOSIS — M7671 Peroneal tendinitis, right leg: Secondary | ICD-10-CM

## 2022-02-19 DIAGNOSIS — G629 Polyneuropathy, unspecified: Secondary | ICD-10-CM | POA: Diagnosis not present

## 2022-02-19 MED ORDER — TRIAMCINOLONE ACETONIDE 10 MG/ML IJ SUSP
10.0000 mg | Freq: Once | INTRAMUSCULAR | Status: AC
  Administered 2022-02-19: 10 mg

## 2022-02-19 NOTE — Progress Notes (Signed)
Subjective:   Patient ID: Justin Estrada, male   DOB: 56 y.o.   MRN: [id number]   HPI Patient presents stating he still has the nerve pain is not progressing but still present and states the medicine he is not sure if it has been helpful he does have issues with his back but they are not sure it is related to what he is experiencing.  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  As far as the neuropathy goes I do not recommend treatment but eventually we may have to start him with Qutenza or other treatment course if symptoms were to progress.  Will consider nerve biopsies if symptoms progress next year.  Today I did sterile prep and I injected the outside right foot 3 mg Dexasone Kenalog 5 mg Xylocaine after explaining chances for risk associated with it with sterile dressing applied.  Reappoint as symptoms indicate

## 2022-03-07 ENCOUNTER — Encounter: Payer: Self-pay | Admitting: Family Medicine

## 2022-03-21 ENCOUNTER — Encounter: Payer: Self-pay | Admitting: Podiatry

## 2022-03-29 ENCOUNTER — Encounter: Payer: Self-pay | Admitting: Family Medicine

## 2022-04-15 ENCOUNTER — Encounter (INDEPENDENT_AMBULATORY_CARE_PROVIDER_SITE_OTHER): Payer: Self-pay | Admitting: *Deleted

## 2022-04-23 ENCOUNTER — Telehealth (INDEPENDENT_AMBULATORY_CARE_PROVIDER_SITE_OTHER): Payer: Self-pay | Admitting: *Deleted

## 2022-04-23 DIAGNOSIS — K824 Cholesterolosis of gallbladder: Secondary | ICD-10-CM

## 2022-04-23 NOTE — Telephone Encounter (Signed)
Pt called in. Received recall letter to schedule ultrasound.

## 2022-04-23 NOTE — Telephone Encounter (Signed)
[place] scheduled for 04/25/22 at [facility]. Scheduled for 8 AM, NPO after midnight. Pt contacted and made aware.

## 2022-04-25 ENCOUNTER — Ambulatory Visit: Payer: BC Managed Care – PPO | Admitting: Nurse Practitioner

## 2022-04-25 ENCOUNTER — Ambulatory Visit (HOSPITAL_COMMUNITY)
Admission: RE | Admit: 2022-04-25 | Discharge: 2022-04-25 | Disposition: A | Discharge status: Home / Self Care | Payer: BC Managed Care – PPO | Source: Ambulatory Visit | Attending: Gastroenterology | Admitting: Gastroenterology

## 2022-04-25 VITALS — BP 144/90 | HR 70 | Wt 252.0 lb

## 2022-04-25 DIAGNOSIS — I10 Essential (primary) hypertension: Secondary | ICD-10-CM | POA: Insufficient documentation

## 2022-04-25 DIAGNOSIS — K824 Cholesterolosis of gallbladder: Secondary | ICD-10-CM | POA: Diagnosis present

## 2022-04-25 MED ORDER — LISINOPRIL 5 MG PO TABS: 5.0000 mg | ORAL_TABLET | Freq: Every day | ORAL | 2 refills | Status: DC

## 2022-04-25 NOTE — Progress Notes (Unsigned)
   Subjective:    Patient ID: Justin Estrada, male    DOB: 06/09/1965, 57 y.o.   MRN: [id number]  HPI Patient arrives today concerned with headaches and elevated blood pressures.    Review of Systems     Objective:   Physical Exam        Assessment & Plan:

## 2022-04-25 NOTE — Progress Notes (Unsigned)
Subjective:    Patient ID: Justin Estrada, male    DOB: 05/02/1965, 57 y.o.   MRN: [id number]  HPI Sherren Mocha is being seen today with high blood pressure readings at home and complaints of headaches. Blood pressure readings at home have been reading 0000000 systolic. Has taken it on both arms with the same result. He says the blood pressure cuff is the right size. No recent dietary changes noted. No reports of increased stress levels. He does have a family history of hypertension. He also reports lightheadedness at times. Complains of headaches since mid January. Says that is is more of a pressure headache, and hurts in between his eyebrows. He claims it is sensitive to touch. For three days, he has had headaches that present differently. They start out in the front of his head, and go to the back of his head and down his neck. Headaches are more dull. Has taken Tylenol and Ibuprofen with no relief. Also complains of tiredness with driving. He went to see his eye doctor, and they diagnosed him with dry eyes, and prescribed eye drops. Otherwise he reports his exam as normal. His sleep is good as well. He has been tested twice for sleep apnea, and both tests were negative according to the patient.   Review of Systems  Constitutional:  Negative for chills, fatigue and unexpected weight change.  HENT:  Positive for rhinorrhea, sinus pressure and sinus pain. Negative for ear pain, sneezing and sore throat.        Positive for ear pressure.   Eyes:        Positive for eye dryness.  Respiratory:  Negative for cough, chest tightness, shortness of breath and wheezing.   Cardiovascular:  Negative for chest pain, palpitations and leg swelling.  Neurological:  Positive for light-headedness and headaches. Negative for syncope, facial asymmetry, speech difficulty and weakness.  Psychiatric/Behavioral:  Negative for sleep disturbance.       Objective:   Physical Exam Constitutional:      Appearance: Normal  appearance. He is not ill-appearing.  HENT:     Right Ear: Hearing, ear canal and external ear normal. Tympanic membrane is retracted. Tympanic membrane is not erythematous.     Left Ear: Hearing, ear canal and external ear normal. Tympanic membrane is retracted. Tympanic membrane is not erythematous.     Nose: Congestion and rhinorrhea present.     Mouth/Throat:     Mouth: Mucous membranes are dry.  Cardiovascular:     Rate and Rhythm: Normal rate and regular rhythm.     Pulses: Normal pulses.     Heart sounds: Normal heart sounds.     Comments: No carotid bruits or thrills.  Pulmonary:     Effort: Pulmonary effort is normal. No respiratory distress.     Breath sounds: Normal breath sounds. No wheezing.  Musculoskeletal:     Right lower leg: No edema.     Left lower leg: No edema.  Lymphadenopathy:     Cervical: Cervical adenopathy present.  Skin:    General: Skin is warm and dry.  Neurological:     Mental Status: He is alert.  Psychiatric:        Mood and Affect: Mood normal.        Behavior: Behavior normal.        Thought Content: Thought content normal.        Judgment: Judgment normal.    Vitals:   04/25/22 1605 04/25/22 1647  BP: (!) 148/96 Marland Kitchen)  144/90  Pulse: 70   Weight: 252 lb (114.3 kg)   SpO2: 98%        Assessment & Plan:  1. Essential hypertension - lisinopril (ZESTRIL) 5 MG tablet; Take 1 tablet (5 mg total) by mouth daily.  Dispense: 30 tablet; Refill: 2  Given written and verbal information on lifestyle factors affecting BP.  -Educated patient on warning signs for elevated BP. Check BP at home at different times and send record through [facility] in 2 weeks.  Follow up with Dr. Nicki Reaper in 3-4 months as planned including routine labs.

## 2022-04-25 NOTE — Patient Instructions (Signed)
Hypertension, Adult Hypertension is another name for high blood pressure. High blood pressure forces your heart to work harder to pump blood. This can cause problems over time. There are two numbers in a blood pressure reading. There is a top number (systolic) over a bottom number (diastolic). It is best to have a blood pressure that is below 120/80. What are the causes? The cause of this condition is not known. Some other conditions can lead to high blood pressure. What increases the risk? Some lifestyle factors can make you more likely to develop high blood pressure: Smoking. Not getting enough exercise or physical activity. Being overweight. Having too much fat, sugar, calories, or salt (sodium) in your diet. Drinking too much alcohol. Other risk factors include: Having any of these conditions: Heart disease. Diabetes. High cholesterol. Kidney disease. Obstructive sleep apnea. Having a family history of high blood pressure and high cholesterol. Age. The risk increases with age. Stress. What are the signs or symptoms? High blood pressure may not cause symptoms. Very high blood pressure (hypertensive crisis) may cause: Headache. Fast or uneven heartbeats (palpitations). Shortness of breath. Nosebleed. Vomiting or feeling like you may vomit (nauseous). Changes in how you see. Very bad chest pain. Feeling dizzy. Seizures. How is this treated? This condition is treated by making healthy lifestyle changes, such as: Eating healthy foods. Exercising more. Drinking less alcohol. Your doctor may prescribe medicine if lifestyle changes do not help enough and if: Your top number is above 130. Your bottom number is above 80. Your personal target blood pressure may vary. Follow these instructions at home: Eating and drinking  If told, follow the DASH eating plan. To follow this plan: Fill one half of your plate at each meal with fruits and vegetables. Fill one fourth of your plate  at each meal with whole grains. Whole grains include whole-wheat pasta, brown rice, and whole-grain bread. Eat or drink low-fat dairy products, such as skim milk or low-fat yogurt. Fill one fourth of your plate at each meal with low-fat (lean) proteins. Low-fat proteins include fish, chicken without skin, eggs, beans, and tofu. Avoid fatty meat, cured and processed meat, or chicken with skin. Avoid pre-made or processed food. Limit the amount of salt in your diet to less than 1,500 mg each day. Do not drink alcohol if: Your doctor tells you not to drink. You are pregnant, may be pregnant, or are planning to become pregnant. If you drink alcohol: Limit how much you have to: 0-1 drink a day for women. 0-2 drinks a day for men. Know how much alcohol is in your drink. In the [place] one drink equals one 12 oz bottle of beer (355 mL), one 5 oz glass of wine (148 mL), or one 1 oz glass of hard liquor (44 mL). Lifestyle  Work with your doctor to stay at a healthy weight or to lose weight. Ask your doctor what the best weight is for you. Get at least 30 minutes of exercise that causes your heart to beat faster (aerobic exercise) most days of the week. This may include walking, swimming, or biking. Get at least 30 minutes of exercise that strengthens your muscles (resistance exercise) at least 3 days a week. This may include lifting weights or doing Pilates. Do not smoke or use any products that contain nicotine or tobacco. If you need help quitting, ask your doctor. Check your blood pressure at home as told by your doctor. Keep all follow-up visits. Medicines Take over-the-counter and prescription medicines   only as told by your doctor. Follow directions carefully. Do not skip doses of blood pressure medicine. The medicine does not work as well if you skip doses. Skipping doses also puts you at risk for problems. Ask your doctor about side effects or reactions to medicines that you should watch  for. Contact a doctor if: You think you are having a reaction to the medicine you are taking. You have headaches that keep coming back. You feel dizzy. You have swelling in your ankles. You have trouble with your vision. Get help right away if: You get a very bad headache. You start to feel mixed up (confused). You feel weak or numb. You feel faint. You have very bad pain in your: Chest. Belly (abdomen). You vomit more than once. You have trouble breathing. These symptoms may be an emergency. Get help right away. Call 911. Do not wait to see if the symptoms will go away. Do not drive yourself to the hospital. Summary Hypertension is another name for high blood pressure. High blood pressure forces your heart to work harder to pump blood. For most people, a normal blood pressure is less than 120/80. Making healthy choices can help lower blood pressure. If your blood pressure does not get lower with healthy choices, you may need to take medicine. This information is not intended to replace advice given to you by your health care provider. Make sure you discuss any questions you have with your health care provider. Document Revised: 11/29/2020 Document Reviewed: 11/29/2020 Elsevier Patient Education  2023 Elsevier Inc.  

## 2022-04-27 ENCOUNTER — Encounter: Payer: Self-pay | Admitting: Nurse Practitioner

## 2022-04-29 ENCOUNTER — Encounter: Payer: Self-pay | Admitting: Family Medicine

## 2022-04-29 MED ORDER — AZELASTINE HCL 0.1 % NA SOLN: NASAL | 2 refills | Status: DC

## 2022-04-29 NOTE — Telephone Encounter (Signed)
Nurses He may try Astelin nasal spray 2 sprays each nostril twice daily May send in 1 nasal spray with 2 refills This help with some of the symptoms Otherwise we can follow him up at his office visit (Unfortunately office slots right at the moment are very booked up so therefore many individuals including Justin Estrada are unable to get a quick appointment regarding this issue we will see him toward the end of the month)  Thanks

## 2022-04-29 NOTE — Telephone Encounter (Signed)
Prescription sent electronically to pharmacy. Patient notified and advised of providers recommendations

## 2022-05-19 ENCOUNTER — Ambulatory Visit: Payer: BC Managed Care – PPO | Admitting: Family Medicine

## 2022-05-19 VITALS — BP 126/70 | HR 72 | Ht 71.0 in | Wt 252.6 lb

## 2022-05-19 DIAGNOSIS — I10 Essential (primary) hypertension: Secondary | ICD-10-CM

## 2022-05-19 DIAGNOSIS — R519 Headache, unspecified: Secondary | ICD-10-CM

## 2022-05-19 MED ORDER — PREGABALIN 50 MG PO CAPS: ORAL_CAPSULE | ORAL | 2 refills | Status: DC

## 2022-05-19 MED ORDER — LISINOPRIL 5 MG PO TABS: 5.0000 mg | ORAL_TABLET | Freq: Every day | ORAL | 4 refills | Status: DC

## 2022-05-19 NOTE — Progress Notes (Signed)
   Subjective:    Patient ID: Justin Estrada, male    DOB: Jul 11, 1965, 57 y.o.   MRN: [id number]  HPI Patient arrives today for follow up visit.  Recently diagnosed with blood pressure issues Taking his medicine not having any side effects Trying to watch his diet trying to fit in some walking trying to be careful portion control to lose weight  Patient also states he has intermittent forehead discomfort in the eyebrow region this is been going on longstanding diabetes had previous testing including MRI and unfortunately nothing has ever shown up but nonetheless he continues to have troubles with it sometimes it lasts a few minutes sometimes we can last most of the day no double vision with it no blurred vision no vomiting  Also has some spells on the bottom of his feet for which cause burning and discomfort he was taking gabapentin but it never really seem to help  Patient would like to discuss blood pressure he brings readings in for review.      Review of Systems     Objective:   Physical Exam General-in no acute distress Eyes-no discharge Lungs-respiratory rate normal, CTA CV-no murmurs,RRR Extremities skin warm dry no edema Neuro grossly normal Behavior normal, alert        Assessment & Plan:  Neuropathy pain in his toes was trying gabapentin really did not seem to help much  Blood pressure good reading with a large cuff I feel that the cuff he has at home must be a small cuff and needs adjusting to a larger cuff he will bring the blood pressure machine on next visit to calibrate with ours  Labs ordered await results  Frontal sinus pain previous MRI did not show a sinus problem going on more than likely this is headache related syndrome could be related to his blood pressure we will try Lyrica to see if it might help 50 mg in the evening after 1 week may increase it to twice daily if it causes any side effects stop the medicine  Patient to send a MyChart message in a  couple weeks time to let us know how this is doing  Follow-up later this summer for wellness

## 2022-05-28 LAB — BASIC METABOLIC PANEL (7)
BUN/Creatinine Ratio: 9 (ref 9–20)
BUN: 10 mg/dL (ref 6–24)
CO2: 21 mmol/L (ref 20–29)
Chloride: 107 mmol/L — ABNORMAL HIGH (ref 96–106)
Creatinine, Ser: 1.17 mg/dL (ref 0.76–1.27)
Glucose: 97 mg/dL (ref 70–99)
Potassium: 4.8 mmol/L (ref 3.5–5.2)
Sodium: 144 mmol/L (ref 134–144)
eGFR: 74 mL/min/{1.73_m2} (ref >59)

## 2022-05-28 LAB — MICROALBUMIN / CREATININE URINE RATIO
Creatinine, Urine: 125.5 mg/dL
Microalb/Creat Ratio: 3 mg/g creat (ref 0–29)
Microalbumin, Urine: 3.2 ug/mL

## 2022-06-10 ENCOUNTER — Ambulatory Visit (INDEPENDENT_AMBULATORY_CARE_PROVIDER_SITE_OTHER): Payer: BC Managed Care – PPO | Admitting: Family Medicine

## 2022-06-10 ENCOUNTER — Ambulatory Visit: Payer: BC Managed Care – PPO | Admitting: Family Medicine

## 2022-06-10 VITALS — BP 132/76 | HR 83 | Temp 96.4°F | Ht 71.0 in | Wt 255.0 lb

## 2022-06-10 DIAGNOSIS — B9789 Other viral agents as the cause of diseases classified elsewhere: Secondary | ICD-10-CM | POA: Diagnosis not present

## 2022-06-10 DIAGNOSIS — J988 Other specified respiratory disorders: Secondary | ICD-10-CM | POA: Diagnosis not present

## 2022-06-10 MED ORDER — IPRATROPIUM BROMIDE 0.06 % NA SOLN: 2.0000 | Freq: Four times a day (QID) | NASAL | 0 refills | Status: DC | PRN

## 2022-06-10 MED ORDER — PROMETHAZINE-DM 6.25-15 MG/5ML PO SYRP: 5.0000 mL | ORAL_SOLUTION | Freq: Four times a day (QID) | ORAL | 0 refills | Status: DC | PRN

## 2022-06-10 NOTE — Progress Notes (Signed)
Subjective:  Patient ID: Justin Estrada, male    DOB: April 04, 1965  Age: 57 y.o. MRN: [id number]  CC: Chief Complaint  Patient presents with   Nasal Congestion    Sore throat x 2 days   Cough    congestion   Diarrhea    HPI:  57 year old male presents for evaluation of the above.  Patient has been sick since Sunday.  He reports sore throat, runny nose, nasal congestion, cough, diarrhea.  No fever.  No reported sick contacts.  No relieving factors.  No other complaints or concerns at this time.  Patient Active Problem List   Diagnosis Date Noted   Viral respiratory infection 06/10/2022   Essential hypertension 04/25/2022   Partial tear of common extensor tendon of elbow 04/22/2021   Chronic constipation 04/15/2021   Ulnar neuropathy at elbow of left upper extremity 06/07/2020   Sciatica, left side 06/24/2019   Carpal tunnel syndrome of left wrist 06/24/2019   Tension headache 06/24/2019   Abnormal nuclear stress test    Gall bladder polyp 11/11/2018   Pain of left hip joint 04/06/2018   History of rectal fissure    Chronic low back pain 09/09/2012    Social Hx   Social History   Socioeconomic History   Marital status: Married    Spouse name: Jama   Number of children: 2   Years of education: 16   Highest education level: Bachelor's degree (e.g., BA, AB, BS)  Occupational History   Occupation: Magazine features editor: [employer]  Tobacco Use   Smoking status: Never   Smokeless tobacco: Never  Vaping Use   Vaping Use: Never used  Substance and Sexual Activity   Alcohol use: No    Alcohol/week: 0.0 standard drinks of alcohol   Drug use: No   Sexual activity: Yes    Birth control/protection: None  Other Topics Concern   Not on file  Social History Narrative   Patient is right-handed. He lives with his wife and 2 children in a 2 level home with a  Basement. He drinks two cups of coffee and one 16 oz bottle of soda daily. He walks occasionally.   Social  Determinants of Health   Financial Resource Strain: Not on file  Food Insecurity: Not on file  Transportation Needs: Not on file  Physical Activity: Not on file  Stress: Not on file  Social Connections: Not on file    Review of Systems Per HPI  Objective:  BP 132/76   Pulse 83   Temp (!) 96.4 F (35.8 C)   Ht  (1.803 m)   Wt 255 lb (115.7 kg)   SpO2 97%   BMI 35.57 kg/m      06/10/2022    4:04 PM 05/19/2022    3:46 PM 05/19/2022    3:10 PM  BP/Weight  Systolic BP 132 126 146  Diastolic BP 76 70 89  Wt. (Lbs) 255  252.6  BMI 35.57 kg/m2  35.23 kg/m2    Physical Exam Vitals and nursing note reviewed.  Constitutional:      General: He is not in acute distress.    Appearance: Normal appearance.  HENT:     Head: Normocephalic and atraumatic.     Right Ear: Tympanic membrane normal.     Left Ear: Tympanic membrane normal.     Nose: Congestion present.  Cardiovascular:     Rate and Rhythm: Normal rate and regular rhythm.  Pulmonary:  Effort: Pulmonary effort is normal.     Breath sounds: Normal breath sounds. No wheezing or rales.  Neurological:     Mental Status: He is alert.     Lab Results  Component Value Date   WBC 5.8 08/28/2021   HGB 15.9 08/28/2021   HCT 47.2 08/28/2021   PLT 286 08/28/2021   GLUCOSE 97 05/26/2022   CHOL 184 08/28/2021   TRIG 159 (H) 08/28/2021   HDL 38 (L) 08/28/2021   LDLCALC 118 (H) 08/28/2021   ALT 23 08/28/2021   AST 19 08/28/2021   NA 144 05/26/2022   K 4.8 05/26/2022   CL 107 (H) 05/26/2022   CREATININE 1.17 05/26/2022   BUN 10 05/26/2022   CO2 21 05/26/2022   TSH 1.380 02/15/2020   HGBA1C 5.2 09/26/2021     Assessment & Plan:   Problem List Items Addressed This Visit       Respiratory   Viral respiratory infection - Primary    No evidence of bacterial infection at this time.  Treating symptomatically with Atrovent and Promethazine DM.  Awaiting COVID testing.      Relevant Medications    ipratropium (ATROVENT) 0.06 % nasal spray   promethazine-dextromethorphan (PROMETHAZINE-DM) 6.25-15 MG/5ML syrup   Other Relevant Orders   COVID-19, Flu A+B and RSV    Meds ordered this encounter  Medications   ipratropium (ATROVENT) 0.06 % nasal spray    Sig: Place 2 sprays into both nostrils 4 (four) times daily as needed for rhinitis.    Dispense:  15 mL    Refill:  0   promethazine-dextromethorphan (PROMETHAZINE-DM) 6.25-15 MG/5ML syrup    Sig: Take 5 mLs by mouth 4 (four) times daily as needed for cough.    Dispense:  118 mL    Refill:  0    Follow-up:  Return if symptoms worsen or fail to improve.  Everlene Other DO [facility]

## 2022-06-10 NOTE — Patient Instructions (Addendum)
Rest.   Lots of fluids.  Medications as prescribed. Take a daily OTC Antihistamine as well - Zyrtec, Claritin, Allegra, or Xyzal.  If you continue to have symptoms or fail to improve, please let us know.

## 2022-06-10 NOTE — Assessment & Plan Note (Signed)
No evidence of bacterial infection at this time.  Treating symptomatically with Atrovent and Promethazine DM.  Awaiting COVID testing.

## 2022-06-12 LAB — COVID-19, FLU A+B AND RSV
Influenza A, NAA: NOT DETECTED
Influenza B, NAA: NOT DETECTED
RSV, NAA: NOT DETECTED
SARS-CoV-2, NAA: NOT DETECTED

## 2022-06-16 ENCOUNTER — Encounter: Payer: Self-pay | Admitting: Family Medicine

## 2022-06-16 NOTE — Telephone Encounter (Signed)
Nurses I created a telephone message regarding Justin Estrada Please call him directly to get more details regarding his illness  You may also share this message with [name] Also printed below is the current [facility] policy regarding MyChart messages  Once I receive additional information regarding Tenny Craw we can move forward with either calling in some additional medicine or having him to be seen by us-thanks-Dr. Lilyan Punt  Thank you for your message Due to medical record documentation standards and medical record privacy laws and the message that is received that is regarding an individual's unrelated to the MyChart messenger's chart will be transferred over as a phone message under the proper persons chart and sent to the provider. This will be handled as a phone message and staff will get back to you or your family member if age appropriate-more than likely by phone soon (For example-sending a MyChart message about another patient within the family on another family members chart) We ask in the future that  MyChart messages regarding a specific individual is sent on their MyChart account and not on another members MyChart. If for some reason your family member or friend does not have a MyChart we request that any additional questions be directly from the patient or the patient's caretaker via phone and not through somebody else's MyChart. If you need assistance setting up another member's family MyChart please connect with the front office who can assist you or help connect you with the IT department.  This is a medical records policy that must be followed for privacy reasons.[employer], healthcare system and government guidelines) Thank you for understanding and following Sincerely [facility] [place] Sherie Don DNP Jayce Adriana Simas DO Lilyan Punt MD

## 2022-07-01 ENCOUNTER — Ambulatory Visit (INDEPENDENT_AMBULATORY_CARE_PROVIDER_SITE_OTHER): Payer: BC Managed Care – PPO | Admitting: Family Medicine

## 2022-07-01 VITALS — BP 121/85 | HR 68 | Ht 71.0 in | Wt 253.4 lb

## 2022-07-01 DIAGNOSIS — I10 Essential (primary) hypertension: Secondary | ICD-10-CM | POA: Diagnosis not present

## 2022-07-01 NOTE — Progress Notes (Signed)
   Subjective:    Patient ID: Justin Estrada, male    DOB: May 11, 1965, 57 y.o.   MRN: [id number]  HPI Patient arrives today for follow up on blood pressure. Patient states no concerns or issues today. Patient brings his blood pressure cuff and His readings at home look pretty good His cuff is small for his arm Patient is trying to get back in the habit of exercising walking and trying to bring his weight down His BMI is gone up to 35 He does have HTN Patient for blood pressure check up.  The patient does have hypertension.   Patient relates dietary measures try to minimize salt The importance of healthy diet and activity were discussed Patient relates compliance   Review of Systems     Objective:   Physical Exam General-in no acute distress Eyes-no discharge Lungs-respiratory rate normal, CTA CV-no murmurs,RRR Extremities skin warm dry no edema Neuro grossly normal Behavior normal, alert        Assessment & Plan:   HTN good control continue current measures follow-up in the summer for wellness  HTN- patient seen for follow-up regarding HTN.   Diet, medication compliance, appropriate labs and refills were completed.   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

## 2022-07-18 ENCOUNTER — Encounter: Payer: Self-pay | Admitting: Family Medicine

## 2022-07-18 ENCOUNTER — Other Ambulatory Visit: Payer: Self-pay | Admitting: Family Medicine

## 2022-07-18 MED ORDER — AMOXICILLIN 500 MG PO CAPS: ORAL_CAPSULE | ORAL | 0 refills | Status: DC

## 2022-07-28 ENCOUNTER — Ambulatory Visit (HOSPITAL_COMMUNITY)
Admission: RE | Admit: 2022-07-28 | Discharge: 2022-07-28 | Disposition: A | Discharge status: Home / Self Care | Payer: BC Managed Care – PPO | Source: Ambulatory Visit | Attending: Family Medicine | Admitting: Family Medicine

## 2022-07-28 ENCOUNTER — Ambulatory Visit: Payer: BC Managed Care – PPO | Admitting: Family Medicine

## 2022-07-28 VITALS — BP 138/80 | HR 80 | Ht 71.0 in | Wt 252.2 lb

## 2022-07-28 DIAGNOSIS — R053 Chronic cough: Secondary | ICD-10-CM

## 2022-07-28 MED ORDER — VALSARTAN 40 MG PO TABS: 40.0000 mg | ORAL_TABLET | Freq: Every day | ORAL | 3 refills | Status: DC

## 2022-07-28 NOTE — Progress Notes (Signed)
   Subjective:    Patient ID: Justin Estrada, male    DOB: 1965/08/11, 57 y.o.   MRN: [id number]  HPI Patient arrives with cough and congestion. Cough productive clear and thick.  Patient relates clear phlegm denies high fever chills sweats denies wheezing difficulty breathing no weight loss.  No hemoptysis.  Does not smoke.  Describes it as a nagging cough that will not go away  Review of Systems     Objective:   Physical Exam General-in no acute distress Eyes-no discharge Lungs-respiratory rate normal, CTA CV-no murmurs,RRR Extremities skin warm dry no edema Neuro grossly normal Behavior normal, alert        Assessment & Plan:  Patient is on lisinopril There is a 3-5% chance that this cough could be related to lisinopril Switch lisinopril to valsartan 40 mg daily May utilize allergy medicine as well No antibiotics indicated Chest x-ray ordered Patient to do a blood pressure follow-up later next week

## 2022-07-29 MED ORDER — VALSARTAN 40 MG PO TABS: 40.0000 mg | ORAL_TABLET | Freq: Every day | ORAL | 3 refills | Status: DC

## 2022-07-29 NOTE — Addendum Note (Signed)
Addended by: Lilyan Punt A on: 07/29/2022 05:24 PM   Modules accepted: Orders

## 2022-08-07 ENCOUNTER — Encounter: Payer: Self-pay | Admitting: Family Medicine

## 2022-08-07 ENCOUNTER — Ambulatory Visit: Payer: BC Managed Care – PPO | Admitting: Family Medicine

## 2022-08-07 VITALS — BP 122/72 | HR 74 | Wt 253.0 lb

## 2022-08-07 DIAGNOSIS — Z0000 Encounter for general adult medical examination without abnormal findings: Secondary | ICD-10-CM

## 2022-08-07 DIAGNOSIS — E785 Hyperlipidemia, unspecified: Secondary | ICD-10-CM | POA: Diagnosis not present

## 2022-08-07 DIAGNOSIS — I10 Essential (primary) hypertension: Secondary | ICD-10-CM | POA: Diagnosis not present

## 2022-08-07 DIAGNOSIS — Z79899 Other long term (current) drug therapy: Secondary | ICD-10-CM

## 2022-08-07 DIAGNOSIS — Z125 Encounter for screening for malignant neoplasm of prostate: Secondary | ICD-10-CM | POA: Diagnosis not present

## 2022-08-07 NOTE — Progress Notes (Signed)
   Subjective:    Patient ID: Justin Estrada, male    DOB: 09/27/1965, 57 y.o.   MRN: [id number]  HPI Patient arrives today for BP check.  Here today for follow-up on blood pressure recently medication was changed Patient is trying to fit in some walking He is also trying to think healthier to lose weight with Review of Systems     Objective:   Physical Exam  Lungs clear heart regular pulse normal BP good      Assessment & Plan:  HTN good control on current medication continue current medicines follow-up for wellness in a few weeks lab work before that visit

## 2022-09-02 LAB — HEPATIC FUNCTION PANEL
ALT: 28 IU/L (ref 0–44)
AST: 23 IU/L (ref 0–40)
Albumin: 4.6 g/dL (ref 3.8–4.9)
Alkaline Phosphatase: 89 IU/L (ref 44–121)
Bilirubin Total: 0.8 mg/dL (ref 0.0–1.2)
Bilirubin, Direct: 0.19 mg/dL (ref 0.00–0.40)
Total Protein: 6.9 g/dL (ref 6.0–8.5)

## 2022-09-02 LAB — CBC WITH DIFFERENTIAL/PLATELET
Basophils Absolute: 0.1 10*3/uL (ref 0.0–0.2)
Basos: 1 %
EOS (ABSOLUTE): 0.1 10*3/uL (ref 0.0–0.4)
Eos: 2 %
Hematocrit: 48.7 % (ref 37.5–51.0)
Hemoglobin: 16.6 g/dL (ref 13.0–17.7)
Immature Grans (Abs): 0 10*3/uL (ref 0.0–0.1)
Immature Granulocytes: 1 %
Lymphocytes Absolute: 1.5 10*3/uL (ref 0.7–3.1)
Lymphs: 24 %
MCH: 30.5 pg (ref 26.6–33.0)
MCHC: 34.1 g/dL (ref 31.5–35.7)
MCV: 90 fL (ref 79–97)
Monocytes Absolute: 0.6 10*3/uL (ref 0.1–0.9)
Monocytes: 10 %
Neutrophils Absolute: 3.9 10*3/uL (ref 1.4–7.0)
Neutrophils: 62 %
Platelets: 330 10*3/uL (ref 150–450)
RBC: 5.44 x10E6/uL (ref 4.14–5.80)
RDW: 12.2 % (ref 11.6–15.4)
WBC: 6.3 10*3/uL (ref 3.4–10.8)

## 2022-09-02 LAB — LIPID PANEL
Chol/HDL Ratio: 5 ratio (ref 0.0–5.0)
Cholesterol, Total: 179 mg/dL (ref 100–199)
HDL: 36 mg/dL — ABNORMAL LOW (ref >39)
LDL Chol Calc (NIH): 116 mg/dL — ABNORMAL HIGH (ref 0–99)
Triglycerides: 150 mg/dL — ABNORMAL HIGH (ref 0–149)
VLDL Cholesterol Cal: 27 mg/dL (ref 5–40)

## 2022-09-02 LAB — PSA: Prostate Specific Ag, Serum: 0.4 ng/mL (ref 0.0–4.0)

## 2022-09-05 ENCOUNTER — Ambulatory Visit (INDEPENDENT_AMBULATORY_CARE_PROVIDER_SITE_OTHER): Payer: BC Managed Care – PPO | Admitting: Family Medicine

## 2022-09-05 VITALS — BP 122/84 | HR 76 | Ht 71.0 in | Wt 253.2 lb

## 2022-09-05 DIAGNOSIS — E785 Hyperlipidemia, unspecified: Secondary | ICD-10-CM

## 2022-09-05 DIAGNOSIS — Z0001 Encounter for general adult medical examination with abnormal findings: Secondary | ICD-10-CM

## 2022-09-05 DIAGNOSIS — Z0000 Encounter for general adult medical examination without abnormal findings: Secondary | ICD-10-CM

## 2022-09-05 DIAGNOSIS — I10 Essential (primary) hypertension: Secondary | ICD-10-CM

## 2022-09-05 NOTE — Addendum Note (Signed)
Addended by: Lilyan Punt A on: 09/05/2022 02:34 PM   Modules accepted: Orders

## 2022-09-05 NOTE — Patient Instructions (Signed)

## 2022-09-05 NOTE — Progress Notes (Signed)
   Subjective:    Patient ID: [name], male    DOB: 1965-05-27, 57 y.o.   MRN: [id number]  HPI The patient comes in today for a wellness visit. Here today for wellness   A review of their health history was completed.  A review of medications was also completed.  Any needed refills; no  Eating habits: Trying to improve his eating habits  Falls/  MVA accidents in past few months: no  Regular exercise: trying (walking)  Specialist pt sees on regular basis: orthopedic  Preventative health issues were discussed.   Additional concerns: no concerns or issues   Patient had colonoscopy in 2019 next 02/2026  Review of Systems     Objective:   Physical Exam General-in no acute distress Eyes-no discharge Lungs-respiratory rate normal, CTA CV-no murmurs,RRR Extremities skin warm dry no edema Neuro grossly normal Behavior normal, alert Prostate mild enlargement no nodules  Outpatient Encounter Medications as of 09/05/2022  Medication Sig Note   diltiazem 2 % GEL Apply 1 application topically 2 (two) times daily. Use as directed. 04/12/2021: PRN   pregabalin (LYRICA) 50 MG capsule One BID    psyllium (METAMUCIL SMOOTH TEXTURE) 28 % packet Take 1 packet by mouth at bedtime.    valsartan (DIOVAN) 40 MG tablet Take 1 tablet (40 mg total) by mouth daily.    docusate sodium (COLACE) 100 MG capsule Take 1 capsule (100 mg total) by mouth 2 (two) times daily. (Patient not taking: Reported on 09/05/2022)    ipratropium (ATROVENT) 0.06 % nasal spray Place 2 sprays into both nostrils 4 (four) times daily as needed for rhinitis.    [DISCONTINUED] amoxicillin (AMOXIL) 500 MG capsule 1 taken 3 times daily for 7 days    No facility-administered encounter medications on file as of 09/05/2022.        Assessment & Plan:  1. Well adult exam Adult wellness-complete.wellness physical was conducted today. Importance of diet and exercise were discussed in detail.  Importance of stress reduction  and healthy living were discussed.  In addition to this a discussion regarding safety was also covered.  We also reviewed over immunizations and gave recommendations regarding current immunization needed for age.   In addition to this additional areas were also touched on including: Preventative health exams needed:  Colonoscopy 2018, next 03/15/2026  Patient was advised yearly wellness exam   2. Hyperlipidemia, unspecified hyperlipidemia type No need for statin currently.  Healthy diet  3. Essential hypertension Blood pressure good control continue current measures  Patient had neuropathic pain musculoskeletal pain using pregabalin tolerating well  Follow-up 6 months lab work near that time

## 2022-11-17 ENCOUNTER — Ambulatory Visit: Payer: BC Managed Care – PPO | Admitting: Family Medicine

## 2022-11-17 VITALS — BP 138/78 | HR 73 | Temp 97.3°F | Ht 71.0 in | Wt 250.0 lb

## 2022-11-17 DIAGNOSIS — Z23 Encounter for immunization: Secondary | ICD-10-CM | POA: Diagnosis not present

## 2022-11-17 DIAGNOSIS — R1013 Epigastric pain: Secondary | ICD-10-CM

## 2022-11-17 MED ORDER — SUCRALFATE 1 G PO TABS: 1.0000 g | ORAL_TABLET | Freq: Three times a day (TID) | ORAL | 0 refills | Status: DC

## 2022-11-17 MED ORDER — PANTOPRAZOLE SODIUM 40 MG PO TBEC
40.0000 mg | DELAYED_RELEASE_TABLET | Freq: Every day | ORAL | 2 refills | Status: DC
Start: 2022-11-17 — End: 2023-03-02

## 2022-11-17 MED ORDER — PREGABALIN 50 MG PO CAPS: ORAL_CAPSULE | ORAL | 2 refills | Status: DC

## 2022-11-17 NOTE — Progress Notes (Signed)
Subjective:    Patient ID: Justin Estrada, male    DOB: 1965-12-18, 57 y.o.   MRN: [id number]  HPI Gurgling type burps , abd pain after meals and through the day  Since last week thursday [place] last week Now with stomach bug sx Some N No V No D No fever Did wake up with sx No dysphagia Appetite ok  Review of Systems     Objective:   Physical Exam  General-in no acute distress Eyes-no discharge Lungs-respiratory rate normal, CTA CV-no murmurs,RRR Extremities skin warm dry no edema Neuro grossly normal Behavior normal, alert Abdomen soft no guarding rebound or tenderness      Assessment & Plan:   Dyspepsia-related to the recent illness More than likely some stomach upset related to recent viral illness There could be some gastritis reflux going on but I do not feel the patient needs CT scan or lab work I would recommend Carafate 3 times daily for the next couple weeks then acid blocker for at least 30 days if doing well after that stop the medications.  Avoid spicy foods.  Follow-up if ongoing troubles.

## 2022-12-23 ENCOUNTER — Ambulatory Visit: Payer: BC Managed Care – PPO | Admitting: Family Medicine

## 2022-12-23 ENCOUNTER — Encounter: Payer: Self-pay | Admitting: Family Medicine

## 2022-12-23 VITALS — BP 129/88 | HR 71 | Temp 97.5°F | Ht 71.0 in | Wt 250.2 lb

## 2022-12-23 DIAGNOSIS — R519 Headache, unspecified: Secondary | ICD-10-CM

## 2022-12-23 DIAGNOSIS — G542 Cervical root disorders, not elsewhere classified: Secondary | ICD-10-CM

## 2022-12-23 DIAGNOSIS — I10 Essential (primary) hypertension: Secondary | ICD-10-CM | POA: Diagnosis not present

## 2022-12-23 DIAGNOSIS — R0789 Other chest pain: Secondary | ICD-10-CM | POA: Diagnosis not present

## 2022-12-23 DIAGNOSIS — M792 Neuralgia and neuritis, unspecified: Secondary | ICD-10-CM

## 2022-12-23 MED ORDER — DOXYCYCLINE HYCLATE 100 MG PO TABS: 100.0000 mg | ORAL_TABLET | Freq: Two times a day (BID) | ORAL | 0 refills | Status: DC

## 2022-12-23 MED ORDER — VALSARTAN 80 MG PO TABS: 80.0000 mg | ORAL_TABLET | Freq: Every day | ORAL | 1 refills | Status: DC

## 2022-12-23 MED ORDER — MUPIROCIN 2 % EX OINT: TOPICAL_OINTMENT | CUTANEOUS | 0 refills | Status: DC

## 2022-12-23 NOTE — Progress Notes (Signed)
Subjective:    Patient ID: Justin Estrada, male    DOB: 05/30/1965, 57 y.o.   MRN: [id number]  Discussed the use of AI scribe software for clinical note transcription with the patient, who gave verbal consent to proceed.  History of Present Illness   The patient, with a history of hypertension and neuropathy, presents with ongoing knee pain, which has been diagnosed as requiring a partial knee replacement. The patient reports difficulty with exercise due to the knee pain, but has been trying to maintain a healthier diet. The patient also reports intermittent headaches, described as dull aches in various locations, but denies any associated symptoms such as blurred vision or vomiting.  The patient's neuropathy presents as a burning sensation in the toes, predominantly at night, and is more pronounced after being on their feet during the day. The patient is currently on a low dose of pregabalin (Lyrica) for this condition.  The patient also reports lower back pain, particularly on the left side, which radiates around to the front. This pain is exacerbated by sitting, particularly while driving, and is almost constant. An MRI has revealed some bulging discs, but no surgical intervention has been deemed necessary at this point.  The patient has also been experiencing some upper back and neck pain, particularly in the scapula area, which they have been managing with massage therapy. The patient reports a tingling pain radiating from the left buttock region to the lower abdomen, which is intermittent.  The patient has a history of acid reflux, which has improved, and they have ceased taking medication for this. The patient also reports a recent staph infection in the nasal area, which is being managed with antibiotics and topical treatment.  The patient is compliant with medication and reports no issues with drowsiness or other side effects.         Review of Systems     Objective:    Physical  Exam   VITALS: BP- 118/86, BP- 120/86 HEENT: Tender nodule on nose CHEST: Normal heart sounds, Normal lung sounds     General-in no acute distress Eyes-no discharge Lungs-respiratory rate normal, CTA CV-no murmurs,RRR Extremities skin warm dry no edema Neuro grossly normal Behavior normal, alert       Assessment & Plan:  Assessment and Plan    Hypertension Blood pressure slightly elevated at 120/86. Patient currently on Diovan 40mg . Discussed the long-term benefits of tighter blood pressure control. -Increase Diovan to 80mg  daily. -Check kidney function in 10 days after dose increase. -Follow-up in 4 weeks to reassess blood pressure.  Peripheral Neuropathy Reports of burning sensation in toes, more pronounced at night and after being on feet during the day. Currently on Pregabalin once daily. -Increase Pregabalin to twice daily (morning and evening). -Message in 3-4 weeks to assess response to increased dose.  Lower Back Pain Reports of pain on left side, radiating around to front. Likely nerve irritation. -Continue with physical therapy stretches 3-4 times a week. -Increase Pregabalin as above, which may also help with nerve-related back pain.  Headaches Reports of intermittent dull aches in frontal region. No associated blurred vision, double vision, or vomiting. -Continue current management.  Nasal Staph Infection Reports of sore bump inside nose, likely staph infection. -Prescribe a week course of oral antibiotics. -Prescribe topical antibiotic ointment to be applied inside nose once daily for 3-4 weeks.  Knee Osteoarthritis Scheduled for partial knee replacement due to missing cartilage and bone erosion. -Continue current management and prepare for upcoming surgery.  General Health Maintenance -Encourage consistent daily walks and healthy diet to maintain good health status leading up to knee surgery. -Request MRI reports of back and knee from [facility].      1.  Essential hypertension Blood pressure good control continue current measures - Basic Metabolic Panel  2. Forehead pain More than likely stress related do not feel patient needs MRI  3. Neuropathic pain Has some burning in the feet but also has impingement of a nerve in the neck would benefit from increasing the dose of the Lyrica twice daily send [place] update in several weeks time  4. Cervical nerve root impingement Increase Lyrica twice daily of side effects notify us  5. Musculoskeletal chest pain Previous workup including coronary catheterization negative

## 2022-12-31 LAB — BASIC METABOLIC PANEL
BUN/Creatinine Ratio: 11 (ref 9–20)
BUN: 11 mg/dL (ref 6–24)
CO2: 23 mmol/L (ref 20–29)
Calcium: 9.4 mg/dL (ref 8.7–10.2)
Chloride: 105 mmol/L (ref 96–106)
Creatinine, Ser: 1.04 mg/dL (ref 0.76–1.27)
Glucose: 137 mg/dL — ABNORMAL HIGH (ref 70–99)
Potassium: 4.5 mmol/L (ref 3.5–5.2)
Sodium: 142 mmol/L (ref 134–144)
eGFR: 84 mL/min/{1.73_m2} (ref >59)

## 2023-01-08 ENCOUNTER — Telehealth: Payer: Self-pay | Admitting: *Deleted

## 2023-01-08 NOTE — Telephone Encounter (Signed)
   Pre-operative Risk Assessment    Patient Name: Justin Estrada  DOB: 09-13-1965 MRN: [id number]  Last OV: Dr. Diona Browner 01/21/2022  Upcoming OV: Dr. Diona Browner 03/18/2023    Request for Surgical Clearance    Procedure:   Left medical uni knee arthroplasty.   Date of Surgery:  Clearance 04/07/23                                 Surgeon:  Dr. Ollen Gross Surgeon's Group or Practice Name:  [facility] Phone number:  [phone] Fax number:  [phone]   Type of Clearance Requested:   - Medical    Type of Anesthesia:   Choice   Additional requests/questions:    Signed, Emmit Pomfret   01/08/2023, 3:52 PM

## 2023-01-09 ENCOUNTER — Ambulatory Visit: Payer: BC Managed Care – PPO | Admitting: Family Medicine

## 2023-01-09 VITALS — BP 124/78

## 2023-01-09 DIAGNOSIS — I10 Essential (primary) hypertension: Secondary | ICD-10-CM

## 2023-01-09 DIAGNOSIS — E785 Hyperlipidemia, unspecified: Secondary | ICD-10-CM

## 2023-01-09 DIAGNOSIS — R1013 Epigastric pain: Secondary | ICD-10-CM

## 2023-01-09 NOTE — Progress Notes (Unsigned)
   Subjective:    Patient ID: Justin Estrada, male    DOB: Sep 06, 1965, 57 y.o.   MRN: [id number]  Discussed the use of AI scribe software for clinical note transcription with the patient, who gave verbal consent to proceed.  History of Present Illness   T  The patient has been taking meloxicam for knee pain, but she noticed that her gastrointestinal symptoms seemed to worsen after taking this medication. She only took the medication twice, and she has not taken it since she noticed the worsening of her symptoms.  The patient also has a history of nerve impingement in her lower back and neck, for which she has been seeing a [facility]. She reports some improvement in her symptoms since starting chiropractic treatment.  The patient's blood pressure has been well-controlled on her current regimen of 80mg  of an unspecified medication. She has not noticed any issues with her blood pressure. Her recent blood work, including kidney function and electrolyte levels, was reported to be normal.         Review of Systems     Objective:    Physical Exam   VITALS: BP- 124/78    General-in no acute distress Eyes-no discharge Lungs-respiratory rate normal, CTA CV-no murmurs,RRR Extremities skin warm dry no edema Neuro grossly normal Behavior normal, alert   {Labs (Optional):23779}     Assessment & Plan:  Assessment and Plan    Hypertension Well controlled on 80mg  medication. No reported side effects. -Continue current medication regimen. -Check blood pressure in February or early March.  Dyspepsia Reports of nausea and gurgling sensation in the stomach. No loss of appetite or difficulty swallowing. No weight loss or blood in stools. Possible association with Meloxicam use. -Discontinue Meloxicam and switch to Tylenol for pain relief. -Reach out to gastroenterologist Dr. 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  Musculoskeletal discomfort Reports of discomfort  possibly related to nerve impingement in lower back and neck. Currently receiving chiropractic treatment. -Continue with chiropractic treatment. -Use Tylenol for pain relief as needed.  General Health Maintenance -Continue monitoring symptoms and report any changes. -Return for follow-up in February or early March.      It is hard to know if the patient's dyspepsia along with inability to burp could be this more rare condition will touch base with gastroenterology to see what they have to say regarding this  As for his blood pressure he is doing very well with  Follow-up within 4 to 6 months

## 2023-01-09 NOTE — Telephone Encounter (Signed)
   Name: Justin Estrada  DOB: December 30, 1965  MRN: [id number]  Primary Cardiologist: Nona Dell, MD  Chart reviewed as part of pre-operative protocol coverage. The patient has an upcoming visit scheduled with Dr. Diona Browner on 03/17/2022 at which time clearance can be addressed in case there are any issues that would impact surgical recommendations.  Left knee arthroplasty is not scheduled until 04/07/2023 as below. I added preop FYI to appointment note so that provider is aware to address at time of outpatient visit.  Per office protocol the cardiology provider should forward their finalized clearance decision and recommendations regarding antiplatelet therapy to the requesting party below.    I will route this message as FYI to requesting party and remove this message from the preop box as separate preop APP input not needed at this time.   Please call with any questions.  Joylene Grapes, NP  01/09/2023, 12:20 PM

## 2023-01-12 ENCOUNTER — Other Ambulatory Visit: Payer: Self-pay

## 2023-01-12 DIAGNOSIS — E785 Hyperlipidemia, unspecified: Secondary | ICD-10-CM

## 2023-01-12 DIAGNOSIS — I10 Essential (primary) hypertension: Secondary | ICD-10-CM

## 2023-01-21 ENCOUNTER — Encounter: Payer: Self-pay | Admitting: Family Medicine

## 2023-01-26 ENCOUNTER — Other Ambulatory Visit: Payer: Self-pay

## 2023-01-26 ENCOUNTER — Encounter: Payer: Self-pay | Admitting: Family Medicine

## 2023-01-26 DIAGNOSIS — R1011 Right upper quadrant pain: Secondary | ICD-10-CM

## 2023-01-26 NOTE — Telephone Encounter (Signed)
Nurses It is possible that this could be related to his back We are already putting in a consultation for gastroenterology regarding his inability to burp If he continues to have lower abdominal discomfort he can mention this to Dr. Levon Hedger when he does for that consultation  But in the meantime it would be reasonable to see chiropractor.  I would recommend locally Dr. Ladona Ridgel Alternative option would be utilizing Dr. Magnus Sinning practice in [place]  Please move forward with which chiropractor consultation that he would like to do Reason lumbar pain with impinged nerve

## 2023-01-26 NOTE — Telephone Encounter (Signed)
Nurses Please initiate referral to Dr. Levon Hedger GI Reason-upper abdominal discomfort inability to burp Please keep Todd in the loop thank you

## 2023-02-06 ENCOUNTER — Ambulatory Visit: Payer: BC Managed Care – PPO | Admitting: Family Medicine

## 2023-02-06 VITALS — BP 128/76 | HR 62 | Temp 97.9°F | Ht 71.0 in | Wt 260.0 lb

## 2023-02-06 DIAGNOSIS — G542 Cervical root disorders, not elsewhere classified: Secondary | ICD-10-CM

## 2023-02-06 MED ORDER — PREGABALIN 50 MG PO CAPS: ORAL_CAPSULE | ORAL | 5 refills | Status: DC

## 2023-02-06 NOTE — Progress Notes (Signed)
   Subjective:    Patient ID: Justin Estrada, male    DOB: [birth year], 57 y.o.   MRN: [id number]  HPI Patient with discomfort in the upper back into the rhomboid region radiates around the scapula underneath the arm toward the upper pectoral portion on the left side this is more of an ache and a soreness has been going on for weeks denies any major setbacks or problems states his overall energy level seems to be doing all right no breathing difficulties no wheezing no fever chills or cough overall energy level good Patient has upcoming knee surgery he is hoping to improve this area before having surgery He states he has done medical massage and he has also had his wife do massage which seems to help in the past he has tried dry needling which did not do much patient is on Lyrica currently but it was not on his med list for some reason   Review of Systems     Objective:   Physical Exam General-in no acute distress Eyes-no discharge Lungs-respiratory rate normal, CTA CV-no murmurs,RRR Extremities skin warm dry no edema Neuro grossly normal Behavior normal, alert Blood pressure recheck looks good Subjective tenderness in the rhomboid and scapular region       Assessment & Plan:  Rhomboid pain Recommend massage I believe he is having radicular pain that radiates to the rhomboid and radiates underneath the axilla consistent with C8-T1 or perhaps C6 but we will boost the dose of the Lyrica 1 in the morning 2 in the evening if this does not adequately help over the next few weeks I would recommend doing scan of the cervical spine and a chest x-ray given that this is been going on for several months and getting worse  Patient will send [place] an update within the next 2 to 3 weeks

## 2023-02-23 ENCOUNTER — Other Ambulatory Visit (INDEPENDENT_AMBULATORY_CARE_PROVIDER_SITE_OTHER): Payer: Self-pay | Admitting: Gastroenterology

## 2023-02-23 ENCOUNTER — Encounter (INDEPENDENT_AMBULATORY_CARE_PROVIDER_SITE_OTHER): Payer: Self-pay | Admitting: Gastroenterology

## 2023-02-23 DIAGNOSIS — K602 Anal fissure, unspecified: Secondary | ICD-10-CM

## 2023-02-23 MED ORDER — DILTIAZEM GEL 2 %: 1.0000 | Freq: Two times a day (BID) | CUTANEOUS | 0 refills | Status: DC

## 2023-02-24 ENCOUNTER — Other Ambulatory Visit (INDEPENDENT_AMBULATORY_CARE_PROVIDER_SITE_OTHER): Payer: Self-pay

## 2023-02-24 DIAGNOSIS — K602 Anal fissure, unspecified: Secondary | ICD-10-CM

## 2023-02-24 MED ORDER — DILTIAZEM GEL 2 %: 1.0000 | Freq: Two times a day (BID) | CUTANEOUS | 0 refills | Status: AC

## 2023-03-02 ENCOUNTER — Ambulatory Visit (INDEPENDENT_AMBULATORY_CARE_PROVIDER_SITE_OTHER): Payer: 59 | Admitting: Gastroenterology

## 2023-03-02 ENCOUNTER — Encounter (INDEPENDENT_AMBULATORY_CARE_PROVIDER_SITE_OTHER): Payer: Self-pay | Admitting: Gastroenterology

## 2023-03-02 VITALS — BP 153/87 | HR 77 | Temp 97.7°F | Ht 71.0 in | Wt 260.7 lb

## 2023-03-02 DIAGNOSIS — R142 Eructation: Secondary | ICD-10-CM | POA: Diagnosis not present

## 2023-03-02 DIAGNOSIS — R109 Unspecified abdominal pain: Secondary | ICD-10-CM

## 2023-03-02 DIAGNOSIS — R140 Abdominal distension (gaseous): Secondary | ICD-10-CM

## 2023-03-02 NOTE — Progress Notes (Signed)
 Toribio Fortune, M.D. Gastroenterology & Hepatology [facility] Gastroenterology [address] [place], [place] [address]  Primary Care Physician: Alphonsa Glendia LABOR, MD [address] [place] [address]  I will communicate my assessment and recommendations to the referring MD via EMR.  Problems: 1.  Chronic idiopathic constipation 2. History of anal fissure 3. History of gallbladder polyps 4. Recurrent bloating 5.  Inability to burp  - ?retrograde cricopharyngeal dysfunction    History of Present Illness: IZAC FAULKENBERRY is a 58 y.o. male with past medical history of chronic idiopathic constipation complicated by anal fissures and history of gallbladder polyps, who presents for evaluation of abdominal discomfort and inability to burp.  The patient was last seen on 04/15/2021. At that time, the patient was continued on Metamucil and stool softeners for constipation.  Ultrasound of the abdomen was performed and has been followed since then with the last ultrasound performed on 04/25/2022 -gallbladder polyps measured up to 4.8 mm.  Recommended to have repeat ultrasound in 2 years.  Patient reports that he has presented recurrent episodes of bloating of his abdomen after having a meal. He reports that after he has felt some gurgling there is some relief of his bloating, but he has never been able to spontaneously burp. 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 He asked to his PCP about it and was referred again to our clinic.   He states that he was prescribed sucralfate  and pantoprazole , which provided some relief but he is still feeling some bloating intermittently.  States his father and daughter have similar symptoms so he believes this may be genetic.  He also reports that he has presented some ticking sensation in the back of his throat but this is not present all the time when he is having  the bloating .  States that he has had some pain in the left side of his abdomen, especially if he sits or leans on his side. Pain is 3/10 and is intermittent.  The patient denies having any nausea, vomiting, fever, chills, hematochezia, melena, hematemesis, abdominal distention, abdominal pain, diarrhea, jaundice, pruritus or weight loss.  Last Colonoscopy:June 2018, internal hemorrhoids and anal tags Last Endoscopy:Aug 2019 for chest/epigastric pain, prepyloric scar and gastritis, biopsy neg for H pylori.   Past Medical History: Past Medical History:  Diagnosis Date   Anal fissure    History of cardiac catheterization    Normal coronary arteries January 2021    Past Surgical History: Past Surgical History:  Procedure Laterality Date   BIOPSY  10/12/2017   Procedure: BIOPSY;  Surgeon: Golda Claudis PENNER, MD;  Location: [facility];  Service: Endoscopy;;  antral biopsy    COLONOSCOPY     10 plus years ago by Dr.Rourk   COLONOSCOPY N/A 07/28/2016   [facility]: anal tags internal hemorrhoids   ESOPHAGOGASTRODUODENOSCOPY N/A 10/12/2017   Rehman chest/epigastric pain, prepyloric scar and gastritis, biopsy neg for H pylori.   EXCISION OF SKIN TAG N/A 09/30/2017   Procedure: EXCISION OF PERIANAL SKIN TAGS;  Surgeon: Kallie Manuelita BROCKS, MD;  Location: [facility] ORS;  Service: General;  Laterality: N/A;   foot surgery  for bone spurs Bilateral    LEFT HEART CATH AND CORONARY ANGIOGRAPHY N/A 03/11/2019   Procedure: LEFT HEART CATH AND CORONARY ANGIOGRAPHY;  Surgeon: Claudene Victory ORN, MD;  Location: [facility] INVASIVE CV LAB;  Service: Cardiovascular;  Laterality: N/A;   UPPER GASTROINTESTINAL ENDOSCOPY  10 plus years ago by Dr.Rourk    Family History: Family History  Problem Relation Age of Onset   Hypertension Mother    Lung cancer Mother    Liver cancer Mother    Hypertension Father    Congestive Heart Failure Father    Atrial fibrillation Brother     Social History: Social History    Tobacco Use  Smoking Status Never  Smokeless Tobacco Never   Social History   Substance and Sexual Activity  Alcohol Use No   Alcohol/week: 0.0 standard drinks of alcohol   Social History   Substance and Sexual Activity  Drug Use No    Allergies: No Known Allergies  Medications: Current Outpatient Medications  Medication Sig Dispense Refill   diltiazem  2 % GEL Apply 1 Application topically 2 (two) times daily. 60 g 0   docusate sodium  (COLACE) 100 MG capsule Take 1 capsule (100 mg total) by mouth 2 (two) times daily. 60 capsule 0   pregabalin  (LYRICA ) 50 MG capsule One in the morning and two in the evening 90 capsule 5   psyllium (METAMUCIL SMOOTH TEXTURE) 28 % packet Take 1 packet by mouth at bedtime.     valsartan  (DIOVAN ) 80 MG tablet Take 1 tablet (80 mg total) by mouth daily. 90 tablet 1   No current facility-administered medications for this visit.    Review of Systems: GENERAL: negative for malaise, night sweats HEENT: No changes in hearing or vision, no nose bleeds or other nasal problems. NECK: Negative for lumps, goiter, pain and significant neck swelling RESPIRATORY: Negative for cough, wheezing CARDIOVASCULAR: Negative for chest pain, leg swelling, palpitations, orthopnea GI: SEE HPI MUSCULOSKELETAL: Negative for joint pain or swelling, back pain, and muscle pain. SKIN: Negative for lesions, rash PSYCH: Negative for sleep disturbance, mood disorder and recent psychosocial stressors. HEMATOLOGY Negative for prolonged bleeding, bruising easily, and swollen nodes. ENDOCRINE: Negative for cold or heat intolerance, polyuria, polydipsia and goiter. NEURO: negative for tremor, gait imbalance, syncope and seizures. The remainder of the review of systems is noncontributory.   Physical Exam: BP (!) 153/87 (BP Location: Left Arm, Patient Position: Sitting, Cuff Size: Large)   Pulse 77   Temp 97.7 F (36.5 C) (Oral)   Ht 5' 11 (1.803 m)   Wt 260 lb 11.2 oz  (118.3 kg)   BMI 36.36 kg/m  GENERAL: The patient is AO x3, in no acute distress. HEENT: Head is normocephalic and atraumatic. EOMI are intact. Mouth is well hydrated and without lesions. NECK: Supple. No masses LUNGS: Clear to auscultation. No presence of rhonchi/wheezing/rales. Adequate chest expansion HEART: RRR, normal s1 and s2. ABDOMEN: Soft, nontender, no guarding, no peritoneal signs, and nondistended. BS +. No masses. EXTREMITIES: Without any cyanosis, clubbing, rash, lesions or edema. NEUROLOGIC: AOx3, no focal motor deficit. SKIN: no jaundice, no rashes  Imaging/Labs: as above  I personally reviewed and interpreted the available labs, imaging and endoscopic files.  Impression and Plan: JAMARIAN JACINTO is a 58 y.o. male with past medical history of chronic idiopathic constipation complicated by anal fissures and history of gallbladder polyps, who presents for evaluation of abdominal discomfort and inability to burp.  Patient has presented longstanding history of intermittent bloating and inability to burp for multiple years.  He has not presented any other red flag signs but has been concerned about the etiology of his symptoms.  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  We discussed that this is a very rare disorder that requires further  testing with functional high-resolution manometry.  I explained to the patient that the disorder is usually treated by ENTs or gastroenterologist.  Upon confirmation of this disorder, he may elect to undergo endoscopic evaluation under general anesthesia with possible Botox injection of the upper esophageal sphincter to improve the burping episodes.  He understood this.  For now he would like to hold off on proceeding with further testing unless his symptoms worsen.  He will call us  back if he changes his mind.  Regarding his bloating, this symptom is separate from his inability to burp.  Will order blood serology to  evaluate for celiac disease.  He will benefit from implementing a low FODMAP diet.  May consider further testing with gastric emptying study if symptoms persist.  -The patient should call us  back if interested to proceed with esophageal manometry and pH impedance testing at [facility] -Celiac serology - Patient was counseled about the benefit of implementing a low FODMAP to improve symptoms and recurrent episodes. A dietary list was provided to the patient.  All questions were answered.      Toribio Fortune, MD Gastroenterology and Hepatology [facility] Gastroenterology

## 2023-03-02 NOTE — Patient Instructions (Signed)
 Please call us  back if interested to proceed with esophageal manometry and pH impedance testing at [facility] Perform blood workup  Patient was counseled about the benefit of implementing a low FODMAP to improve symptoms and recurrent episodes. A dietary list was provided to the patient.

## 2023-03-09 ENCOUNTER — Ambulatory Visit: Payer: BC Managed Care – PPO | Admitting: Family Medicine

## 2023-03-11 ENCOUNTER — Encounter: Payer: Self-pay | Admitting: Family Medicine

## 2023-03-11 ENCOUNTER — Other Ambulatory Visit: Payer: Self-pay | Admitting: Family Medicine

## 2023-03-11 NOTE — Telephone Encounter (Signed)
 Nurses It does sound like we will need to do a follow-up visit before his March visit in order to help provide necessary documentation for scan We may utilize a later in the afternoon appointment somewhere in the next 2 weeks Thanks-Dr. Geralyn Knee

## 2023-03-15 ENCOUNTER — Encounter (INDEPENDENT_AMBULATORY_CARE_PROVIDER_SITE_OTHER): Payer: Self-pay | Admitting: Gastroenterology

## 2023-03-16 ENCOUNTER — Ambulatory Visit: Payer: 59 | Attending: Cardiology | Admitting: Cardiology

## 2023-03-16 ENCOUNTER — Encounter: Payer: Self-pay | Admitting: Cardiology

## 2023-03-16 VITALS — BP 134/70 | HR 73 | Ht 71.0 in | Wt 257.0 lb

## 2023-03-16 DIAGNOSIS — Z01810 Encounter for preprocedural cardiovascular examination: Secondary | ICD-10-CM

## 2023-03-16 DIAGNOSIS — I10 Essential (primary) hypertension: Secondary | ICD-10-CM

## 2023-03-16 NOTE — Progress Notes (Signed)
    Cardiology Office Note  Date: 03/16/2023   ID: Jcion, [place] Feb 25, 1965, MRN [id number]  History of Present Illness: Justin Estrada is a 58 y.o. male last seen in November 2023.  He is here for a follow-up visit.  Reports no new exertional symptoms, NYHA class II dyspnea, no palpitations or syncope.  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  He is planned for left knee arthroplasty in February with Dr. Lequita Halt.  RCRI perioperative cardiac risk index is 0 points, 3.9% chance of major adverse cardiac event.  I reviewed his medications.  Currently on lisinopril.  He follows regularly with Dr. Gerda Diss.  I reviewed his ECG today which shows sinus rhythm with Q-wave in lead III, possibly normal variant, nonspecific ST changes.  Physical Exam: VS:  BP 134/70   Pulse 73   Ht 5\' 11"  (1.803 m)   Wt 257 lb (116.6 kg)   SpO2 98%   BMI 35.84 kg/m , BMI Body mass index is 35.84 kg/m.  Wt Readings from Last 3 Encounters:  03/16/23 257 lb (116.6 kg)  03/02/23 260 lb 11.2 oz (118.3 kg)  02/06/23 260 lb (117.9 kg)    General: Patient appears comfortable at rest. HEENT: Conjunctiva and lids normal. Neck: Supple, no elevated JVP or carotid bruits. Lungs: Clear to auscultation, nonlabored breathing at rest. Cardiac: Regular rate and rhythm, no S3 or significant systolic murmur, no pericardial rub. Extremities: No pitting edema.  ECG:  An ECG dated 01/21/2022 was personally reviewed today and demonstrated:  Sinus rhythm with nonspecific ST-T changes.  Labwork: 09/01/2022: ALT 28; AST 23; Hemoglobin 16.6; Platelets 330 12/30/2022: BUN 11; Creatinine, Ser 1.04; Potassium 4.5; Sodium 142     Component Value Date/Time   CHOL 179 09/01/2022 0808   TRIG 150 (H) 09/01/2022 0808   HDL 36 (L) 09/01/2022 0808   CHOLHDL 5.0 09/01/2022 0808   LDLCALC 116 (H) 09/01/2022 0808   Other Studies Reviewed Today:  No interval cardiac testing for review today.  Assessment and Plan:  1.   Preoperative cardiac evaluation in a 58 year old male with history of hypertension, normal coronary arteries at cardiac catheterization in 2021.  ECG reviewed.  He does not report any new exertional symptoms and has overall low anticipated cardiac risk with surgery as outlined above.  No additional cardiac testing planned prior to left knee arthroplasty with Dr. Lequita Halt next month.  2.  Primary hypertension, continues on lisinopril with follow-up by Dr. Gerda Diss.  Disposition:  Follow up  1 year.  Signed, Jonelle Sidle, M.D., F.A.C.C. [facility]

## 2023-03-16 NOTE — Telephone Encounter (Signed)
Dr. Levon Hedger, the celiac panel did not drop in epic but it is under the [facility] tab in epic for you to review.

## 2023-03-16 NOTE — Patient Instructions (Signed)

## 2023-03-18 ENCOUNTER — Ambulatory Visit: Payer: BC Managed Care – PPO | Admitting: Cardiology

## 2023-03-19 ENCOUNTER — Ambulatory Visit: Payer: 59 | Admitting: Family Medicine

## 2023-03-19 VITALS — BP 128/72 | HR 61 | Temp 97.3°F | Ht 71.0 in | Wt 258.0 lb

## 2023-03-19 DIAGNOSIS — M549 Dorsalgia, unspecified: Secondary | ICD-10-CM | POA: Diagnosis not present

## 2023-03-19 DIAGNOSIS — M792 Neuralgia and neuritis, unspecified: Secondary | ICD-10-CM

## 2023-03-19 DIAGNOSIS — G542 Cervical root disorders, not elsewhere classified: Secondary | ICD-10-CM | POA: Diagnosis not present

## 2023-03-19 MED ORDER — PREGABALIN 50 MG PO CAPS: ORAL_CAPSULE | ORAL | 4 refills | Status: DC

## 2023-03-19 NOTE — Progress Notes (Signed)
   Subjective:    Patient ID: Justin Estrada, male    DOB: August 23, 1965, 58 y.o.   MRN: [id number]  HPI Very nice patient Here today with several issues that concern him 1.  He has pain in the left rib area along the axillary line lower portion denies any abdominal pain with this no nausea vomiting diarrhea.  No difficulty urinating.  No fever or chills.  Hurts with certain movements.  He attributes some of the discomfort when he gets in and out of his car He is seeing a chiropractor they are working on several issues with his neck as well as his back and they have shown him some exercises to do  #2-patient does have intermittent numbness in the hands it is not severe occasionally wakes up with it at night no weakness in the hands we did discuss carpal tunnel patient is not doing any measures for currently and prefers to watch the issue for now  #3-he relates some pain in the upper trapezius on the left side into his neck sometimes into the shoulder as well no weakness in the shoulder has good range of motion of the shoulder patient denies any severe pain down the arm and it is more in the upper trapezius portion   Review of Systems     Objective:   Physical Exam General-in no acute distress Eyes-no discharge Lungs-respiratory rate normal, CTA CV-no murmurs,RRR Extremities skin warm dry no edema Neuro grossly normal Behavior normal, alert Subjective discomfort upper trapezius left side cervical spine within normal limits Lower back subjective discomfort along the area of the ribs on the left axillary line more the lower portion but not having severe trouble currently       Assessment & Plan:  The area of the ribs more than likely musculoskeletal May use anti-inflammatory gentle stretches continue with chiropractic care  Cervical impingement with more than likely cervical arthritis patient states he did have x-rays with the chiropractor he will try to get [place] a copy or at least take  pictures of it to show [place]  He is currently utilizing Lyrica 1 in the morning 2 in the evening which is doing well for him  He has a follow-up visit later in the spring annual follow-up sooner if any problems he will do his lab work before that visit  He is aware that if he starts having weakness in the arms or severe pain to notify us also if he starts having frequent numbness in the hands may well need to do nerve conduction studies  He will continue his visits with chiropractic and keep [place] updated

## 2023-03-23 ENCOUNTER — Encounter: Payer: Self-pay | Admitting: Family Medicine

## 2023-04-10 IMAGING — DX DG ELBOW COMPLETE 3+V*L*
4 series · 4 of 4 positions shown · non-contrast
Comparison: No recent prior.

CLINICAL DATA: Left lateral elbow pain x 3 weeks.  No known injury.

EXAM:
LEFT ELBOW - COMPLETE 3+ VIEW

[elbow ap]
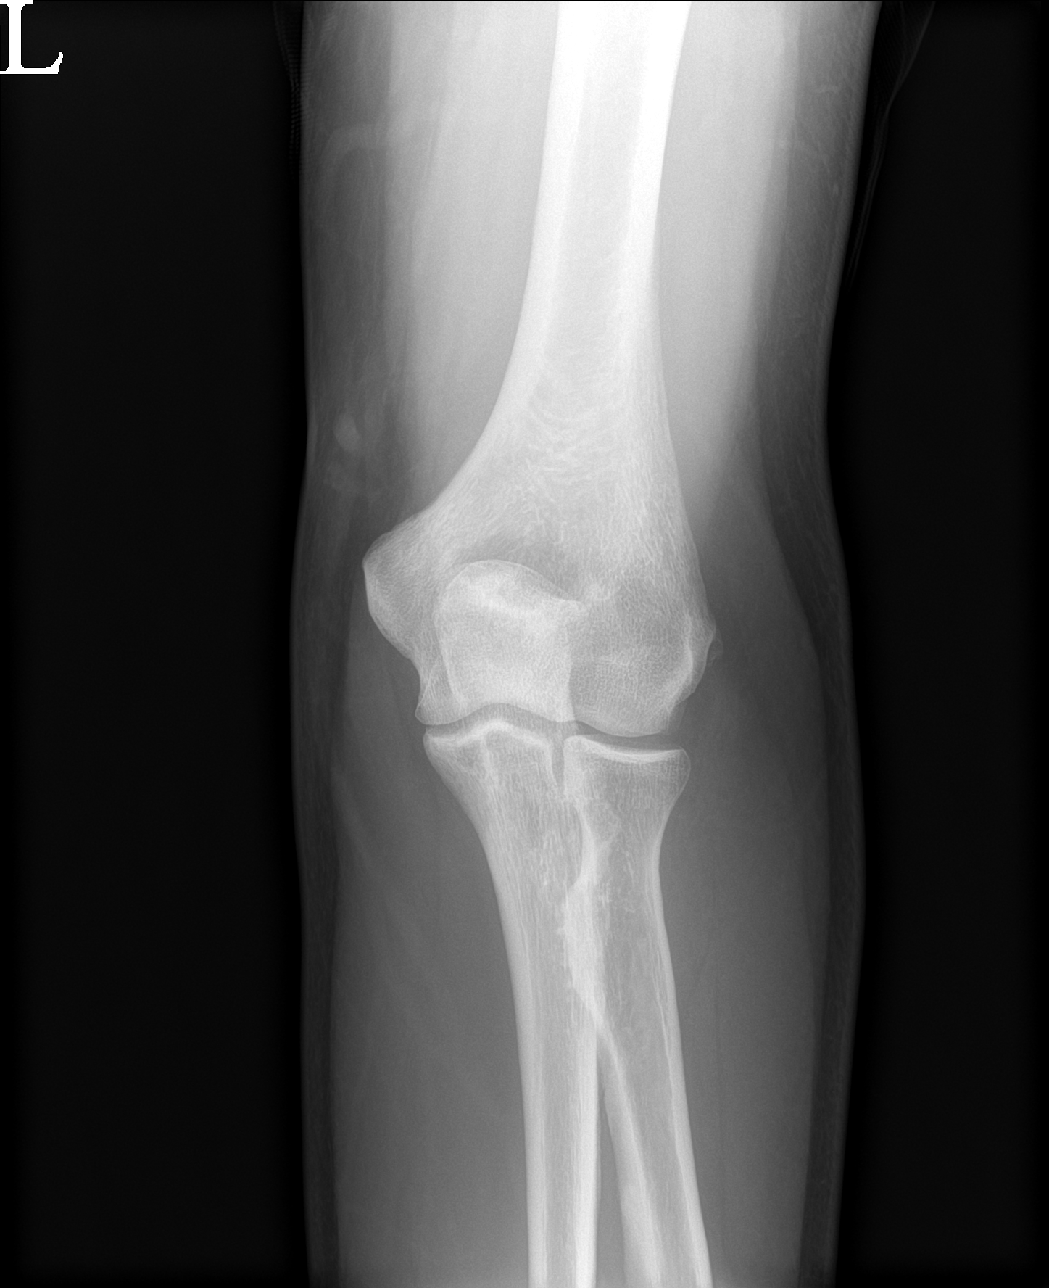

[elbow obl (1 of 2)]
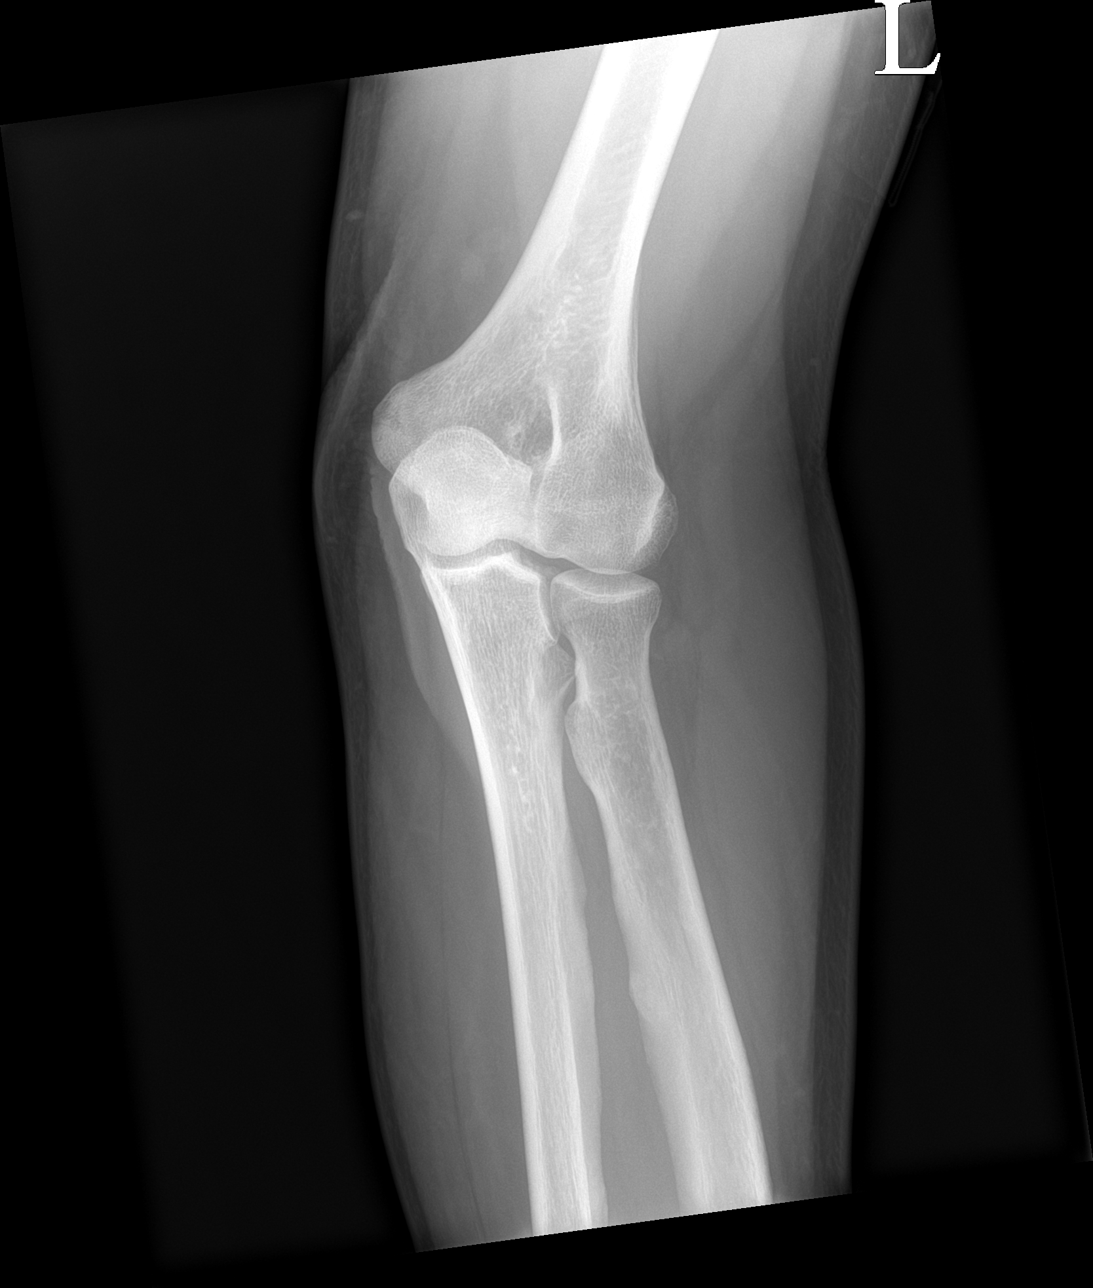

[elbow lat]
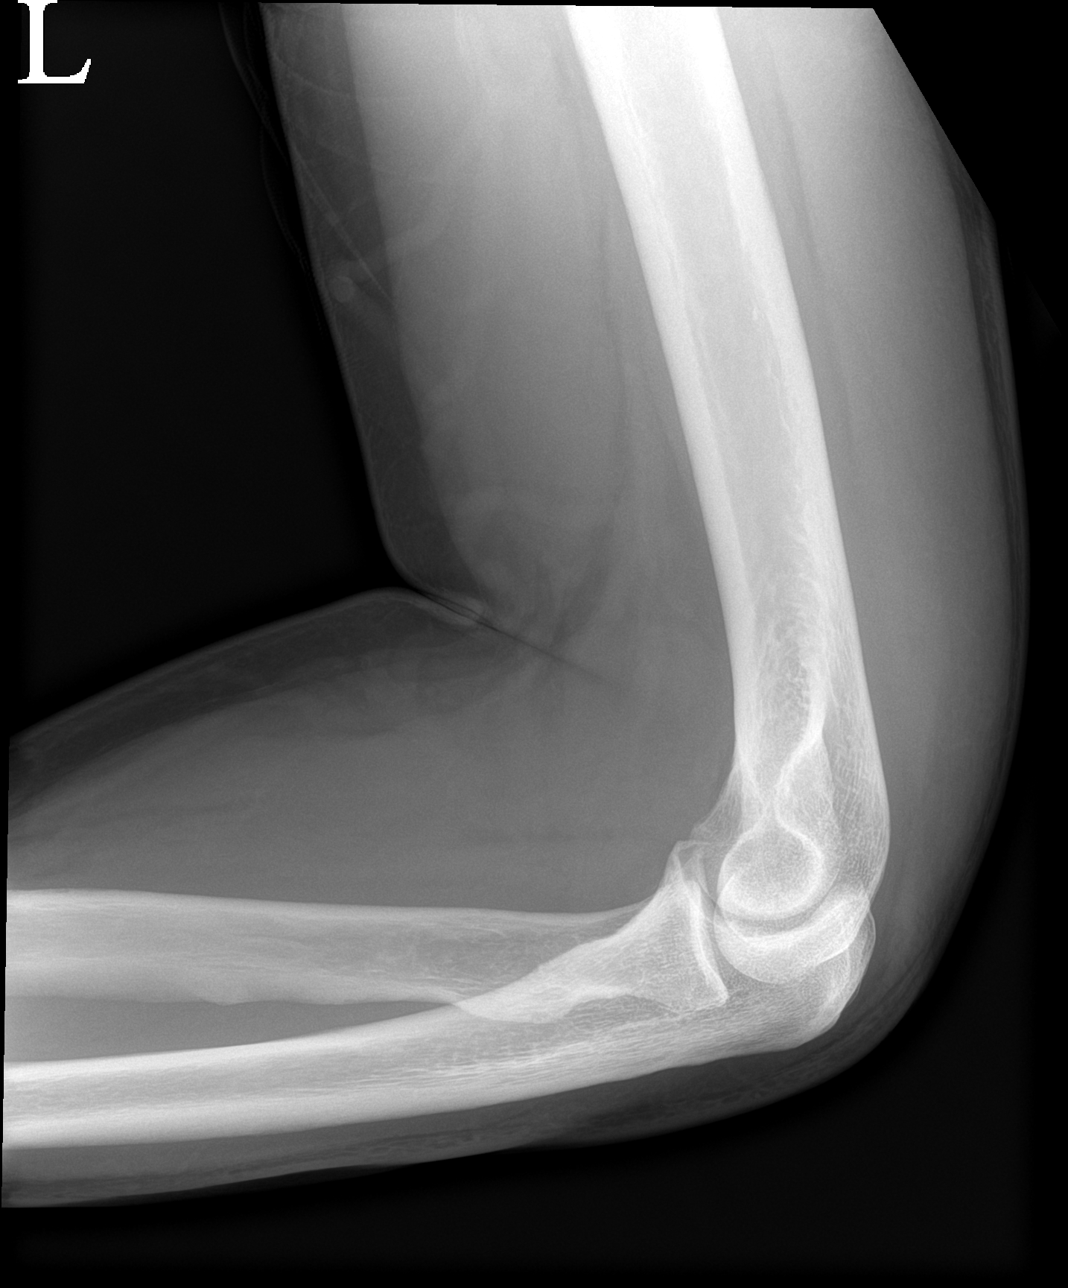

[elbow obl (2 of 2)]
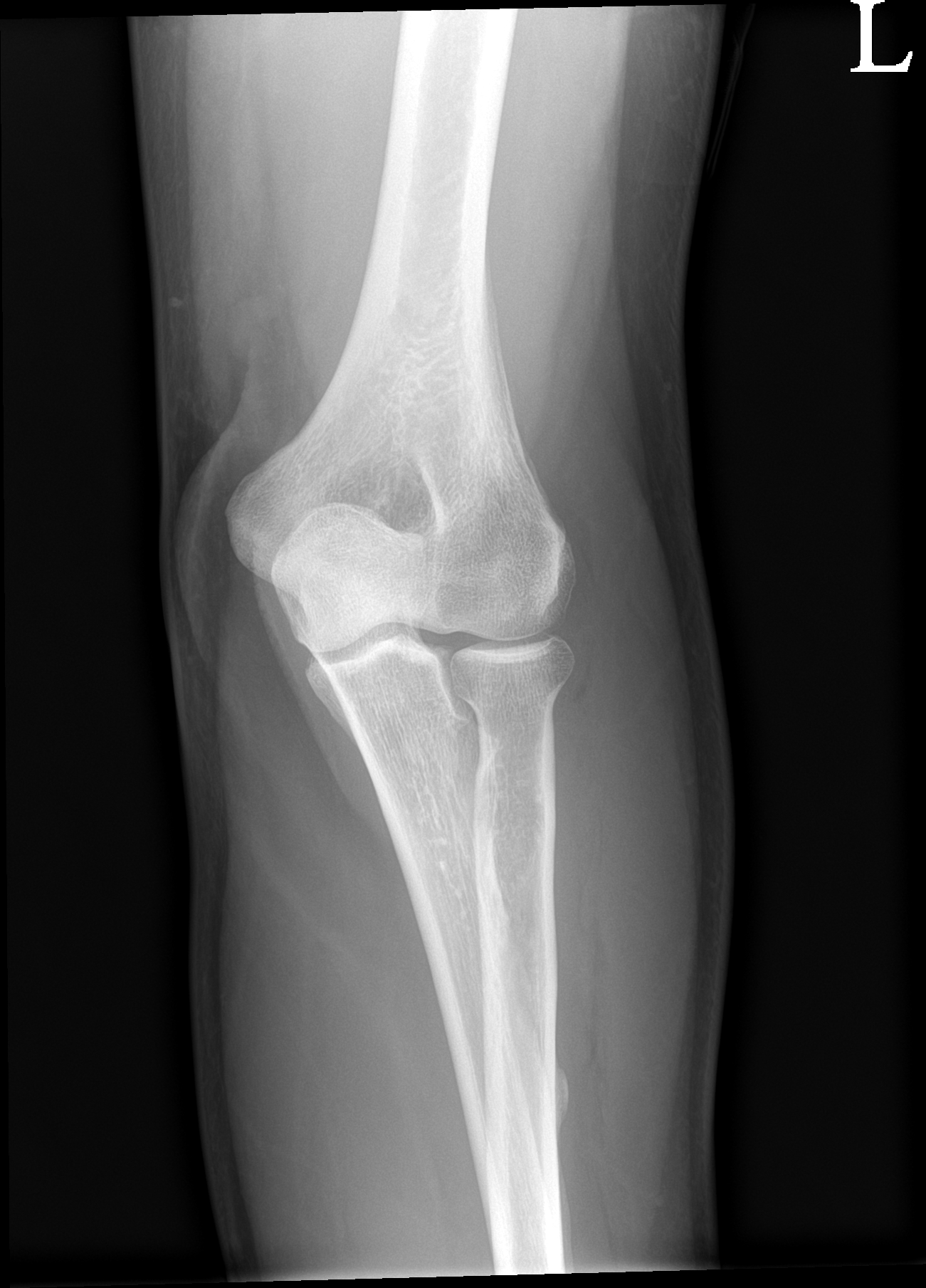

[4 of 4 positions shown; findings below may reference images not displayed]

FINDINGS: Degenerative changes noted about the left elbow. Small corticated
bony density noted adjacent to the radial epicondyle of the humerus.
This is consistent old ligamentous injury. No evidence of acute
fracture or dislocation.
IMPRESSION: 1.  Degenerative changes left elbow.

2. Small corticated bony density noted adjacent to the radial
epicondyle of the distal humerus. This is consistent old ligamentous
injury. No acute abnormality identified.

## 2023-04-13 IMAGING — US US ABDOMEN LIMITED
1 series · 14 of 25 positions shown · non-contrast
Comparison: Ultrasound dated 11/18/2019.

CLINICAL DATA: Follow-up gallbladder polyps.

EXAM:
ULTRASOUND ABDOMEN LIMITED RIGHT UPPER QUADRANT

[Series 1: us abdomen limited ruq (liver/gb) · 14 of 86 slices shown]
[im 1/86]
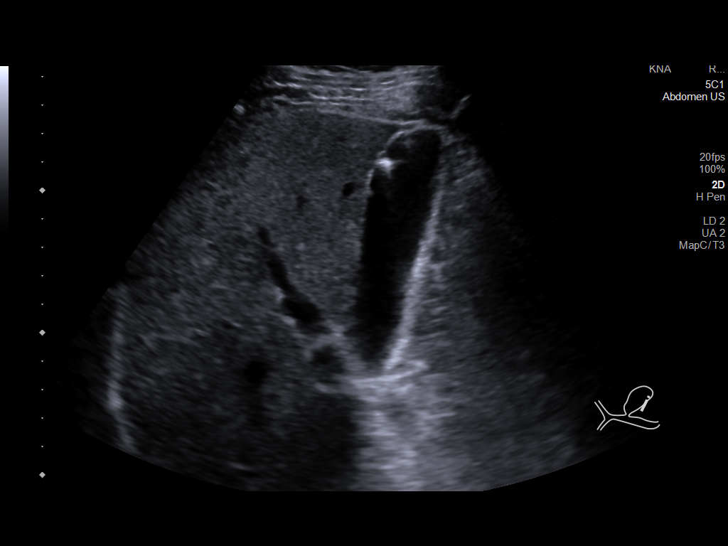
[im 8/86]
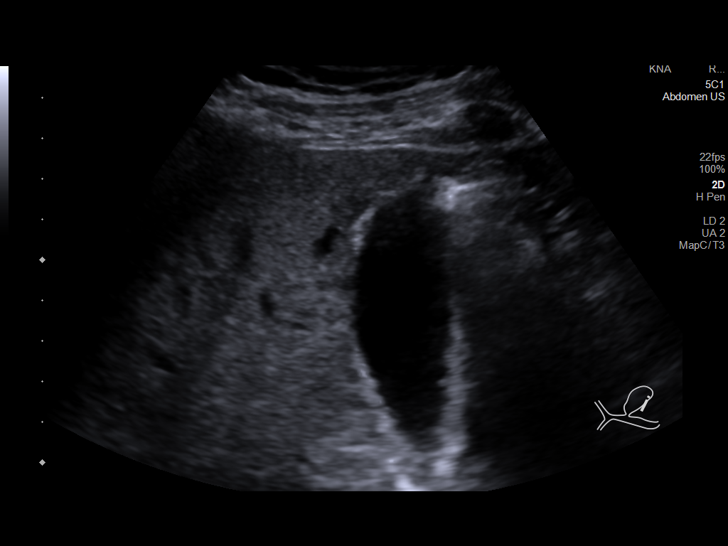
[im 15/86]
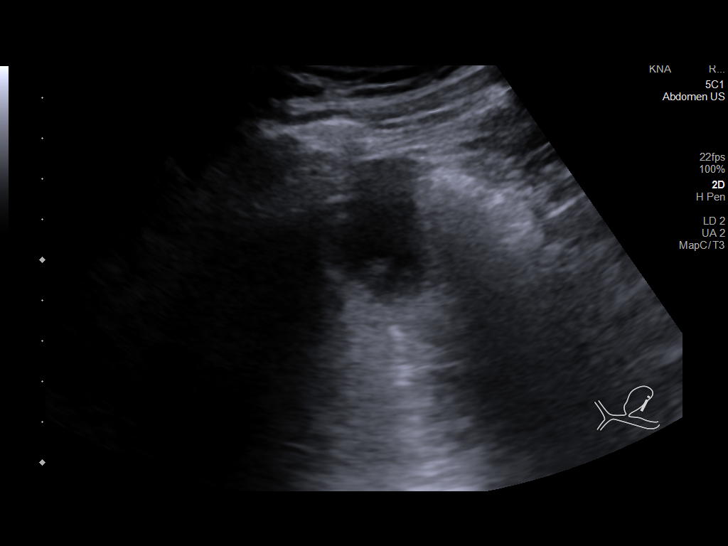
[im 22/86]
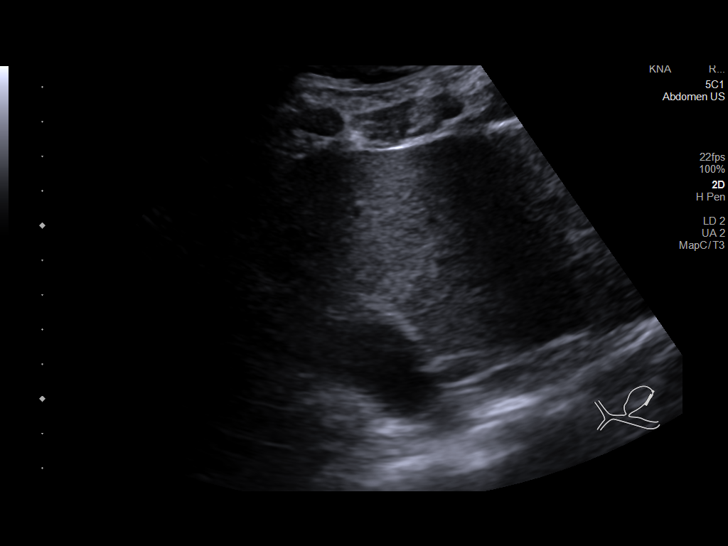
[im 29/86]
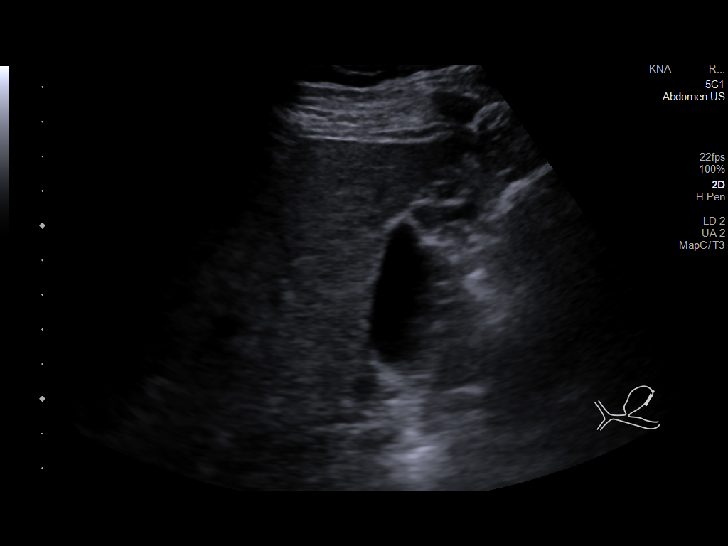
[im 32/86]
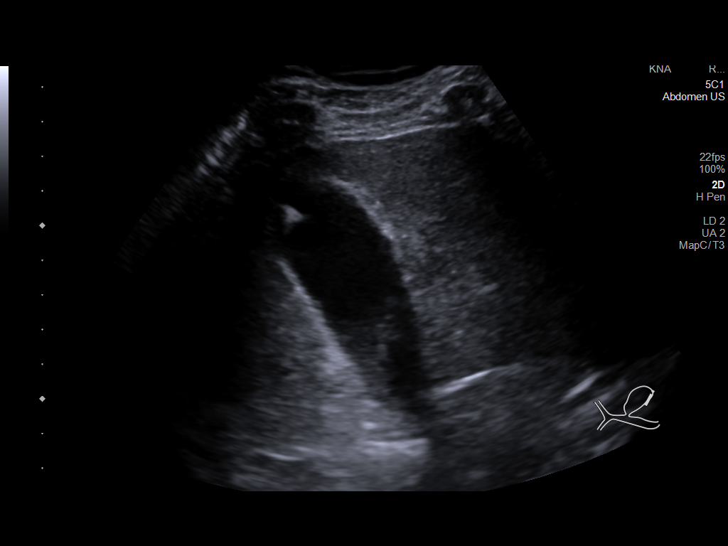
[im 39/86]
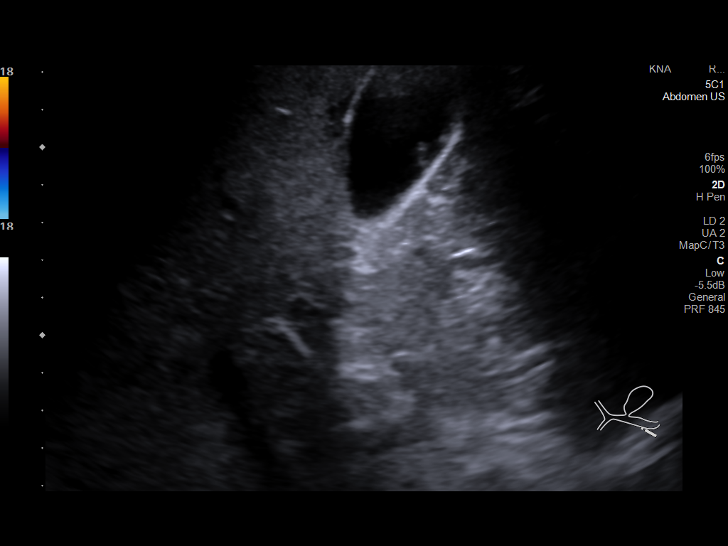
[im 47/86]
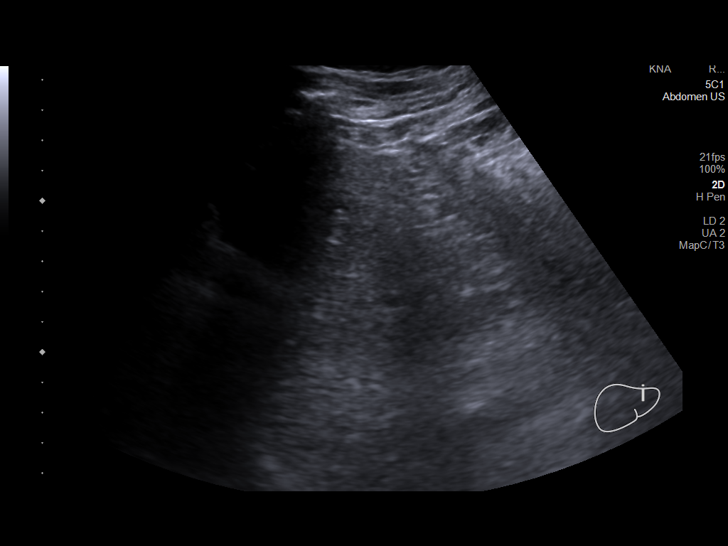
[im 54/86]
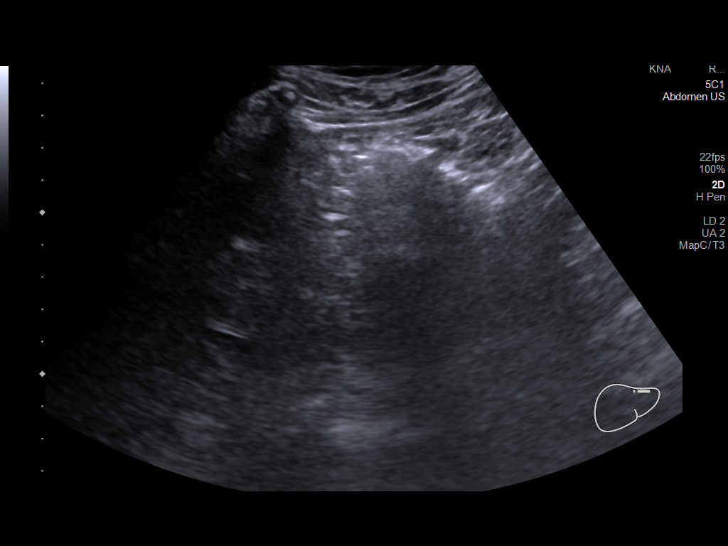
[im 57/86]
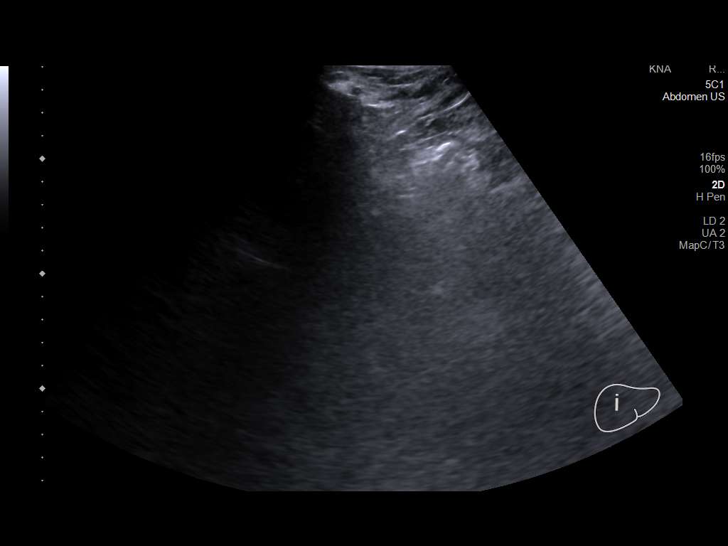
[im 64/86]
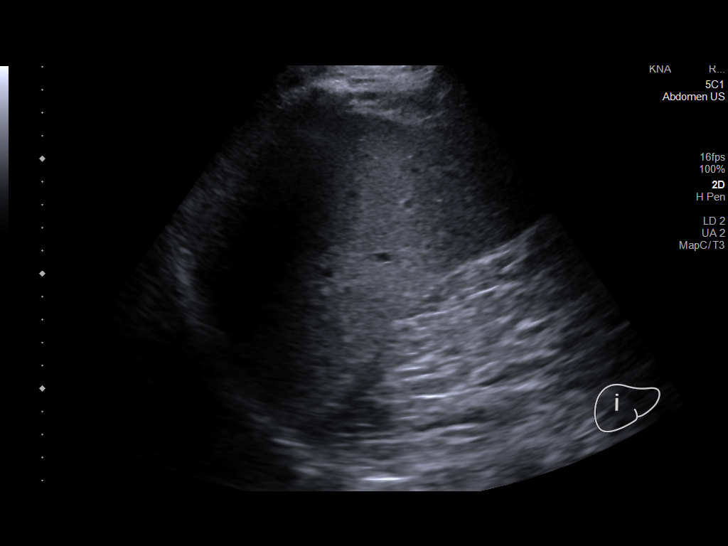
[im 71/86]
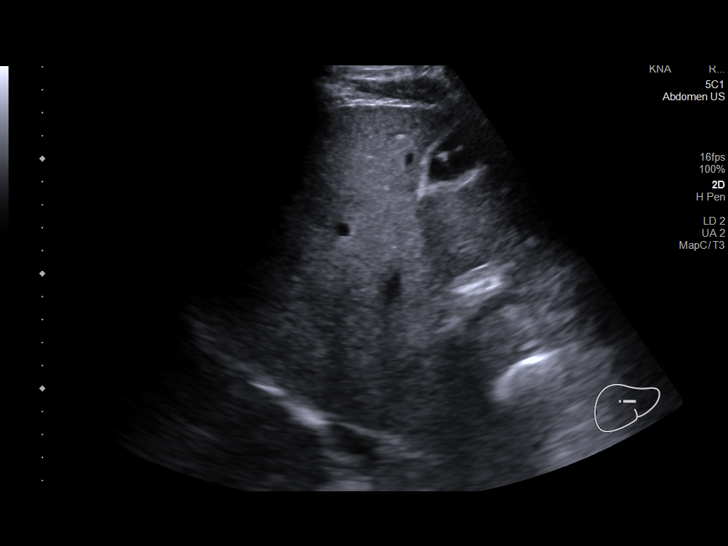
[im 78/86]
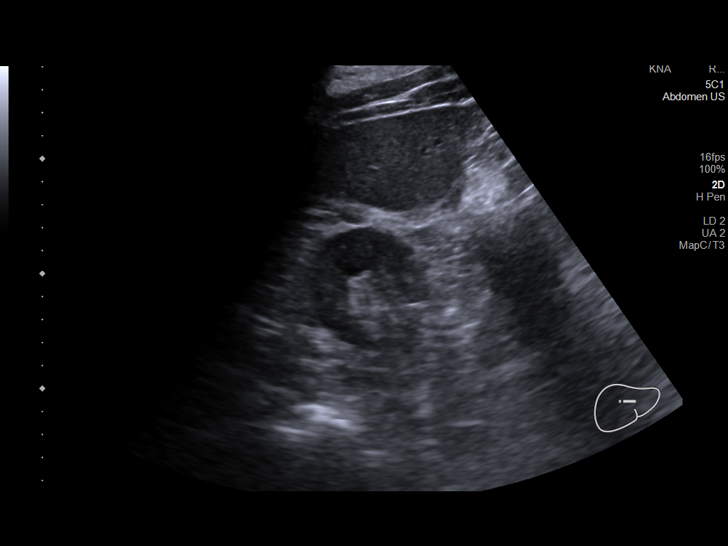
[im 86/86]
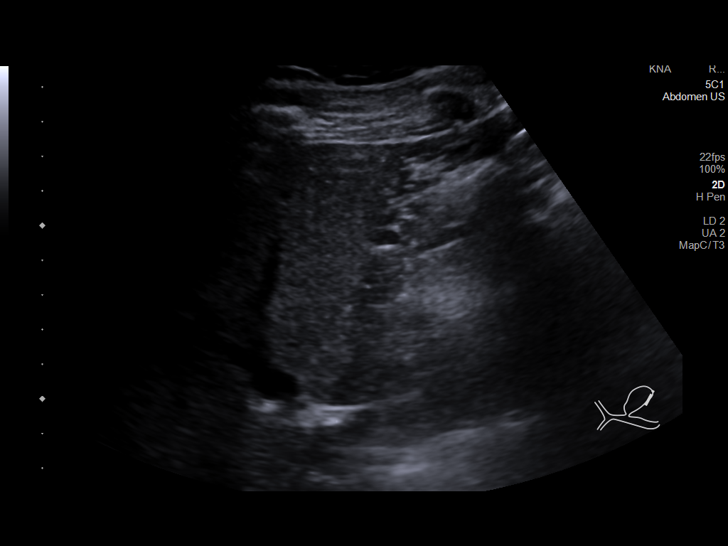

[14 of 25 positions shown; findings below may reference images not displayed]

FINDINGS: Evaluation is limited due to body habitus and overlying bowel gas.

Gallbladder:

No gallstones or wall thickening visualized. No sonographic Murphy
sign noted by sonographer. Several gallbladder polyps measure up to
7 mm.

Common bile duct:

Diameter: 2 mm

Liver:

There is diffuse increased liver echogenicity most commonly seen in
the setting of fatty infiltration. Superimposed inflammation or
fibrosis is not excluded. Clinical correlation is recommended.
Portal vein is patent on color Doppler imaging with normal direction
of blood flow towards the liver.

Other: None.
IMPRESSION: 1. Several gallbladder polyps measure up to 7 mm relatively similar
to prior ultrasound.
2. Fatty liver.

## 2023-05-01 ENCOUNTER — Emergency Department (HOSPITAL_COMMUNITY)
Admission: EM | Admit: 2023-05-01 | Discharge: 2023-05-02 | Disposition: A | Discharge status: Home / Self Care | Attending: Emergency Medicine | Admitting: Emergency Medicine

## 2023-05-01 ENCOUNTER — Emergency Department (HOSPITAL_COMMUNITY)

## 2023-05-01 ENCOUNTER — Other Ambulatory Visit: Payer: Self-pay

## 2023-05-01 DIAGNOSIS — I10 Essential (primary) hypertension: Secondary | ICD-10-CM | POA: Insufficient documentation

## 2023-05-01 DIAGNOSIS — R1012 Left upper quadrant pain: Secondary | ICD-10-CM | POA: Diagnosis present

## 2023-05-01 DIAGNOSIS — Z79899 Other long term (current) drug therapy: Secondary | ICD-10-CM | POA: Diagnosis not present

## 2023-05-01 LAB — BASIC METABOLIC PANEL
Anion gap: 9 (ref 5–15)
BUN: 13 mg/dL (ref 6–20)
CO2: 26 mmol/L (ref 22–32)
Calcium: 9.1 mg/dL (ref 8.9–10.3)
Chloride: 100 mmol/L (ref 98–111)
Creatinine, Ser: 1.21 mg/dL (ref 0.61–1.24)
GFR, Estimated: >60 mL/min (ref >60)
Glucose, Bld: 142 mg/dL — ABNORMAL HIGH (ref 70–99)
Potassium: 3.9 mmol/L (ref 3.5–5.1)
Sodium: 135 mmol/L (ref 135–145)

## 2023-05-01 LAB — CBC WITH DIFFERENTIAL/PLATELET
Abs Immature Granulocytes: 0.03 10*3/uL (ref 0.00–0.07)
Basophils Absolute: 0.1 10*3/uL (ref 0.0–0.1)
Basophils Relative: 1 %
Eosinophils Absolute: 0.1 10*3/uL (ref 0.0–0.5)
Eosinophils Relative: 2 %
HCT: 49 % (ref 39.0–52.0)
Hemoglobin: 16.1 g/dL (ref 13.0–17.0)
Immature Granulocytes: 1 %
Lymphocytes Relative: 22 %
Lymphs Abs: 1.4 10*3/uL (ref 0.7–4.0)
MCH: 29.9 pg (ref 26.0–34.0)
MCHC: 32.9 g/dL (ref 30.0–36.0)
MCV: 90.9 fL (ref 80.0–100.0)
Monocytes Absolute: 0.7 10*3/uL (ref 0.1–1.0)
Monocytes Relative: 11 %
Neutro Abs: 4 10*3/uL (ref 1.7–7.7)
Neutrophils Relative %: 63 %
Platelets: 350 10*3/uL (ref 150–400)
RBC: 5.39 MIL/uL (ref 4.22–5.81)
RDW: 12.4 % (ref 11.5–15.5)
WBC: 6.4 10*3/uL (ref 4.0–10.5)
nRBC: 0 % (ref 0.0–0.2)

## 2023-05-01 LAB — LIPASE, BLOOD: Lipase: 42 U/L (ref 11–51)

## 2023-05-01 LAB — TROPONIN I (HIGH SENSITIVITY): Troponin I (High Sensitivity): 3 ng/L (ref <18)

## 2023-05-01 MED ORDER — HYOSCYAMINE SULFATE 0.125 MG SL SUBL
0.2500 mg | SUBLINGUAL_TABLET | Freq: Once | SUBLINGUAL | Status: AC
Start: 2023-05-01 — End: 2023-05-01
  Administered 2023-05-01: 0.25 mg via SUBLINGUAL
  Filled 2023-05-01: qty 2

## 2023-05-01 MED ORDER — MORPHINE SULFATE (PF) 4 MG/ML IV SOLN
4.0000 mg | Freq: Once | INTRAVENOUS | Status: AC
  Administered 2023-05-01: 4 mg via INTRAVENOUS
  Filled 2023-05-01: qty 1

## 2023-05-01 MED ORDER — ONDANSETRON HCL 4 MG/2ML IJ SOLN
4.0000 mg | Freq: Once | INTRAMUSCULAR | Status: AC
  Administered 2023-05-01: 4 mg via INTRAVENOUS
  Filled 2023-05-01: qty 2

## 2023-05-01 MED ORDER — ALUM & MAG HYDROXIDE-SIMETH 200-200-20 MG/5ML PO SUSP
30.0000 mL | Freq: Once | ORAL | Status: AC
  Administered 2023-05-01: 30 mL via ORAL
  Filled 2023-05-01: qty 30

## 2023-05-01 NOTE — ED Triage Notes (Addendum)
 Pt stated that he began having upper left back pain and abdominal pain after dinner. Pt stated that this has happened about 3 times in the past but this is the longest it has lasted. Pt has partial knee replacement 3 weeks ago

## 2023-05-01 NOTE — ED Provider Notes (Signed)
 [place] EMERGENCY DEPARTMENT AT [facility] Provider Note   CSN: [id number] Arrival date & time: 05/01/23  1934     History  Chief Complaint  Patient presents with   Abdominal Pain   Back Pain    Justin Estrada is a 58 y.o. male patient with past medical history of low back pain, GERD, hypertension, sciatica presenting to emergency room with complaint of left upper abdominal pain that seems to be radiating around to his back.  He reports this occurred approximately 3 hours ago.  Reports it happened 30 minutes after eating meal.  He denies any nausea vomiting diarrhea chest pain or shortness of breath.  He reports that he has had this happen several times after eating which will last approximately 30 minutes and resolve on its own.  He reported he came to emergency room after trying Tums and it did not go away like he expected.  He has no past abdominal surgery.  He has been taking omeprazole for GERD.   Abdominal Pain Back Pain Associated symptoms: abdominal pain        Home Medications Prior to Admission medications   Medication Sig Start Date End Date Taking? Authorizing Provider  diltiazem 2 % GEL Apply 1 Application topically 2 (two) times daily. 02/24/23   Dolores Frame, MD  docusate sodium (COLACE) 100 MG capsule Take 1 capsule (100 mg total) by mouth 2 (two) times daily. 09/30/17   Lucretia Roers, MD  lisinopril (ZESTRIL) 10 MG tablet Take 10 mg by mouth daily.    [provider]  pregabalin (LYRICA) 50 MG capsule Take 1 in the morning and take 2 in the evening 03/19/23   Luking, Scott A, MD  psyllium (METAMUCIL SMOOTH TEXTURE) 28 % packet Take 1 packet by mouth at bedtime. 04/20/12   Malissa Hippo, MD      Allergies    Patient has no known allergies.    Review of Systems   Review of Systems  Gastrointestinal:  Positive for abdominal pain.  Musculoskeletal:  Positive for back pain.    Physical Exam Updated Vital Signs BP (!)  157/77   Pulse 65   Temp 97.8 F (36.6 C) (Oral)   Resp 18   Ht 5\' 11"  (1.803 m)   Wt 113.4 kg   SpO2 100%   BMI 34.87 kg/m  Physical Exam Vitals and nursing note reviewed.  Constitutional:      General: He is not in acute distress.    Appearance: He is not toxic-appearing.     Comments: No acute distress.   HENT:     Head: Normocephalic and atraumatic.  Eyes:     General: No scleral icterus.    Conjunctiva/sclera: Conjunctivae normal.  Cardiovascular:     Rate and Rhythm: Normal rate and regular rhythm.     Pulses: Normal pulses.     Heart sounds: Normal heart sounds.  Pulmonary:     Effort: Pulmonary effort is normal. No respiratory distress.     Breath sounds: Normal breath sounds.  Abdominal:     General: Abdomen is flat. Bowel sounds are normal.     Palpations: Abdomen is soft.     Tenderness: There is abdominal tenderness. There is no right CVA tenderness or left CVA tenderness.     Comments: Mild tenderness to palpation in left upper quadrant.  He locates pain between scapula however is not reproducible on palpation.  No rash on exam.  No significant hernia.  Musculoskeletal:     Right lower leg: No edema.     Left lower leg: No edema.     Comments: Radial pulse equal bilaterally.   Skin:    General: Skin is warm and dry.     Findings: No lesion.  Neurological:     General: No focal deficit present.     Mental Status: He is alert and oriented to person, place, and time. Mental status is at baseline.     Cranial Nerves: No cranial nerve deficit.     Sensory: No sensory deficit.     Motor: No weakness.     Coordination: Coordination normal.     Gait: Gait normal.     ED Results / Procedures / Treatments   Labs (all labs ordered are listed, but only abnormal results are displayed) Labs Reviewed  BASIC METABOLIC PANEL - Abnormal; Notable for the following components:      Result Value   Glucose, Bld 142 (*)    All other components within normal limits   CBC WITH DIFFERENTIAL/PLATELET  LIPASE, BLOOD  TROPONIN I (HIGH SENSITIVITY)    EKG EKG Interpretation Date/Time:  Friday May 01 2023 22:37:56 EST Ventricular Rate:  56 PR Interval:  165 QRS Duration:  113 QT Interval:  404 QTC Calculation: 390 R Axis:   53  Text Interpretation: Sinus rhythm Borderline intraventricular conduction delay Confirmed by Eber Hong ([id number]) on 05/01/2023 10:40:27 PM  Radiology No results found.  Procedures Procedures    Medications Ordered in ED Medications  alum & mag hydroxide-simeth (MAALOX/MYLANTA) 200-200-20 MG/5ML suspension 30 mL (30 mLs Oral Given 05/01/23 2156)  hyoscyamine (LEVSIN SL) SL tablet 0.25 mg (0.25 mg Sublingual Given 05/01/23 2156)  morphine (PF) 4 MG/ML injection 4 mg (4 mg Intravenous Given 05/01/23 2246)  ondansetron (ZOFRAN) injection 4 mg (4 mg Intravenous Given 05/01/23 2245)    ED Course/ Medical Decision Making/ A&P                                 Medical Decision Making Amount and/or Complexity of Data Reviewed Labs: ordered. Radiology: ordered.  Risk OTC drugs. Prescription drug management.   [name] 58 y.o. presented today for abd pain. Working DDx includes, but not limited to, gastroenteritis, colitis, SBO, appendicitis, cholecystitis, hepatobiliary pathology, gastritis, PUD, ACS, dissection, pancreatitis, nephrolithiasis, AAA, UTI, pyelonephritis, torison.  R/o DDx: These are considered less likely than current impression due to history of present illness, physical exam, labs/imaging findings.  Pmhx: GERD  Unique Tests and My Interpretation:  EKG shows sinus arrhythmia.  CBC without significant leukocytosis and no anemia.  BMP without electrolyte abnormality.  Lipase 42.  For troponin pending.  Imaging:   CT Renal Study: due to favored nephrolithiasis over GI etiology -- pending  Problem List / ED Course / Critical interventions / Medication management  Patient reporting epigastric pain and  left-sided flank pain.  He has had symptoms like this before.  This occurred 30 minutes after eating.  He is in no acute distress.  He does have mild reproducible pain in epigastric area.  No rash on physical exam.  Abdomen soft and nondistended.  He has no CVA tenderness no urinary symptoms.  Lungs are clear to auscultation bilaterally.  He denies any chest pain or shortness of breath.  Labs without significant abnormality.  I obtained CT renal study to rule out kidney stone given flank pain.  Getting EKG  and troponin to rule out atypical ACS presentation.  Given GI cocktail as I am suspicious there is a GERD component. Reassessed patient he continues to have mild discomfort.  Will give morphine and Zofran to better control pain.  Still waiting on CT imaging.   When reassessed GI cocktail had mild improvement still having pain will give morphine and Zofran. I ordered medication including GI cocktail, morphine, zofran  Reevaluation of the patient after these medicines showed that the patient improved Patients vitals assessed. Upon arrival patient is  hemodynamically stable.  I have reviewed the patients home medicines and have made adjustments as needed   Plan: Dr Pilar Plate sign out. Dipso pending imaging and reassessment, but feel patient liekly stable for d/c         Final Clinical Impression(s) / ED Diagnoses Final diagnoses:  LUQ abdominal pain    Rx / DC Orders ED Discharge Orders     None         Smitty Knudsen, PA-C 05/04/23 1610    Eber Hong, MD 05/05/23 [phone]

## 2023-05-01 NOTE — ED Provider Notes (Signed)
  Provider Note MRN:  [id number]  Arrival date & time: 05/02/23    ED Course and Medical Decision Making  Assumed care of patient at sign-out or upon transfer.  Left upper quadrant abdominal pain with radiation to the back awaiting CT imaging.  Pain is mild, vitals reassuring, anticipating discharge if workup without emergent process.  12:15 AM update: CT without evidence of kidney stone or emergent process.  There is evidence of gallstones.  Patient feeling well on my evaluation with normal vital signs.  His pain was all on the left side and so I am favoring that these gallstones are more of an incidental finding.  Will provide him with the appropriate follow-up, no emergent process, appropriate for discharge.  Procedures  Final Clinical Impressions(s) / ED Diagnoses     ICD-10-CM   1. LUQ abdominal pain  R10.12       ED Discharge Orders          Ordered    sucralfate (CARAFATE) 1 g tablet  4 times daily PRN        05/02/23 0018              Discharge Instructions      You were evaluated in the Emergency Department and after careful evaluation, we did not find any emergent condition requiring admission or further testing in the hospital.  Your exam/testing today is overall reassuring.  Your CT scan showed evidence of stones in the gallbladder but based on your symptoms we suspect this is more of an incidental finding.  Suspect your symptoms were related to stomach acid.  Continue your daily preventative medicine at home such as over-the-counter Nexium or Prilosec.  Can use the prescribed Carafate medication as needed for more immediate relief.  Follow-up with your GI doctor and also the general surgeons regarding the gallbladder findings.  Please return to the Emergency Department if you experience any worsening of your condition.   Thank you for allowing [place] to be a part of your care.      Elmer Sow. Pilar Plate, MD [facility] Emergency [facility]  Health [email or web address]    Sabas Sous, MD 05/02/23 [phone]

## 2023-05-02 MED ORDER — SUCRALFATE 1 G PO TABS: 1.0000 g | ORAL_TABLET | Freq: Four times a day (QID) | ORAL | 0 refills | Status: DC | PRN

## 2023-05-02 NOTE — Discharge Instructions (Addendum)
 You were evaluated in the Emergency Department and after careful evaluation, we did not find any emergent condition requiring admission or further testing in the hospital.  Your exam/testing today is overall reassuring.  Your CT scan showed evidence of stones in the gallbladder but based on your symptoms we suspect this is more of an incidental finding.  Suspect your symptoms were related to stomach acid.  Continue your daily preventative medicine at home such as over-the-counter Nexium or Prilosec.  Can use the prescribed Carafate medication as needed for more immediate relief.  Follow-up with your GI doctor and also the general surgeons regarding the gallbladder findings.  Please return to the Emergency Department if you experience any worsening of your condition.   Thank you for allowing [place] to be a part of your care.

## 2023-05-05 ENCOUNTER — Ambulatory Visit: Payer: Self-pay | Admitting: Family Medicine

## 2023-05-05 NOTE — Telephone Encounter (Signed)
 Chief Complaint: cough Symptoms: cough, green/yellow sputum, runny eyes, runny nose, congestion Frequency: Since Sunday Pertinent Negatives: Patient denies difficulty breathing, CP, fever, N/V Disposition: [] ED /[] Urgent Care (no appt availability in office) / [x] Appointment(In office/virtual)/ []  [place] [facility]/ [] Home Care/ [] Refused Recommended Disposition /[] [place] [facility]/ []  Follow-up with PCP Additional Notes: Pt reports cough, congestion, runny nose, watery eyes since Sunday. Pt states his sputum is now green/yellow. Denies difficulty breathing, CP, fever, N/V. Per protocol pt scheduled in office at 1020 tomorrow 3/12. Pt agreeable to that plan. RN advised pt if he worsens before then to give [place] another call to which he verbalized understanding.   Copied from CRM [id number]. Topic: Clinical - Red Word Triage >> May 05, 2023  8:26 AM Desma Mcgregor wrote: Red Word that prompted transfer to Nurse Triage: Coughing up mucous (greenish yellow) and lots of chest congestion, runny nose, cough, and watery eyes. Reason for Disposition  SEVERE coughing spells (e.g., whooping sound after coughing, vomiting after coughing)  Answer Assessment - Initial Assessment Questions 1. ONSET: "When did the cough begin?"      Sunday AM 2. SEVERITY: "How bad is the cough today?"      "It's not severe", "dry tickle cough", "I have coughed up stuff today" 3. SPUTUM: "Describe the color of your sputum" (none, dry cough; clear, white, yellow, green)     Green/yellow 4. HEMOPTYSIS: "Are you coughing up any blood?" If so ask: "How much?" (flecks, streaks, tablespoons, etc.)     No 5. DIFFICULTY BREATHING: "Are you having difficulty breathing?" If Yes, ask: "How bad is it?" (e.g., mild, moderate, severe)    - MILD: No SOB at rest, mild SOB with walking, speaks normally in sentences, can lie down, no retractions, pulse < 100.    - MODERATE: SOB at rest, SOB with minimal exertion and prefers to sit, cannot  lie down flat, speaks in phrases, mild retractions, audible wheezing, pulse 100-120.    - SEVERE: Very SOB at rest, speaks in single words, struggling to breathe, sitting hunched forward, retractions, pulse > 120      No 6. FEVER: "Do you have a fever?" If Yes, ask: "What is your temperature, how was it measured, and when did it start?"     No 7. CARDIAC HISTORY: "Do you have any history of heart disease?" (e.g., heart attack, congestive heart failure)      HTN 8. LUNG HISTORY: "Do you have any history of lung disease?"  (e.g., pulmonary embolus, asthma, emphysema)     No 9. PE RISK FACTORS: "Do you have a history of blood clots?" (or: recent major surgery, recent prolonged travel, bedridden)     No 10. OTHER SYMPTOMS: "Do you have any other symptoms?" (e.g., runny nose, wheezing, chest pain)       Runny nose, watery eyes  Protocols used: Cough - Acute Productive-A-AH

## 2023-05-05 NOTE — Telephone Encounter (Signed)
 Noted.

## 2023-05-06 ENCOUNTER — Ambulatory Visit: Admitting: Family Medicine

## 2023-05-06 VITALS — BP 137/85 | HR 100 | Temp 97.0°F | Ht 71.0 in | Wt 255.4 lb

## 2023-05-06 DIAGNOSIS — E785 Hyperlipidemia, unspecified: Secondary | ICD-10-CM

## 2023-05-06 DIAGNOSIS — K8020 Calculus of gallbladder without cholecystitis without obstruction: Secondary | ICD-10-CM | POA: Diagnosis not present

## 2023-05-06 DIAGNOSIS — I10 Essential (primary) hypertension: Secondary | ICD-10-CM

## 2023-05-06 DIAGNOSIS — J019 Acute sinusitis, unspecified: Secondary | ICD-10-CM | POA: Diagnosis not present

## 2023-05-06 MED ORDER — HYDROCODONE BIT-HOMATROP MBR 5-1.5 MG/5ML PO SOLN: ORAL | 0 refills | Status: DC

## 2023-05-06 NOTE — Progress Notes (Signed)
   Subjective:    Patient ID: Justin Estrada, male    DOB: Dec 27, 1965, 58 y.o.   MRN: [id number]  Discussed the use of AI scribe software for clinical note transcription with the patient, who gave verbal consent to proceed.  History of Present Illness   The patient presents with upper respiratory symptoms and recent left-sided abdominal pain.  He has been experiencing upper respiratory symptoms that began last week. Symptoms include throat irritation, runny nose, watery eyes, sneezing, and a dry cough with minimal green sputum production. No fever, body aches, or flu-like symptoms are present.  Approximately five to six days ago, he visited the emergency department due to left-sided abdominal pain. The pain started on the left side, radiating to the middle of the back and stomach, and was persistent. He has experienced similar pain twice before, but this time it was accompanied by stomach pain. Blood work, including a troponin test, was negative for heart-related issues. A CT scan revealed gallstones, but no kidney stones. He has a history of gallstones and has had ultrasounds in the past to monitor gallbladder polyps. No current pain with eating, nausea, or vomiting.  He is currently undergoing physical therapy and reports good progress with range of motion, which was measured at 112 degrees on February 26th. Initially, he experienced pain post-surgery but managed it with medication for about a week, primarily at night. He reports improvement in balance and healing of the surgical scar. He is scheduled for one more therapy session and plans to follow up on March 18th.         Review of Systems     Objective:    Physical Exam   HEENT: Ears normal.   Lungs clear respiratory rate normal heart regular pulse normal       Assessment & Plan:  Assessment and Plan    Upper respiratory infection Symptoms suggest viral upper respiratory infection. No fever or myalgia. Expected resolution in 4-5  days. - Provisional antibiotic prescription if symptoms worsen. - Prescribe cough syrup for nighttime use.  Post-operative recovery from knee surgery Recovering well with improved range of motion at 112 degrees. Pain resolved. Surgical scar healing well. - Continue physical therapy three times a week for four weeks. - Follow up with surgeon on March 18 for evaluation and activity guidance.  Gallstones Gallstones confirmed by CT. Asymptomatic. Advised against surgery unless symptomatic. - Message Dr. Levon Hedger to discuss recent ER visit and follow-up ultrasound need. - Monitor for symptoms indicating gallstone complications.     More than likely a viral illness with some secondary rhinosinusitis but I think there is a reasonable chance this will get better on its own without the antibiotics I did discuss with the patient utilizing antibiotics if not improving over the next 5 to 6 days  Patient will do blood work before his follow-up visit coming up  We will reach out to Dr. Levon Hedger regarding asymptomatic gallstones

## 2023-05-11 ENCOUNTER — Encounter: Payer: Self-pay | Admitting: Family Medicine

## 2023-05-11 ENCOUNTER — Telehealth: Payer: Self-pay | Admitting: Family Medicine

## 2023-05-11 NOTE — Telephone Encounter (Signed)
 Patient with gallstones on recent ultrasound.  I did connect with his gastroenterologist Dr. Levon Hedger.  They did not recommend any further testing currently.  Patient has been made aware. They do recommend a follow-up ultrasound in 2 years Please put into the reminder file to do follow-up ultrasound in 2 years thank you

## 2023-05-14 ENCOUNTER — Encounter: Payer: Self-pay | Admitting: Family Medicine

## 2023-05-14 LAB — BASIC METABOLIC PANEL
BUN/Creatinine Ratio: 10 (ref 9–20)
BUN: 13 mg/dL (ref 6–24)
CO2: 23 mmol/L (ref 20–29)
Calcium: 9.2 mg/dL (ref 8.7–10.2)
Chloride: 105 mmol/L (ref 96–106)
Creatinine, Ser: 1.26 mg/dL (ref 0.76–1.27)
Glucose: 107 mg/dL — ABNORMAL HIGH (ref 70–99)
Potassium: 4.6 mmol/L (ref 3.5–5.2)
Sodium: 142 mmol/L (ref 134–144)
eGFR: 67 mL/min/{1.73_m2} (ref >59)

## 2023-05-14 LAB — LIPID PANEL
Chol/HDL Ratio: 4.3 ratio (ref 0.0–5.0)
Cholesterol, Total: 149 mg/dL (ref 100–199)
HDL: 35 mg/dL — ABNORMAL LOW (ref >39)
LDL Chol Calc (NIH): 90 mg/dL (ref 0–99)
Triglycerides: 137 mg/dL (ref 0–149)
VLDL Cholesterol Cal: 24 mg/dL (ref 5–40)

## 2023-05-14 LAB — MICROALBUMIN / CREATININE URINE RATIO
Creatinine, Urine: 220.6 mg/dL
Microalb/Creat Ratio: 2 mg/g{creat} (ref 0–29)
Microalbumin, Urine: 4.2 ug/mL

## 2023-05-14 LAB — HEPATIC FUNCTION PANEL
ALT: 28 IU/L (ref 0–44)
AST: 18 IU/L (ref 0–40)
Albumin: 4.4 g/dL (ref 3.8–4.9)
Alkaline Phosphatase: 103 IU/L (ref 44–121)
Bilirubin Total: 0.6 mg/dL (ref 0.0–1.2)
Bilirubin, Direct: 0.2 mg/dL (ref 0.00–0.40)
Total Protein: 6.7 g/dL (ref 6.0–8.5)

## 2023-05-20 ENCOUNTER — Telehealth: Payer: Self-pay | Admitting: Family Medicine

## 2023-05-20 ENCOUNTER — Encounter: Payer: Self-pay | Admitting: Family Medicine

## 2023-05-20 ENCOUNTER — Ambulatory Visit: Payer: BC Managed Care – PPO | Admitting: Family Medicine

## 2023-05-20 VITALS — BP 115/75 | HR 69 | Temp 98.2°F | Ht 71.0 in | Wt 259.0 lb

## 2023-05-20 DIAGNOSIS — M25512 Pain in left shoulder: Secondary | ICD-10-CM | POA: Diagnosis not present

## 2023-05-20 DIAGNOSIS — K219 Gastro-esophageal reflux disease without esophagitis: Secondary | ICD-10-CM | POA: Diagnosis not present

## 2023-05-20 MED ORDER — ESOMEPRAZOLE MAGNESIUM 40 MG PO CPDR: 40.0000 mg | DELAYED_RELEASE_CAPSULE | Freq: Every day | ORAL | 2 refills | Status: DC

## 2023-05-20 MED ORDER — VALSARTAN 80 MG PO TABS: 80.0000 mg | ORAL_TABLET | Freq: Every day | ORAL | 1 refills | Status: DC

## 2023-05-20 MED ORDER — LISINOPRIL 10 MG PO TABS: 10.0000 mg | ORAL_TABLET | Freq: Every day | ORAL | 1 refills | Status: DC

## 2023-05-20 MED ORDER — SUCRALFATE 1 G PO TABS: 1.0000 g | ORAL_TABLET | Freq: Four times a day (QID) | ORAL | 0 refills | Status: DC | PRN

## 2023-05-20 MED ORDER — PREGABALIN 50 MG PO CAPS: ORAL_CAPSULE | ORAL | 4 refills | Status: DC

## 2023-05-20 NOTE — Progress Notes (Signed)
 Subjective:    Patient ID: Justin Estrada, male    DOB: 12-09-1965, 58 y.o.   MRN: [id number]  HPI follow up   Acid reflux becoming more frequent Discussed the use of AI scribe software for clinical note transcription with the patient, who gave verbal consent to proceed.  History of Present Illness   Justin Estrada "Justin Estrada" is a 58 year old male who presents with worsening regurgitation symptoms.  He experiences a 'silent, hot burning' sensation of regurgitation, particularly when reclining, and bloating after meals. These symptoms have intensified recently, with a significant episode on Monday characterized by nausea and bloating throughout the day. He uses sucralfate as needed but sometimes forgets to take it four times a day as prescribed. He recalls a past prescription of pantoprazole that was effective. No nocturnal discomfort or dysphagia.  He has been experiencing left shoulder pain for the past three months, which has been gradually worsening. He describes a dull ache with poor range of motion and pain radiating into the bone. He has not sought medical attention for this issue yet. Some days he can hardly lift his arm, and certain movements exacerbate the pain.  He is recovering from knee replacement surgery and is on light duty at work. He has been advised to balance standing and sitting throughout the day and to gradually increase walking. He reports good range of motion in the knee, with measurements improving from 115 degrees at two weeks post-op to 123 degrees at five weeks. He experiences some swelling and numbness in the knee area, particularly when on the floor.  He continues to take pregabalin (Lyrica) for neck pain, which has gradually worsened over the past year. He experiences a dull ache in the muscles of his back, particularly when sitting in a recliner, but notes some improvement recently. No significant side effects from pregabalin, such as drowsiness.         Review of  Systems     Objective:   Physical Exam General-in no acute distress Eyes-no discharge Lungs-respiratory rate normal, CTA CV-no murmurs,RRR Extremities skin warm dry no edema Neuro grossly normal Behavior normal, alert        Assessment & Plan:  Assessment and Plan    Gastroesophageal Reflux Disease (GERD) Worsening GERD symptoms with nausea, bloating, and regurgitation. Sucralfate ineffective long-term. Discussed pantoprazole for acid reduction. Advised dietary modifications and potential further evaluation if symptoms persist. - Prescribe nexium for 30 days with a refill, one daily. - Advise taking sucralfate three times a day for the first week. - Instruct to avoid spicy and tomato-based foods. - Advise elevating the head of the bed by six inches. - Consider further evaluation, including endoscopy, if symptoms persist after two weeks.  Left Shoulder Pain Worsening shoulder pain with limited range of motion. Suspected AC joint arthritis. Advised against NSAIDs due to GERD. Tylenol considered safe. Discussed potential further evaluation if symptoms worsen. - Recommend Tylenol for pain management. - Advise gentle range of motion exercises. - Consider referral to orthopedics for further evaluation if symptoms worsen over the next three months.  Cervical Spine and Neck Pain Intermittent neck pain with some improvement. Suspected nerve impingement. Discussed reassessment during July physical. - Continue pregabalin as prescribed. - Reassess neck pain and medication use during the July physical.  Knee Replacement Recovery Recovering from left knee replacement with good range of motion. Expected post-surgery numbness. - Continue gradual increase in walking and activity as tolerated. - Avoid excessive standing or sitting.  General Health Maintenance Due for July physical with routine labs. Recent labs show good kidney function and cholesterol levels, but LDL could improve.  Advised dietary measures. - Order lab work, including PSA, hepatitis C, and HIV tests, prior to July physical. - Advise dietary measures to improve LDL cholesterol, including increased fruits, vegetables, and lean meats.  Follow-up Follow up in two weeks to assess GERD treatment effectiveness. - Follow up in two weeks to assess GERD treatment effectiveness.      1. Left shoulder pain, unspecified chronicity (Primary) More than likely developed some osteoarthritis of the [facility] joint and the left shoulder.  I doubt that this is coming from his cervical spine disease but certainly if he gets worse over the next 6 to 8 weeks we will get him in with orthopedics  2. Gastroesophageflux disease without esophagitis Nexium and Carafate this should gradually get things doing better I recommend Nexium on a daily basis for the next 30 days with 1 refill I recommend Carafate 3 times daily for at least the next 7 to 10 days Put head of bed on 6 inch block Give [place] feedback within 2 weeks how things are going   Patient called back later stating that pharmacy dispensed lisinopril.  Inadvertently this was sent.  Patient is no longer on lisinopril.  Somehow the computer med list had him listed as lisinopril.  We will call [facility] and cancel lisinopril from his med list Continue valsartan Patient has standard follow-up coming up for a wellness visit

## 2023-05-20 NOTE — Telephone Encounter (Signed)
 Nurses I spoke with patient via phone He relates that he received a prescription for lisinopril from the pharmacy He is no longer on lisinopril He is on valsartan I agree sent the valsartan Please call [facility].  Let them know that he is no longer on lisinopril and asked them to take that off of his active med list He should be listed as valsartan 80 mg 1 daily If any questions let me know thank you Patient is aware we are doing this

## 2023-05-21 ENCOUNTER — Other Ambulatory Visit: Payer: Self-pay

## 2023-05-21 DIAGNOSIS — Z125 Encounter for screening for malignant neoplasm of prostate: Secondary | ICD-10-CM

## 2023-05-21 DIAGNOSIS — E785 Hyperlipidemia, unspecified: Secondary | ICD-10-CM

## 2023-05-21 DIAGNOSIS — I10 Essential (primary) hypertension: Secondary | ICD-10-CM

## 2023-05-24 ENCOUNTER — Encounter: Payer: Self-pay | Admitting: Family Medicine

## 2023-05-25 ENCOUNTER — Other Ambulatory Visit: Payer: Self-pay

## 2023-05-25 DIAGNOSIS — R1013 Epigastric pain: Secondary | ICD-10-CM

## 2023-05-25 DIAGNOSIS — R1010 Upper abdominal pain, unspecified: Secondary | ICD-10-CM

## 2023-05-25 NOTE — Telephone Encounter (Signed)
 Nurses I read through Justin Estrada's message  Given his symptoms it would be reasonable to do the following 1.  Up-to-date ultrasound of right upper quadrant Reason epigastric discomfort 2.  Set up HIDA test This will evaluate gallbladder ejection fraction efficiency Reason-upper abdominal pain

## 2023-06-03 ENCOUNTER — Encounter: Payer: Self-pay | Admitting: Family Medicine

## 2023-06-08 ENCOUNTER — Encounter (HOSPITAL_COMMUNITY)
Admission: RE | Admit: 2023-06-08 | Discharge: 2023-06-08 | Disposition: A | Discharge status: Home / Self Care | Source: Ambulatory Visit | Attending: Family Medicine | Admitting: Family Medicine

## 2023-06-08 ENCOUNTER — Ambulatory Visit (HOSPITAL_COMMUNITY)
Admission: RE | Admit: 2023-06-08 | Discharge: 2023-06-08 | Disposition: A | Discharge status: Home / Self Care | Source: Ambulatory Visit | Attending: Family Medicine | Admitting: Family Medicine

## 2023-06-08 ENCOUNTER — Encounter: Payer: Self-pay | Admitting: Family Medicine

## 2023-06-08 DIAGNOSIS — R1010 Upper abdominal pain, unspecified: Secondary | ICD-10-CM | POA: Diagnosis present

## 2023-06-08 DIAGNOSIS — R1013 Epigastric pain: Secondary | ICD-10-CM | POA: Diagnosis present

## 2023-06-08 MED ORDER — TECHNETIUM TC 99M MEBROFENIN IV KIT
5.3400 | PACK | Freq: Once | INTRAVENOUS | Status: AC | PRN
  Administered 2023-06-08: 5.34 via INTRAVENOUS

## 2023-06-09 ENCOUNTER — Encounter: Payer: Self-pay | Admitting: Family Medicine

## 2023-06-09 ENCOUNTER — Other Ambulatory Visit: Payer: Self-pay

## 2023-06-09 MED ORDER — PREGABALIN 50 MG PO CAPS: ORAL_CAPSULE | ORAL | 4 refills | Status: DC

## 2023-06-12 ENCOUNTER — Encounter: Payer: Self-pay | Admitting: Family Medicine

## 2023-06-14 ENCOUNTER — Telehealth: Payer: Self-pay | Admitting: Family Medicine

## 2023-06-14 NOTE — Telephone Encounter (Signed)
 Front staff Please set up patient to follow-up with me in a same-day slot because of side pain within the abdomen thank you  This can be this week Tuesday or Wednesday what ever he is able to do If he cannot do this week then next week

## 2023-06-16 ENCOUNTER — Ambulatory Visit: Admitting: Family Medicine

## 2023-06-16 VITALS — BP 134/85 | HR 67 | Temp 98.4°F | Ht 71.0 in | Wt 259.0 lb

## 2023-06-16 DIAGNOSIS — M7918 Myalgia, other site: Secondary | ICD-10-CM

## 2023-06-16 NOTE — Progress Notes (Signed)
   Subjective:    Patient ID: Justin Estrada, male    DOB: 1965-07-18, 58 y.o.   MRN: [id number]  HPI Pt comes in today for follow up on side pain. Pt states the pain is still dull with certain movements and positions but not excruciating. Patient relates that this pain sort of comes and goes its on the left lower abdomen on the left side.  There is no nausea vomiting diarrhea associated with it no blood in the stools.  He has had a recent CT scan and also had his recent ultrasound and HIDA test None of these point toward underlying cause of this issue Patient states when this comes on it is more so when he is getting in and out of a car or making certain movements.  Medications and allergies reviewed.    Review of Systems     Objective:   Physical Exam  General-in no acute distress Eyes-no discharge Lungs-respiratory rate normal, CTA CV-no murmurs,RRR Extremities skin warm dry no edema Neuro grossly normal Behavior normal, alert Abdomen is soft no guarding rebound or tenderness      Assessment & Plan:  Musculoskeletal pain on the left side of the abdomen no associated symptoms monitor closely certainly if worrisome issues such as vomiting diarrhea or bloody stools to notify us  otherwise stretching exercises to do lab work in June and a follow-up wellness visit this summer follow-up sooner problems

## 2023-07-13 ENCOUNTER — Encounter: Payer: Self-pay | Admitting: Family Medicine

## 2023-07-15 NOTE — Telephone Encounter (Signed)
 Nurses Please assist Todd with setting up a visit with me either this week or next week-should be able to work him into the schedule late afternoon which would work well with his job  Please help set this up and notify [employer]. Geralyn Knee

## 2023-07-24 ENCOUNTER — Ambulatory Visit: Admitting: Family Medicine

## 2023-07-24 VITALS — BP 121/77 | HR 65 | Temp 98.2°F | Ht 71.0 in | Wt 252.8 lb

## 2023-07-24 DIAGNOSIS — M5414 Radiculopathy, thoracic region: Secondary | ICD-10-CM | POA: Diagnosis not present

## 2023-07-24 NOTE — Progress Notes (Signed)
 Subjective:    Patient ID: Justin Estrada, male    DOB: 17-Apr-1965, 58 y.o.   MRN: [id number]  HPI Discussed the use of AI scribe software for clinical note transcription with the patient, who gave verbal consent to proceed.  History of Present Illness   Justin VANDAM "Ena Harries" is a 58 year old male with chronic back pain who presents with fluctuating lower back pain.  He experiences chronic lower back pain that varies in intensity, located across the lower back and sometimes radiating to the muscles and other areas. The pain is particularly severe when sitting or after leaning back, and can feel sore to the touch or like a muscle pull. Walking does not exacerbate the pain, but sitting or changing positions can trigger it. A recent episode occurred after sitting on the kitchen floor and leaning back repeatedly. Adjusting his car seat to avoid awkward bending has provided some relief.  An MRI of the lower back in November 2023 revealed a small cyst near the spinal cord. He has no issues with bowel movements or urination, although he sometimes urinates after a bowel movement. No current heart problems are noted.  He received a corticosteroid injection for shoulder pain, which provided relief for about two weeks. The shoulder pain has started to return but is not as severe as before.  He is planning to retire in December 2026 and currently works in education. He has a son pursuing IT studies and a girlfriend training to be a Occupational hygienist.     Patient is having a lot of left-sided chest pain he also has a lot of left flank pain left side abdominal pain shooting pains stabbing pain uncomfortable.  Hurts with certain movements.  Denies any particular injuries.  Relates the medication helps to some degree but the pain is fairly persistent and worries him.  At times the pain radiates into the left chest other times left scapula sometimes left flank-this has been going on for months we have tried multiple  conservative approaches and also even more underwent extensive cardiac testing to rule out coronary artery disease   Review of Systems     Objective:   Physical Exam  General-in no acute distress Eyes-no discharge Lungs-respiratory rate normal, CTA CV-no murmurs,RRR Extremities skin warm dry no edema Neuro grossly normal Behavior normal, alert Subjective discomfort left side of chest flank back mid back lower back      Assessment & Plan:  1. Thoracic nerve root impingement (Primary) I believe it is reasonable for him to do a thoracic MRI Currently taking Lyrica  tolerating this well Helps to some degree but does not help on a daily basis Patient having enough pain and discomfort to lessen his quality of life and limiting some of his physical activity Patient will do labs before his wellness visit in the summer HTN stable continue current measures  Assessment and Plan    Degenerative disc disease with facet arthritis Chronic lower back pain due to degenerative changes at L4-L5, moderate bilateral facet arthritis, and bulging disc. Pain likely from nerve impingement by spondylolisthesis and facet arthritis. No surgery needed. - Encouraged stretching and gentle exercises. - Advised avoiding heavy lifting and high-impact activities. - Ordered MRI of thoracic spine to evaluate left-sided pain.  Spondylolisthesis Mild spondylolisthesis contributing to lower back pain with potential nerve impingement. No surgery needed. - Encouraged stretching and gentle exercises. - Advised avoiding heavy lifting and high-impact activities. - Scheduled MRI of thoracic spine for further evaluation.  Shoulder arthritis with bone spur Chronic shoulder pain due to arthritis and bone spur. Recent corticosteroid injection provided temporary relief. Pain manageable.       - MR THORACIC SPINE W WO CONTRAST; Future

## 2023-07-27 ENCOUNTER — Encounter: Payer: Self-pay | Admitting: Family Medicine

## 2023-08-01 ENCOUNTER — Ambulatory Visit (HOSPITAL_COMMUNITY)
Admission: RE | Admit: 2023-08-01 | Discharge: 2023-08-01 | Disposition: A | Discharge status: Home / Self Care | Source: Ambulatory Visit | Attending: Family Medicine | Admitting: Family Medicine

## 2023-08-01 DIAGNOSIS — M5414 Radiculopathy, thoracic region: Secondary | ICD-10-CM | POA: Diagnosis present

## 2023-08-01 MED ORDER — GADOBUTROL 1 MMOL/ML IV SOLN
10.0000 mL | Freq: Once | INTRAVENOUS | Status: AC | PRN
  Administered 2023-08-01: 10 mL via INTRAVENOUS

## 2023-08-10 ENCOUNTER — Ambulatory Visit: Payer: Self-pay | Admitting: Podiatry

## 2023-08-10 ENCOUNTER — Encounter: Payer: Self-pay | Admitting: Podiatry

## 2023-08-10 ENCOUNTER — Encounter: Payer: Self-pay | Admitting: Family Medicine

## 2023-08-10 VITALS — Ht 71.0 in | Wt 253.0 lb

## 2023-08-10 DIAGNOSIS — G629 Polyneuropathy, unspecified: Secondary | ICD-10-CM

## 2023-08-10 DIAGNOSIS — M722 Plantar fascial fibromatosis: Secondary | ICD-10-CM

## 2023-08-10 DIAGNOSIS — M7671 Peroneal tendinitis, right leg: Secondary | ICD-10-CM

## 2023-08-10 MED ORDER — TRIAMCINOLONE ACETONIDE 10 MG/ML IJ SUSP: 10.0000 mg | Freq: Once | INTRAMUSCULAR | Status: AC

## 2023-08-10 NOTE — Progress Notes (Signed)
 Subjective:   Patient ID: Justin Estrada, male   DOB: 58 y.o.   MRN: [id number]   HPI Patient states he is developing a lot of pain right over left and its on the bottom and is also on the side.  States the bottom is worse also continuing to complain of burning pain but it is not gotten worse and the gabapentin  seems to help.   ROS      Objective:  Physical Exam  Neurovascular status intact significant depression of the arch bilateral forefoot vague pain bilateral exquisite discomfort in the plantar fascia at insertion right and inflammation pain of the peroneal insertion right     Assessment:  Acute plantar fasciitis right peroneal tendinitis right with neuropathic like condition     Plan:  H&P x-ray reviewed sterile prep injected the plantar fascia right 3 mg Kenalog  5 mg Xylocaine  advised that we may have to work on the outside of the foot and then also pedorthist casted for functional orthotic devices to lift up the arch properly and will be seen back when returned  X-rays indicate small plantar spur no indication of stress fracture noted

## 2023-08-21 ENCOUNTER — Encounter: Payer: Self-pay | Admitting: Family Medicine

## 2023-08-21 ENCOUNTER — Ambulatory Visit: Admitting: Family Medicine

## 2023-08-21 VITALS — BP 144/85 | HR 60 | Temp 97.5°F | Ht 71.0 in | Wt 256.0 lb

## 2023-08-21 DIAGNOSIS — J392 Other diseases of pharynx: Secondary | ICD-10-CM | POA: Diagnosis not present

## 2023-08-21 MED ORDER — FAMOTIDINE 40 MG PO TABS: 40.0000 mg | ORAL_TABLET | Freq: Every day | ORAL | 5 refills | Status: DC

## 2023-08-21 NOTE — Progress Notes (Signed)
   Subjective:    Patient ID: Justin Estrada, male    DOB: 29-Jul-1965, 58 y.o.   MRN: [id number]  HPI Scratchy throat for several weeks no other symptoms  Occurring all day on and off Ha sno trouble swallowing, slight cough w/ some drinks Discussed the use of AI scribe software for clinical note transcription with the patient, who gave verbal consent to proceed.  History of Present Illness   Justin Estrada is a 58 year old male who presents with a persistent scratchy throat.  Pharyngeal irritation and throat symptoms - Persistent scratchy sensation in the throat for four weeks - Sensation is different but not sore - Occasional dry sensation attributed to decreased saliva production - No difficulty swallowing or sensation of food impaction - No fever, significant nasal discharge, or voice changes - Brief episode of chills on Tuesday night - Occasional expectoration of small amounts of mucus  Nocturnal throat burning and regurgitation - Mild burning sensation in the throat at night - Symptoms associated with regurgitation - Previously used medication for these symptoms with good effect, but discontinued due to prior issues with the medication  Lower extremity stiffness and pain - Stiffness and pain in the leg after prolonged sitting - Improvement in symptoms with increased activity - Recently initiated walking every other day, approximately two miles per session, to improve leg condition - Awaiting MRI results       Review of Systems     Objective:   Physical Exam General-in no acute distress Eyes-no discharge Lungs-respiratory rate normal, CTA CV-no murmurs,RRR Throat no erythema neck no nodules felt No masses are felt      Assessment & Plan:  It is possible he may be having some subclinical reflux related symptoms Recommend Pepcid  40 mg 1 daily Patient has a follow-up wellness coming up he will let us  know how it is doing at that time  Patient inquired at a  later time if cherry extract could be taken with his medications.  In a search that I did on medical way I I have found no evidence that it does interact abnormally

## 2023-08-21 NOTE — Addendum Note (Signed)
 Addended by: ALPHONSA HAMILTON A on: 08/21/2023 09:08 PM   Modules accepted: Orders

## 2023-08-25 ENCOUNTER — Ambulatory Visit: Payer: Self-pay | Admitting: Family Medicine

## 2023-09-02 ENCOUNTER — Ambulatory Visit: Payer: Self-pay | Admitting: Family Medicine

## 2023-09-02 LAB — LIPID PANEL
Chol/HDL Ratio: 3.9 ratio (ref 0.0–5.0)
Cholesterol, Total: 152 mg/dL (ref 100–199)
HDL: 39 mg/dL — ABNORMAL LOW (ref >39)
LDL Chol Calc (NIH): 92 mg/dL (ref 0–99)
Triglycerides: 114 mg/dL (ref 0–149)
VLDL Cholesterol Cal: 21 mg/dL (ref 5–40)

## 2023-09-02 LAB — BASIC METABOLIC PANEL WITH GFR
BUN/Creatinine Ratio: 13 (ref 9–20)
BUN: 15 mg/dL (ref 6–24)
CO2: 22 mmol/L (ref 20–29)
Calcium: 9 mg/dL (ref 8.7–10.2)
Chloride: 107 mmol/L — ABNORMAL HIGH (ref 96–106)
Creatinine, Ser: 1.13 mg/dL (ref 0.76–1.27)
Glucose: 96 mg/dL (ref 70–99)
Potassium: 4.8 mmol/L (ref 3.5–5.2)
Sodium: 143 mmol/L (ref 134–144)
eGFR: 76 mL/min/1.73 (ref >59)

## 2023-09-02 LAB — PSA: Prostate Specific Ag, Serum: 0.5 ng/mL (ref 0.0–4.0)

## 2023-09-09 ENCOUNTER — Encounter: Payer: Self-pay | Admitting: Family Medicine

## 2023-09-09 ENCOUNTER — Ambulatory Visit: Payer: BC Managed Care – PPO | Admitting: Family Medicine

## 2023-09-09 VITALS — BP 118/72 | HR 71 | Temp 97.5°F | Ht 71.0 in | Wt 257.2 lb

## 2023-09-09 DIAGNOSIS — I10 Essential (primary) hypertension: Secondary | ICD-10-CM

## 2023-09-09 DIAGNOSIS — M5414 Radiculopathy, thoracic region: Secondary | ICD-10-CM | POA: Diagnosis not present

## 2023-09-09 DIAGNOSIS — Z0001 Encounter for general adult medical examination with abnormal findings: Secondary | ICD-10-CM

## 2023-09-09 DIAGNOSIS — E785 Hyperlipidemia, unspecified: Secondary | ICD-10-CM | POA: Diagnosis not present

## 2023-09-09 DIAGNOSIS — Z0000 Encounter for general adult medical examination without abnormal findings: Secondary | ICD-10-CM

## 2023-09-09 DIAGNOSIS — J392 Other diseases of pharynx: Secondary | ICD-10-CM

## 2023-09-09 MED ORDER — PREGABALIN 100 MG PO CAPS: ORAL_CAPSULE | ORAL | 5 refills | Status: DC

## 2023-09-09 MED ORDER — PANTOPRAZOLE SODIUM 40 MG PO TBEC: 40.0000 mg | DELAYED_RELEASE_TABLET | Freq: Every day | ORAL | 3 refills | Status: DC

## 2023-09-09 MED ORDER — SILDENAFIL CITRATE 50 MG PO TABS: 50.0000 mg | ORAL_TABLET | Freq: Every day | ORAL | 2 refills | Status: AC | PRN

## 2023-09-09 NOTE — Progress Notes (Signed)
   Subjective:    Patient ID: Justin Estrada, male    DOB: [birth year], 58 y.o.   MRN: [id number]  HPI The patient comes in today for a wellness visit.  Here today for wellness Overall doing well Still has a tickling cough sensation in his throat intermittent famotidine  did not help Patient also has some increased burning pain and discomfort in his back his MRI did show nerve root impingement  A review of their health history was completed.  A review of medications was also completed.  Any needed refills; Pregabalin   Eating habits: Good  Falls/  MVA accidents in past few months: No  Regular exercise: Walk mornings  Specialist pt sees on regular basis: Cardio, GI, Derm  Preventative health issues were discussed.   Additional concerns: None at this time.   Review of Systems     Objective:   Physical Exam  General-in no acute distress Eyes-no discharge Lungs-respiratory rate normal, CTA CV-no murmurs,RRR Extremities skin warm dry no edema Neuro grossly normal Behavior normal, alert Prostate soft slightly enlarged not abnormal      Assessment & Plan:   1. Well adult exam (Primary) Adult wellness-complete.wellness physical was conducted today. Importance of diet and exercise were discussed in detail.  Importance of stress reduction and healthy living were discussed.  In addition to this a discussion regarding safety was also covered.  We also reviewed over immunizations and gave recommendations regarding current immunization needed for age.   In addition to this additional areas were also touched on including: Preventative health exams needed:  Colonoscopy 2028  Patient was advised yearly wellness exam   2. Thoracic nerve root impingement Bump up dose of Lyrica  100 mg twice daily patient will give us  feedback if it causes drowsiness let us  know and we would back down on the medicine do not drive if drowsy  3. Essential hypertension Blood pressure good  control continue current measures  4. Hyperlipidemia, unspecified hyperlipidemia type Cholesterol under good control continue current measures  5. Throat irritation Changed to PPI if this does not dramatically help things over the course the next 2 weeks referral to ENT  Erectile dysfunction half a tablet of Viagra  as needed follow-up if progressive troubles or problems  Recheck 6 months

## 2023-10-01 ENCOUNTER — Encounter: Payer: Self-pay | Admitting: Family Medicine

## 2023-10-02 ENCOUNTER — Other Ambulatory Visit: Payer: Self-pay

## 2023-10-02 DIAGNOSIS — J392 Other diseases of pharynx: Secondary | ICD-10-CM

## 2023-10-02 DIAGNOSIS — R059 Cough, unspecified: Secondary | ICD-10-CM

## 2023-10-02 NOTE — Telephone Encounter (Signed)
 Nurses Go ahead with consultation with ENT Dr. Tobie MOCCASIN he does good work. Reason-throat irritation-cough  Please also send notification to Krystal that the referral has been initiated-the ENT office should connect with him directly to schedule this thanks-Dr. Glendia

## 2023-10-09 ENCOUNTER — Ambulatory Visit

## 2023-10-09 NOTE — Progress Notes (Signed)
 Patient presents today to pick up custom molded foot orthotics, diagnosed with PF by Dr. Magdalen.   Orthotics were dispensed and fit was satisfactory. Reviewed instructions for break-in and wear. Written instructions given to patient.  Patient will follow up as needed.   Lolita Schultze Cped, CFo, CFm

## 2023-10-21 ENCOUNTER — Encounter: Payer: Self-pay | Admitting: Family Medicine

## 2023-10-28 ENCOUNTER — Other Ambulatory Visit: Payer: Self-pay

## 2023-11-30 ENCOUNTER — Telehealth: Payer: Self-pay

## 2023-11-30 NOTE — Telephone Encounter (Signed)
 Pt LVM would like to order a 2nd pr of orthotics. Pt stated in VM that ins covers a second pair.  I called pt back and LVM ill let him know when/if the orthotics are ordered and when they are in

## 2023-12-09 ENCOUNTER — Encounter (INDEPENDENT_AMBULATORY_CARE_PROVIDER_SITE_OTHER): Payer: Self-pay | Admitting: Gastroenterology

## 2023-12-10 ENCOUNTER — Encounter (INDEPENDENT_AMBULATORY_CARE_PROVIDER_SITE_OTHER): Payer: Self-pay | Admitting: Otolaryngology

## 2023-12-10 ENCOUNTER — Ambulatory Visit (INDEPENDENT_AMBULATORY_CARE_PROVIDER_SITE_OTHER): Admitting: Otolaryngology

## 2023-12-10 VITALS — BP 149/85 | HR 70 | Ht 71.0 in | Wt 250.0 lb

## 2023-12-10 DIAGNOSIS — J342 Deviated nasal septum: Secondary | ICD-10-CM

## 2023-12-10 DIAGNOSIS — R053 Chronic cough: Secondary | ICD-10-CM | POA: Diagnosis not present

## 2023-12-10 DIAGNOSIS — R0982 Postnasal drip: Secondary | ICD-10-CM

## 2023-12-10 DIAGNOSIS — K219 Gastro-esophageal reflux disease without esophagitis: Secondary | ICD-10-CM

## 2023-12-10 DIAGNOSIS — R070 Pain in throat: Secondary | ICD-10-CM | POA: Diagnosis not present

## 2023-12-10 DIAGNOSIS — J3089 Other allergic rhinitis: Secondary | ICD-10-CM

## 2023-12-10 DIAGNOSIS — R0981 Nasal congestion: Secondary | ICD-10-CM

## 2023-12-10 DIAGNOSIS — J343 Hypertrophy of nasal turbinates: Secondary | ICD-10-CM

## 2023-12-10 NOTE — Patient Instructions (Signed)

## 2023-12-10 NOTE — Progress Notes (Signed)
 ENT CONSULT:  Reason for Consult: chronic throat discomfort    HPI: Discussed the use of AI scribe software for clinical note transcription with the patient, who gave verbal consent to proceed.  History of Present Illness 44 yoM with hx of chronic persistent throat discomfort and dry cough. He reports that his could has improved since initial sx onset several months ago. He is on Protonix  for reflux and takes it every morning. He denies pain with swallowing or trouble with swallowing. Denies allergies and was never tested before. Denies voice changes.   Records Reviewed:  PCP office visit 09/09/23 Throat irritation Changed to PPI if this does not dramatically help things over the course the next 2 weeks referral to ENT      Past Medical History:  Diagnosis Date   Anal fissure    History of cardiac catheterization    Normal coronary arteries January 2021    Past Surgical History:  Procedure Laterality Date   BIOPSY  10/12/2017   Procedure: BIOPSY;  Surgeon: Golda Claudis PENNER, MD;  Location: [facility];  Service: Endoscopy;;  antral biopsy    COLONOSCOPY     10 plus years ago by Dr.Rourk   COLONOSCOPY N/A 07/28/2016   Rehman: anal tags internal hemorrhoids   ESOPHAGOGASTRODUODENOSCOPY N/A 10/12/2017   Rehman chest/epigastric pain, prepyloric scar and gastritis, biopsy neg for H pylori.   EXCISION OF SKIN TAG N/A 09/30/2017   Procedure: EXCISION OF PERIANAL SKIN TAGS;  Surgeon: Kallie Manuelita BROCKS, MD;  Location: [facility];  Service: General;  Laterality: N/A;   foot surgery  for bone spurs Bilateral    LEFT HEART CATH AND CORONARY ANGIOGRAPHY N/A 03/11/2019   Procedure: LEFT HEART CATH AND CORONARY ANGIOGRAPHY;  Surgeon: Claudene Victory ORN, MD;  Location: [facility];  Service: Cardiovascular;  Laterality: N/A;   UPPER GASTROINTESTINAL ENDOSCOPY     10 plus years ago by Dr.Rourk    Family History  Problem Relation Age of Onset   Hypertension Mother    Lung cancer Mother     Liver cancer Mother    Hypertension Father    Congestive Heart Failure Father    Atrial fibrillation Brother     Social History:  reports that he has never smoked. He has never used smokeless tobacco. He reports that he does not drink alcohol and does not use drugs.  Allergies: No Known Allergies  Medications: I have reviewed the patient's current medications.  The PMH, PSH, Medications, Allergies, and SH were reviewed and updated.  ROS: Constitutional: Negative for fever, weight loss and weight gain. Cardiovascular: Negative for chest pain and dyspnea on exertion. Respiratory: Is not experiencing shortness of breath at rest. Gastrointestinal: Negative for nausea and vomiting. Neurological: Negative for headaches. Psychiatric: The patient is not nervous/anxious  Blood pressure (!) 149/85, pulse 70, height 5' 11 (1.803 m), weight 250 lb (113.4 kg), SpO2 95%. Body mass index is 34.87 kg/m.  PHYSICAL EXAM:  Exam: General: Well-developed, well-nourished Communication and Voice: Clear pitch and clarity Respiratory Respiratory effort: Equal inspiration and expiration without stridor Cardiovascular Peripheral Vascular: Warm extremities with equal color/perfusion Eyes: No nystagmus with equal extraocular motion bilaterally Neuro/Psych/Balance: Patient oriented to person, place, and time; Appropriate mood and affect; Gait is intact with no imbalance; Cranial nerves I-XII are intact Head and Face Inspection: Normocephalic and atraumatic without mass or lesion Palpation: Facial skeleton intact without bony stepoffs Salivary Glands: No mass or tenderness Facial Strength: Facial motility symmetric and full bilaterally ENT  Pinna: External ear intact and fully developed External canal: Canal is patent with intact skin Tympanic Membrane: Clear and mobile External Nose: No scar or anatomic deformity Internal Nose: Septum is straight. No polyp, or purulence. Mucosal edema and erythema  present.  Bilateral inferior turbinate hypertrophy.  Lips, Teeth, and gums: Mucosa and teeth intact and viable TMJ: No pain to palpation with full mobility Oral cavity/oropharynx: No erythema or exudate, no lesions present Nasopharynx: No mass or lesion with intact mucosa Hypopharynx: Intact mucosa without pooling of secretions Larynx Glottic: Full true vocal cord mobility without lesion or mass Supraglottic: Normal appearing epiglottis and AE folds Interarytenoid Space: Moderate pachydermia&edema Subglottic Space: Patent without lesion or edema Neck Neck and Trachea: Midline trachea without mass or lesion Thyroid: No mass or nodularity Lymphatics: No lymphadenopathy  Procedure: Preoperative diagnosis: chronic throat irritation and cough   Postoperative diagnosis:   Same  Procedure: Flexible fiberoptic laryngoscopy  Surgeon: Elena Larry, MD  Anesthesia: Topical lidocaine  and Afrin Complications: None Condition is stable throughout exam  Indications and consent:  The patient presents to the clinic with above symptoms. Indirect laryngoscopy view was incomplete. Thus it was recommended that they undergo a flexible fiberoptic laryngoscopy. All of the risks, benefits, and potential complications were reviewed with the patient preoperatively and verbal informed consent was obtained.  Procedure: The patient was seated upright in the clinic. Topical lidocaine  and Afrin were applied to the nasal cavity. After adequate anesthesia had occurred, I then proceeded to pass the flexible telescope into the nasal cavity. The nasal cavity was patent without rhinorrhea or polyp. The nasopharynx was also patent without mass or lesion. The base of tongue was visualized and was normal. There were no signs of pooling of secretions in the piriform sinuses. The true vocal folds were mobile bilaterally. There were no signs of glottic or supraglottic mucosal lesion or mass. There was moderate interarytenoid  pachydermia and post cricoid edema. The telescope was then slowly withdrawn and the patient tolerated the procedure throughout.    Studies Reviewed: CXR 07/2022 FINDINGS: Cardiomediastinal silhouette unchanged in size and contour. No evidence of central vascular congestion. No interlobular septal thickening.   Low lung volumes persist.   No pneumothorax or pleural effusion. Coarsened interstitial markings, with no confluent airspace disease.   No acute displaced fracture. Degenerative changes of the spine.   IMPRESSION: No active cardiopulmonary disease.  Assessment/Plan: Encounter Diagnoses  Name Primary?   Chronic nasal congestion Yes   Environmental and seasonal allergies    Post-nasal drip    Hypertrophy of both inferior nasal turbinates    Nasal septal deviation    Chronic GERD    Throat discomfort [R07.0]     Assessment and Plan Assessment & Plan Chronic throat discomfort Flexible scope and oropharyngeal exam were normal. He has hx of GERD and there were changes c/w GERD LPR on scope exam. We also discussed that post-nasal drainage can cause his sx.  - medical management of GERD and post-nasal drainage.   GERD LPR - continue Protonix   -  Reflux Gourmet after meals - diet and lifestyle changes to minimize GERD - Refer to [employer] blog for dietary and lifestyle modifications/reflux cook book  Chronic nasal congestion and post-nasal drainage suspected environmental allergies Evidence of post-nasal drainage during flexible scope exam today, could be contributing to her sx - trial of Xyzal 5 mg daily and Flonase  2 puffs b/l nares BID - consider nasal saline rinses   Thank you for allowing me to participate  in the care of this patient. Please do not hesitate to contact me with any questions or concerns.   Elena Larry, MD Otolaryngology [facility] Phone: [phone] Fax: [phone]    12/10/2023, 1:51 PM

## 2023-12-10 NOTE — Progress Notes (Signed)
 Patient is having BP managed.

## 2023-12-24 ENCOUNTER — Telehealth: Payer: Self-pay | Admitting: Family Medicine

## 2023-12-24 NOTE — Telephone Encounter (Signed)
 Refill on   valsartan  (DIOVAN ) 80 MG table   [facility] pharmacy

## 2023-12-25 ENCOUNTER — Other Ambulatory Visit: Payer: Self-pay

## 2023-12-25 MED ORDER — VALSARTAN 80 MG PO TABS: 80.0000 mg | ORAL_TABLET | Freq: Every day | ORAL | 1 refills | Status: DC

## 2023-12-28 ENCOUNTER — Other Ambulatory Visit: Payer: Self-pay

## 2023-12-28 MED ORDER — VALSARTAN 80 MG PO TABS
80.0000 mg | ORAL_TABLET | Freq: Every day | ORAL | 1 refills | Status: AC
Start: 2023-12-28 — End: ?

## 2023-12-28 NOTE — Telephone Encounter (Signed)
 I refilled his medication 12/28/2023

## 2024-01-04 ENCOUNTER — Ambulatory Visit: Admitting: Podiatry

## 2024-01-04 ENCOUNTER — Ambulatory Visit (INDEPENDENT_AMBULATORY_CARE_PROVIDER_SITE_OTHER)

## 2024-01-04 ENCOUNTER — Encounter: Payer: Self-pay | Admitting: Podiatry

## 2024-01-04 VITALS — Ht 71.0 in | Wt 250.0 lb

## 2024-01-04 DIAGNOSIS — M722 Plantar fascial fibromatosis: Secondary | ICD-10-CM

## 2024-01-04 MED ORDER — TRIAMCINOLONE ACETONIDE 10 MG/ML IJ SUSP
10.0000 mg | Freq: Once | INTRAMUSCULAR | Status: AC
  Administered 2024-01-04: 10 mg via INTRA_ARTICULAR

## 2024-01-05 NOTE — Progress Notes (Signed)
 Subjective:   Patient ID: Justin Estrada, male   DOB: 58 y.o.   MRN: [id number]   HPI Patient continues to experience quite a bit of discomfort on the right heel stating that he did have some improvement but it has been bothering him again and he has his orthotics it is worse after periods of sitting and when getting up in the morning   ROS      Objective:  Physical Exam  Continues to have inflammation into the right plantar fascia also distal into the arch with fluid buildup in this area     Assessment:  Chronic fascial inflammation right present     Plan:  H&P reviewed and at this point I went ahead sterile prep and I injected the fascia 3 mg Kenalog  5 mg Xylocaine  and I did dispense night splint properly fitted into the lower leg instructing on stretching exercises ice therapy and continued orthotic usage.  Reappoint 4 weeks to reevaluate  X-rays dated today indicated there is been no changes with the acute discomfort the patient is experiencing does have spur formation

## 2024-01-08 ENCOUNTER — Other Ambulatory Visit: Payer: Self-pay | Admitting: Family Medicine

## 2024-01-12 ENCOUNTER — Telehealth: Payer: Self-pay

## 2024-01-12 NOTE — Telephone Encounter (Signed)
 Orthotics in patient is ok to pick up patient was called  I will add charges when he picks up  Lolita Sheldon Merino

## 2024-01-13 NOTE — Telephone Encounter (Signed)
 Patient came in today to [facility].

## 2024-03-13 ENCOUNTER — Other Ambulatory Visit: Payer: Self-pay | Admitting: Family Medicine

## 2024-03-16 ENCOUNTER — Ambulatory Visit: Admitting: Family Medicine

## 2024-03-16 ENCOUNTER — Encounter: Payer: Self-pay | Admitting: Family Medicine

## 2024-03-16 VITALS — BP 136/72 | HR 69 | Temp 98.1°F | Ht 71.0 in | Wt 260.0 lb

## 2024-03-16 DIAGNOSIS — R200 Anesthesia of skin: Secondary | ICD-10-CM

## 2024-03-16 DIAGNOSIS — I10 Essential (primary) hypertension: Secondary | ICD-10-CM | POA: Diagnosis not present

## 2024-03-16 DIAGNOSIS — M5414 Radiculopathy, thoracic region: Secondary | ICD-10-CM | POA: Diagnosis not present

## 2024-03-16 DIAGNOSIS — R202 Paresthesia of skin: Secondary | ICD-10-CM | POA: Diagnosis not present

## 2024-03-16 DIAGNOSIS — M545 Low back pain, unspecified: Secondary | ICD-10-CM | POA: Diagnosis not present

## 2024-03-16 NOTE — Progress Notes (Signed)
" ° °  Subjective:    Patient ID: Justin Estrada, male    DOB: 1965/03/25, 59 y.o.   MRN: [id number]  HPI Patient in today for follow-up Patient does have history of high blood pressure Takes his medicine regular basis Blood pressure under decent control Works as a chartered loss adjuster Very busy schedule Unable to do much in the way of regular exercise Is trying to eat healthier Patient does take his acid blocker does a good job He utilizes Lyrica  for nerve impingement that seems to help In addition this he does have some tingling on the pads of his toes Previously we had mentioned the possibility of doing nerve conduction studies with propagate but has not    Review of Systems     Objective:   Physical Exam  General-in no acute distress Eyes-no discharge Lungs-respiratory rate normal, CTA CV-no murmurs,RRR Extremities skin warm dry no edema Neuro grossly normal Behavior normal, alert       Assessment & Plan:  Blood pressure good control continue current measures healthy diet  As weather improves try to fit him walking on a regular basis as tolerated by plantar fasciitis  Persistent low back discomfort.  Referral to [facility] for further evaluation  Intermittent impingement of the thoracic Lyrica  twice daily not causing any drowsiness  Wellness in the summertime with lab work  Numbness in the pads of the toes if it propagates I would recommend nerve conduction study but currently that is not indicated  Patient to do labs before follow-up visit  "

## 2024-03-17 ENCOUNTER — Encounter (INDEPENDENT_AMBULATORY_CARE_PROVIDER_SITE_OTHER): Payer: Self-pay | Admitting: *Deleted

## 2024-03-17 ENCOUNTER — Other Ambulatory Visit: Payer: Self-pay

## 2024-03-17 DIAGNOSIS — M545 Low back pain, unspecified: Secondary | ICD-10-CM

## 2024-03-31 ENCOUNTER — Ambulatory Visit: Admitting: Cardiology

## 2024-03-31 ENCOUNTER — Encounter: Payer: Self-pay | Admitting: Cardiology

## 2024-03-31 VITALS — BP 104/72 | HR 64 | Ht 71.0 in | Wt 262.0 lb

## 2024-03-31 DIAGNOSIS — I10 Essential (primary) hypertension: Secondary | ICD-10-CM

## 2024-03-31 NOTE — Progress Notes (Signed)
"  ° ° °  Cardiology Office Note  Date: 03/31/2024   ID: Justin Estrada, DOB 1965-07-24, MRN [id number]  History of Present Illness: Justin Estrada is a 59 y.o. male last seen in January 2025.  He is here for a routine visit.  He does not report any angina, has had some atypical left lateral thoracic discomfort that sounds neuropathic in description.  I see that he had an MRI of his thoracic spine per Dr. Alphonsa last year with some evidence of foraminal stenosis.  We went over his medications, blood pressure is well-controlled today on current regimen.  I reviewed his lab work which is noted below.  I reviewed his ECG today which shows normal sinus rhythm.  Physical Exam: VS:  BP 104/72 (BP Location: Left Arm, Patient Position: Sitting, Cuff Size: Normal)   Pulse 64   Ht 5' 11 (1.803 m)   Wt 262 lb (118.8 kg)   SpO2 99%   BMI 36.54 kg/m , BMI Body mass index is 36.54 kg/m.  Wt Readings from Last 3 Encounters:  03/31/24 262 lb (118.8 kg)  03/16/24 260 lb (117.9 kg)  01/04/24 250 lb (113.4 kg)    General: Patient appears comfortable at rest. HEENT: Conjunctiva and lids normal. Neck: Supple, no elevated JVP or carotid bruits. Lungs: Clear to auscultation, nonlabored breathing at rest. Cardiac: Regular rate and rhythm, no S3 or significant systolic murmur. Extremities: No pitting edema.  ECG:  An ECG dated 05/01/2023 was personally reviewed today and demonstrated:  Sinus rhythm.  Labwork: 05/01/2023: Hemoglobin 16.1; Platelets 350 05/13/2023: ALT 28; AST 18 09/01/2023: BUN 15; Creatinine, Ser 1.13; Potassium 4.8; Sodium 143     Component Value Date/Time   CHOL 152 09/01/2023 0833   TRIG 114 09/01/2023 0833   HDL 39 (L) 09/01/2023 0833   CHOLHDL 3.9 09/01/2023 0833   LDLCALC 92 09/01/2023 0833   Other Studies Reviewed Today:  No interval cardiac testing for review today.  Assessment and Plan:  1.  Normal coronary arteries at cardiac catheterization in 2021.  He does not describe any  angina and his ECG shows normal sinus rhythm.  Current thoracic symptoms sound more neuropathic in etiology.  He continues to follow regularly with Dr. Alphonsa.   2.  Primary hypertension.  Blood pressure well-controlled today on Diovan  80 mg daily.  Disposition:  Follow up 1 year at his request.  Signed, Justin Estrada, M.D., F.A.C.C. [facility] "

## 2024-03-31 NOTE — Patient Instructions (Addendum)

## 2024-09-16 ENCOUNTER — Encounter: Admitting: Family Medicine
